# Patient Record
Sex: Female | Born: 1935 | Race: White | Hispanic: No | Smoking: Never smoker
Health system: Southern US, Community
[De-identification: ages and names within clinical notes are randomized; demographics above are authoritative.]

## PROBLEM LIST (undated history)

## (undated) DIAGNOSIS — E119 Type 2 diabetes mellitus without complications: Secondary | ICD-10-CM

## (undated) DIAGNOSIS — E079 Disorder of thyroid, unspecified: Secondary | ICD-10-CM

## (undated) DIAGNOSIS — H409 Unspecified glaucoma: Secondary | ICD-10-CM

## (undated) DIAGNOSIS — I639 Cerebral infarction, unspecified: Secondary | ICD-10-CM

## (undated) DIAGNOSIS — C801 Malignant (primary) neoplasm, unspecified: Secondary | ICD-10-CM

## (undated) DIAGNOSIS — E785 Hyperlipidemia, unspecified: Secondary | ICD-10-CM

## (undated) DIAGNOSIS — E890 Postprocedural hypothyroidism: Secondary | ICD-10-CM

## (undated) DIAGNOSIS — M1990 Unspecified osteoarthritis, unspecified site: Secondary | ICD-10-CM

## (undated) DIAGNOSIS — Z9009 Acquired absence of other part of head and neck: Secondary | ICD-10-CM

## (undated) DIAGNOSIS — I10 Essential (primary) hypertension: Secondary | ICD-10-CM

## (undated) DIAGNOSIS — I4891 Unspecified atrial fibrillation: Secondary | ICD-10-CM

## (undated) DIAGNOSIS — I509 Heart failure, unspecified: Secondary | ICD-10-CM

## (undated) DIAGNOSIS — I499 Cardiac arrhythmia, unspecified: Secondary | ICD-10-CM

## (undated) HISTORY — PX: BREAST SURGERY: SHX581

## (undated) HISTORY — PX: ABDOMINAL HYSTERECTOMY: SHX81

---

## 1998-11-26 ENCOUNTER — Emergency Department (HOSPITAL_COMMUNITY): Admission: EM | Admit: 1998-11-26 | Discharge: 1998-11-26 | Payer: Self-pay | Admitting: Emergency Medicine

## 1999-06-19 ENCOUNTER — Ambulatory Visit (HOSPITAL_COMMUNITY): Admission: RE | Admit: 1999-06-19 | Discharge: 1999-06-19 | Payer: Self-pay | Admitting: Geriatric Medicine

## 1999-12-28 ENCOUNTER — Encounter: Payer: Self-pay | Admitting: Geriatric Medicine

## 1999-12-28 ENCOUNTER — Encounter: Admission: RE | Admit: 1999-12-28 | Discharge: 1999-12-28 | Payer: Self-pay | Admitting: Geriatric Medicine

## 2000-05-06 ENCOUNTER — Other Ambulatory Visit: Admission: RE | Admit: 2000-05-06 | Discharge: 2000-05-06 | Payer: Self-pay | Admitting: Geriatric Medicine

## 2000-12-30 ENCOUNTER — Encounter: Payer: Self-pay | Admitting: Geriatric Medicine

## 2000-12-30 ENCOUNTER — Encounter: Admission: RE | Admit: 2000-12-30 | Discharge: 2000-12-30 | Payer: Self-pay | Admitting: Geriatric Medicine

## 2001-04-09 ENCOUNTER — Emergency Department (HOSPITAL_COMMUNITY): Admission: EM | Admit: 2001-04-09 | Discharge: 2001-04-09 | Payer: Self-pay | Admitting: Emergency Medicine

## 2001-12-31 ENCOUNTER — Encounter: Admission: RE | Admit: 2001-12-31 | Discharge: 2001-12-31 | Payer: Self-pay | Admitting: Geriatric Medicine

## 2001-12-31 ENCOUNTER — Encounter: Payer: Self-pay | Admitting: Geriatric Medicine

## 2003-01-14 ENCOUNTER — Encounter: Payer: Self-pay | Admitting: Geriatric Medicine

## 2003-01-14 ENCOUNTER — Encounter: Admission: RE | Admit: 2003-01-14 | Discharge: 2003-01-14 | Payer: Self-pay | Admitting: Geriatric Medicine

## 2003-08-24 ENCOUNTER — Inpatient Hospital Stay (HOSPITAL_COMMUNITY): Admission: EM | Admit: 2003-08-24 | Discharge: 2003-08-24 | Payer: Self-pay | Admitting: Emergency Medicine

## 2003-08-24 ENCOUNTER — Encounter: Payer: Self-pay | Admitting: Internal Medicine

## 2003-10-12 ENCOUNTER — Ambulatory Visit (HOSPITAL_COMMUNITY): Admission: RE | Admit: 2003-10-12 | Discharge: 2003-10-12 | Payer: Self-pay | Admitting: Cardiology

## 2004-03-03 ENCOUNTER — Emergency Department (HOSPITAL_COMMUNITY): Admission: AD | Admit: 2004-03-03 | Discharge: 2004-03-03 | Payer: Self-pay | Admitting: Internal Medicine

## 2006-03-26 ENCOUNTER — Emergency Department (HOSPITAL_COMMUNITY): Admission: EM | Admit: 2006-03-26 | Discharge: 2006-03-26 | Payer: Self-pay | Admitting: Family Medicine

## 2006-06-18 ENCOUNTER — Ambulatory Visit: Payer: Self-pay | Admitting: Family Medicine

## 2006-06-20 ENCOUNTER — Ambulatory Visit: Payer: Self-pay | Admitting: Family Medicine

## 2006-07-16 ENCOUNTER — Ambulatory Visit: Payer: Self-pay | Admitting: Family Medicine

## 2006-09-15 ENCOUNTER — Ambulatory Visit: Payer: Self-pay | Admitting: Family Medicine

## 2006-12-16 ENCOUNTER — Ambulatory Visit: Payer: Self-pay | Admitting: Family Medicine

## 2006-12-16 LAB — CONVERTED CEMR LAB
AST: 18 units/L (ref 0–37)
Chloride: 104 meq/L (ref 96–112)
GFR calc non Af Amer: 63 mL/min
Glucose, Bld: 133 mg/dL — ABNORMAL HIGH (ref 70–99)
HDL: 54.8 mg/dL (ref >39.0)
Potassium: 4.1 meq/L (ref 3.5–5.1)
Total CHOL/HDL Ratio: 3.4

## 2006-12-27 DIAGNOSIS — E039 Hypothyroidism, unspecified: Secondary | ICD-10-CM | POA: Insufficient documentation

## 2006-12-27 DIAGNOSIS — J309 Allergic rhinitis, unspecified: Secondary | ICD-10-CM | POA: Insufficient documentation

## 2006-12-27 DIAGNOSIS — I10 Essential (primary) hypertension: Secondary | ICD-10-CM | POA: Insufficient documentation

## 2006-12-27 DIAGNOSIS — E785 Hyperlipidemia, unspecified: Secondary | ICD-10-CM | POA: Insufficient documentation

## 2007-02-17 ENCOUNTER — Ambulatory Visit: Payer: Self-pay | Admitting: Family Medicine

## 2007-02-17 LAB — CONVERTED CEMR LAB: Glucose, Bld: 113 mg/dL — ABNORMAL HIGH (ref 70–99)

## 2007-03-19 ENCOUNTER — Ambulatory Visit (HOSPITAL_COMMUNITY): Admission: RE | Admit: 2007-03-19 | Discharge: 2007-03-19 | Payer: Self-pay | Admitting: Specialist

## 2007-04-14 ENCOUNTER — Ambulatory Visit: Payer: Self-pay | Admitting: Family Medicine

## 2007-04-14 ENCOUNTER — Encounter: Payer: Self-pay | Admitting: Family Medicine

## 2007-04-14 ENCOUNTER — Encounter (INDEPENDENT_AMBULATORY_CARE_PROVIDER_SITE_OTHER): Payer: Self-pay | Admitting: Family Medicine

## 2007-04-14 ENCOUNTER — Encounter: Admission: RE | Admit: 2007-04-14 | Discharge: 2007-04-14 | Payer: Self-pay | Admitting: Family Medicine

## 2007-04-14 DIAGNOSIS — M79609 Pain in unspecified limb: Secondary | ICD-10-CM | POA: Insufficient documentation

## 2007-04-15 ENCOUNTER — Telehealth (INDEPENDENT_AMBULATORY_CARE_PROVIDER_SITE_OTHER): Payer: Self-pay | Admitting: *Deleted

## 2007-04-29 ENCOUNTER — Encounter: Admission: RE | Admit: 2007-04-29 | Discharge: 2007-04-29 | Payer: Self-pay | Admitting: Family Medicine

## 2007-05-21 ENCOUNTER — Ambulatory Visit: Payer: Self-pay | Admitting: Family Medicine

## 2007-05-21 DIAGNOSIS — E119 Type 2 diabetes mellitus without complications: Secondary | ICD-10-CM

## 2007-05-21 DIAGNOSIS — I2510 Atherosclerotic heart disease of native coronary artery without angina pectoris: Secondary | ICD-10-CM | POA: Insufficient documentation

## 2007-05-24 LAB — CONVERTED CEMR LAB
ALT: 14 units/L (ref 0–40)
Calcium: 9.3 mg/dL (ref 8.4–10.5)
Chloride: 107 meq/L (ref 96–112)
Cholesterol: 191 mg/dL (ref 0–200)
GFR calc Af Amer: 102 mL/min
Hgb A1c MFr Bld: 6.2 % — ABNORMAL HIGH (ref 4.6–6.0)
TSH: 1.21 microintl units/mL (ref 0.35–5.50)
Total CHOL/HDL Ratio: 3.2
Triglycerides: 173 mg/dL — ABNORMAL HIGH (ref 0–149)

## 2007-05-25 ENCOUNTER — Telehealth (INDEPENDENT_AMBULATORY_CARE_PROVIDER_SITE_OTHER): Payer: Self-pay | Admitting: *Deleted

## 2007-06-10 ENCOUNTER — Ambulatory Visit: Payer: Self-pay | Admitting: Cardiology

## 2007-07-01 ENCOUNTER — Ambulatory Visit: Payer: Self-pay

## 2007-07-01 ENCOUNTER — Encounter: Payer: Self-pay | Admitting: Cardiology

## 2007-07-16 ENCOUNTER — Inpatient Hospital Stay (HOSPITAL_COMMUNITY): Admission: EM | Admit: 2007-07-16 | Discharge: 2007-07-17 | Payer: Self-pay | Admitting: Emergency Medicine

## 2007-07-16 ENCOUNTER — Ambulatory Visit: Payer: Self-pay | Admitting: Internal Medicine

## 2007-07-17 ENCOUNTER — Telehealth (INDEPENDENT_AMBULATORY_CARE_PROVIDER_SITE_OTHER): Payer: Self-pay | Admitting: *Deleted

## 2007-07-20 ENCOUNTER — Telehealth (INDEPENDENT_AMBULATORY_CARE_PROVIDER_SITE_OTHER): Payer: Self-pay | Admitting: *Deleted

## 2007-07-20 ENCOUNTER — Ambulatory Visit: Payer: Self-pay | Admitting: Family Medicine

## 2007-07-20 LAB — CONVERTED CEMR LAB: Rapid Strep: NEGATIVE

## 2007-07-28 ENCOUNTER — Ambulatory Visit: Payer: Self-pay | Admitting: Family Medicine

## 2007-07-28 DIAGNOSIS — R55 Syncope and collapse: Secondary | ICD-10-CM | POA: Insufficient documentation

## 2007-08-06 ENCOUNTER — Ambulatory Visit: Payer: Self-pay | Admitting: Cardiology

## 2007-08-06 ENCOUNTER — Ambulatory Visit: Payer: Self-pay | Admitting: Internal Medicine

## 2007-08-06 ENCOUNTER — Telehealth (INDEPENDENT_AMBULATORY_CARE_PROVIDER_SITE_OTHER): Payer: Self-pay | Admitting: Family Medicine

## 2007-08-06 ENCOUNTER — Observation Stay (HOSPITAL_COMMUNITY): Admission: EM | Admit: 2007-08-06 | Discharge: 2007-08-07 | Payer: Self-pay | Admitting: Emergency Medicine

## 2007-08-10 ENCOUNTER — Ambulatory Visit: Payer: Self-pay | Admitting: Cardiology

## 2007-09-03 ENCOUNTER — Ambulatory Visit: Payer: Self-pay | Admitting: Cardiology

## 2007-09-05 ENCOUNTER — Ambulatory Visit: Payer: Self-pay | Admitting: Cardiology

## 2007-09-17 ENCOUNTER — Ambulatory Visit: Payer: Self-pay | Admitting: Cardiology

## 2008-01-05 ENCOUNTER — Ambulatory Visit: Payer: Self-pay | Admitting: Cardiology

## 2008-01-19 ENCOUNTER — Encounter (INDEPENDENT_AMBULATORY_CARE_PROVIDER_SITE_OTHER): Payer: Self-pay | Admitting: Family Medicine

## 2008-10-30 IMAGING — CR DG CHEST 2V
2 series · 2 of 2 positions shown · non-contrast
Comparison: 03/17/07.

CLINICAL DATA: 88-year-old female, syncope.  No chest complaints.  
 CHEST - 2 VIEW:

[w chest lat]
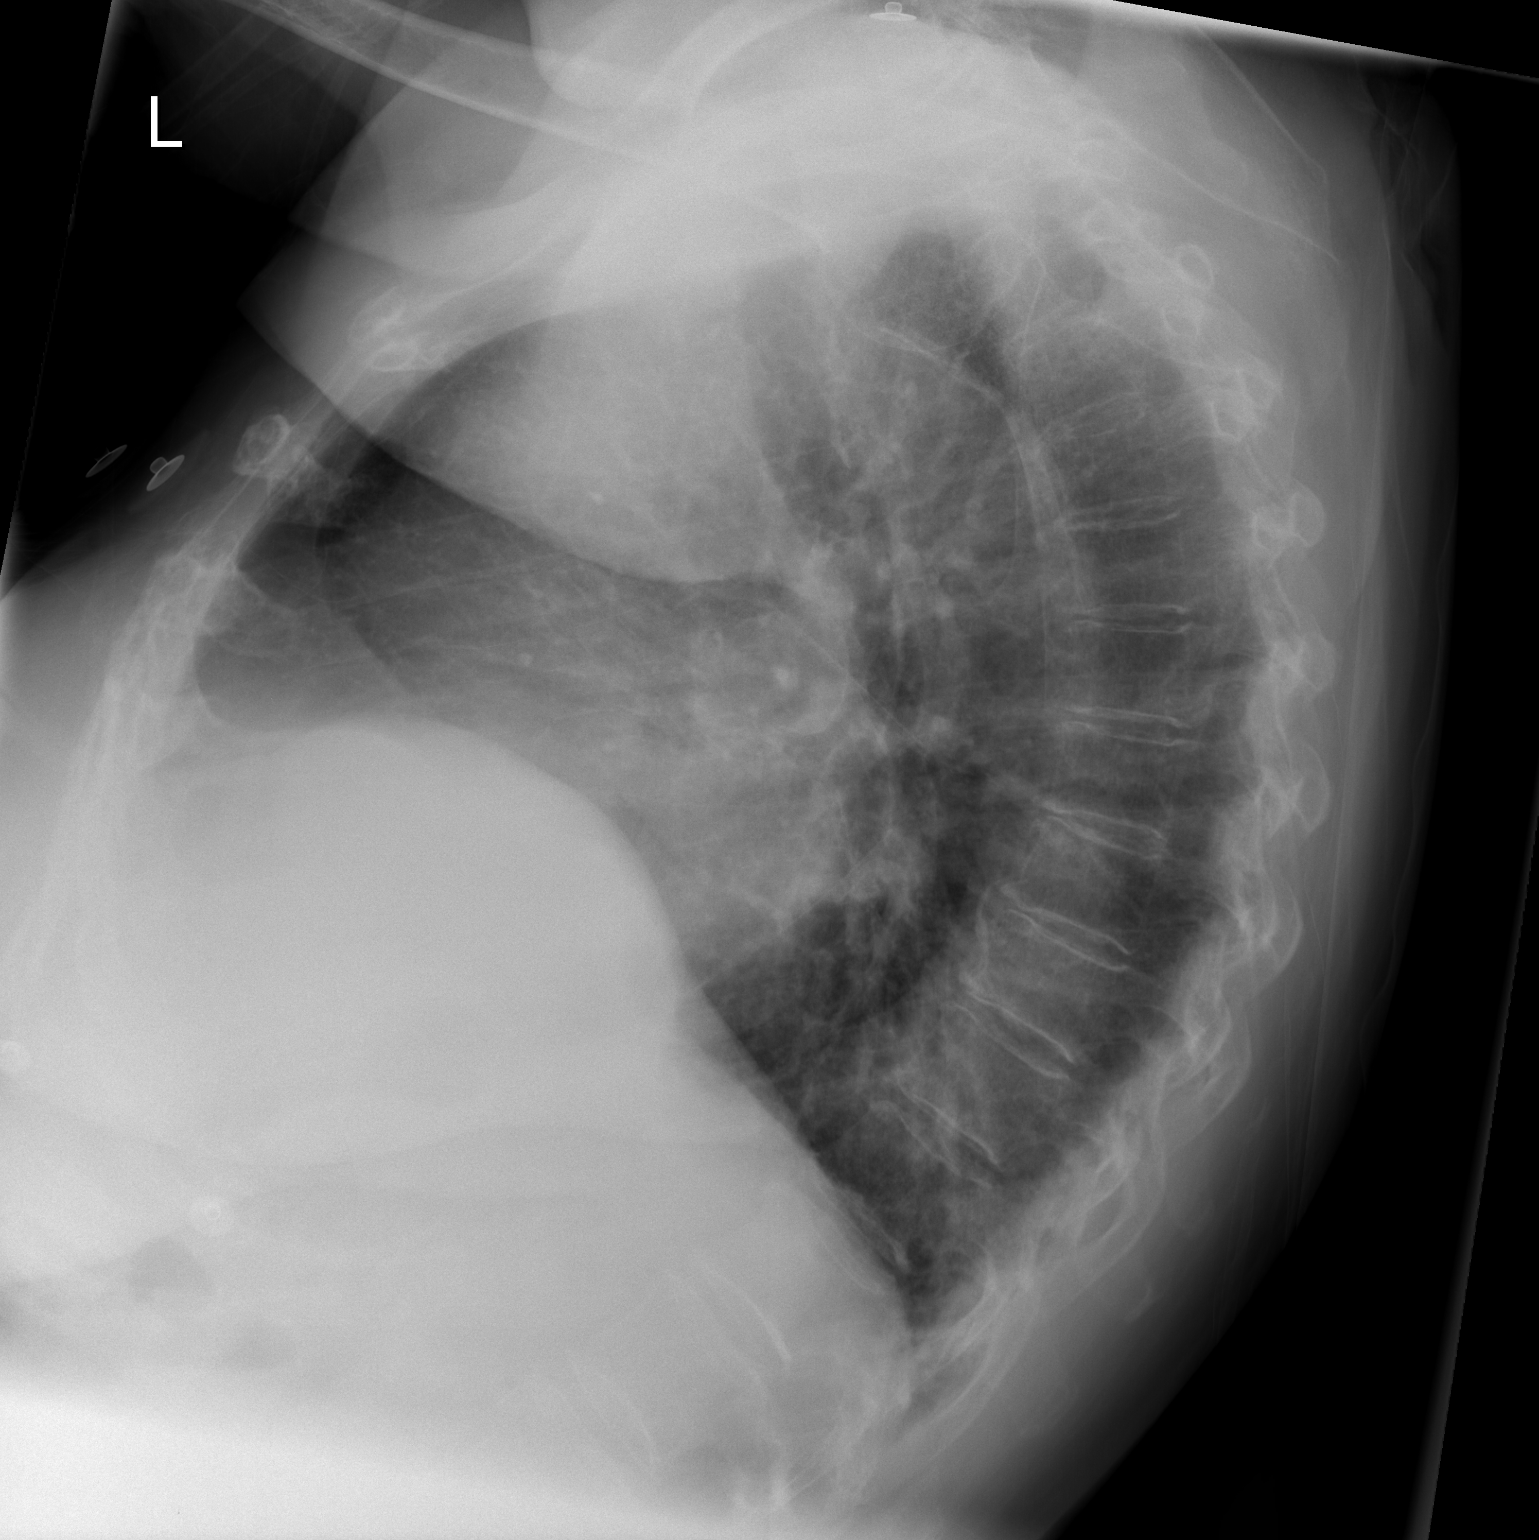

[view not recorded]
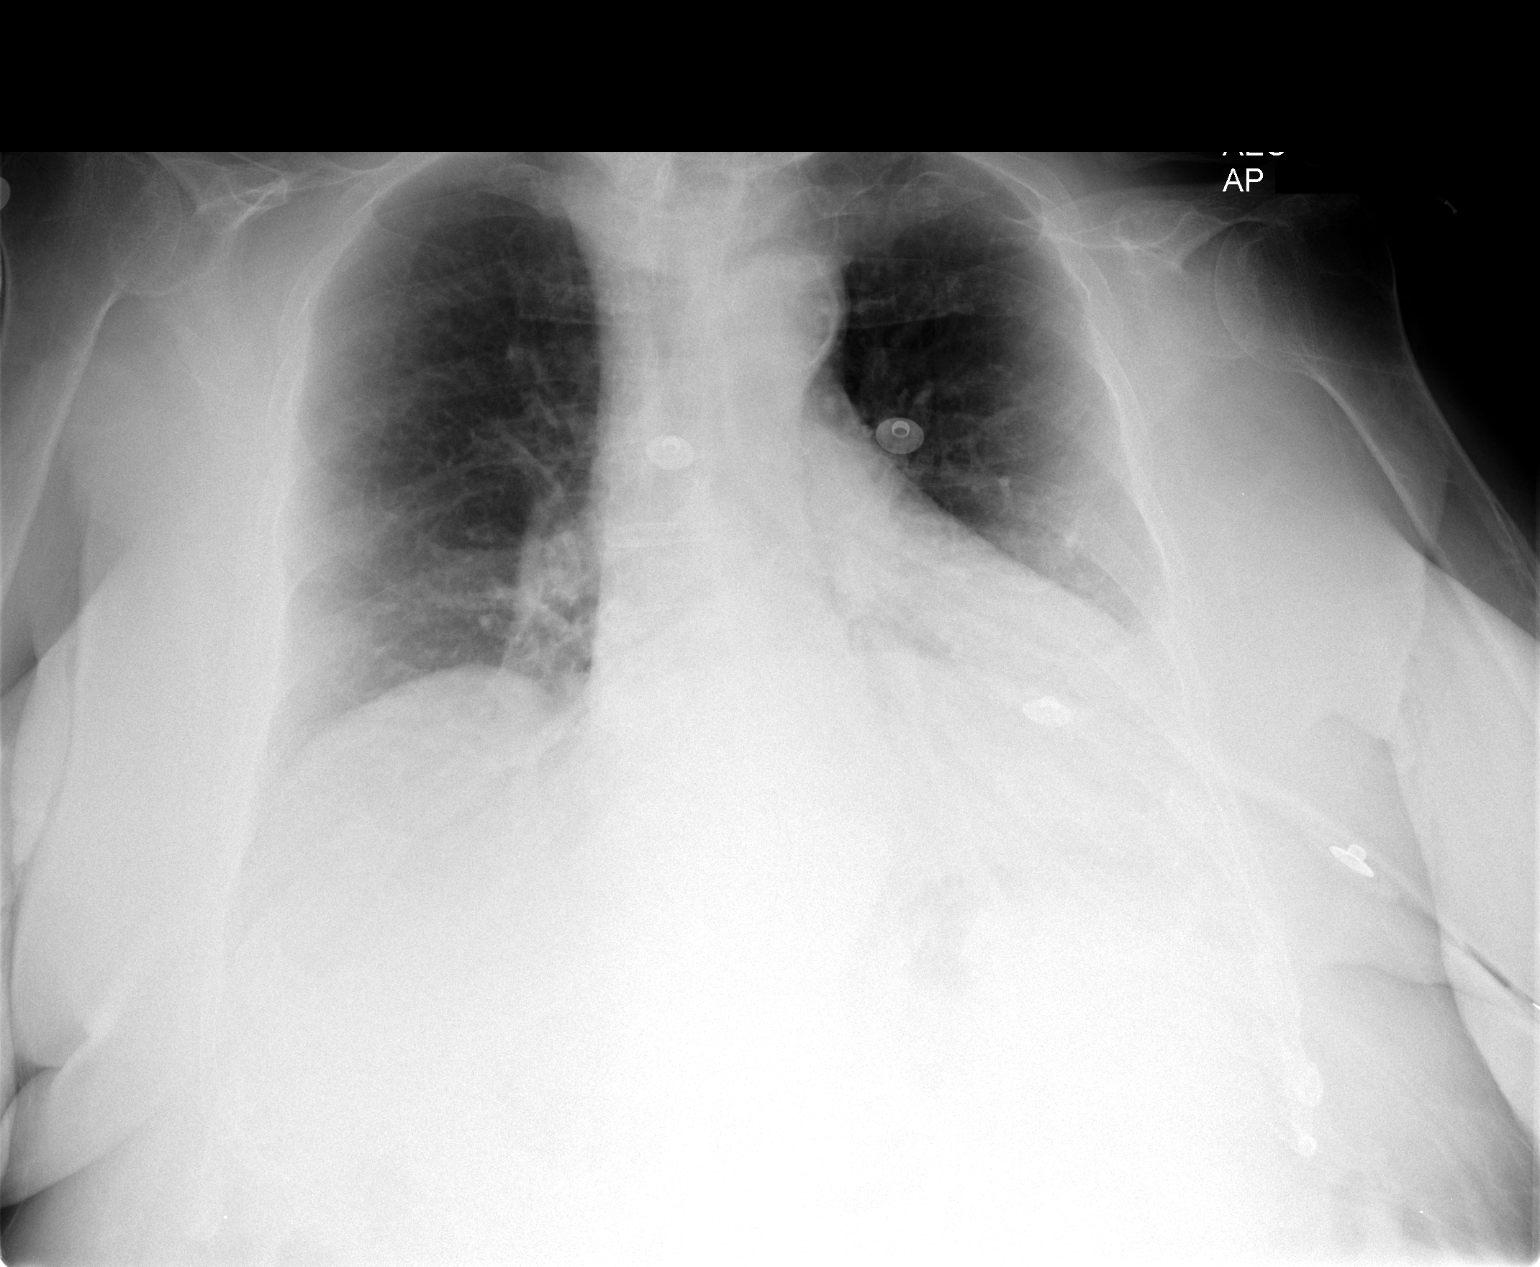

[2 of 2 positions shown; findings below may reference images not displayed]

FINDINGS: The PA view is extremely lordotic.  Heart size is enlarged.  Mild chronic interstitial changes are seen.  Changes of COPD are noted.  No focal airspace disease is evident.
IMPRESSION: 1.  Lordotic chest radiograph without focal airspace disease.  
 2.  Stable cardiomegaly without failure.

## 2010-01-31 ENCOUNTER — Encounter: Admission: RE | Admit: 2010-01-31 | Discharge: 2010-05-01 | Payer: Self-pay | Admitting: Ophthalmology

## 2010-07-14 ENCOUNTER — Emergency Department (HOSPITAL_COMMUNITY): Admission: EM | Admit: 2010-07-14 | Discharge: 2010-07-14 | Payer: Self-pay | Admitting: Emergency Medicine

## 2010-07-23 ENCOUNTER — Emergency Department (HOSPITAL_COMMUNITY)
Admission: EM | Admit: 2010-07-23 | Discharge: 2010-07-23 | Payer: Self-pay | Source: Home / Self Care | Admitting: Emergency Medicine

## 2010-08-22 ENCOUNTER — Emergency Department (HOSPITAL_COMMUNITY): Admission: EM | Admit: 2010-08-22 | Discharge: 2010-08-22 | Payer: Self-pay | Admitting: Emergency Medicine

## 2010-11-19 ENCOUNTER — Emergency Department (HOSPITAL_COMMUNITY)
Admission: EM | Admit: 2010-11-19 | Discharge: 2010-11-19 | Payer: Self-pay | Source: Home / Self Care | Admitting: Emergency Medicine

## 2011-02-11 LAB — CBC
HCT: 41.8 % (ref 36.0–46.0)
MCH: 30.8 pg (ref 26.0–34.0)
MCV: 92.1 fL (ref 78.0–100.0)
WBC: 10.3 10*3/uL (ref 4.0–10.5)

## 2011-02-11 LAB — DIFFERENTIAL
Lymphocytes Relative: 7 % — ABNORMAL LOW (ref 12–46)
Monocytes Relative: 7 % (ref 3–12)

## 2011-02-11 LAB — URINALYSIS, ROUTINE W REFLEX MICROSCOPIC
Bilirubin Urine: NEGATIVE
Glucose, UA: NEGATIVE mg/dL
Hgb urine dipstick: NEGATIVE
Protein, ur: NEGATIVE mg/dL
Specific Gravity, Urine: 1.03 (ref 1.005–1.030)
Urobilinogen, UA: 0.2 mg/dL (ref 0.0–1.0)

## 2011-02-11 LAB — POCT CARDIAC MARKERS
CKMB, poc: <1 ng/mL — ABNORMAL LOW (ref 1.0–8.0)
Myoglobin, poc: 67.5 ng/mL (ref 12–200)
Troponin i, poc: <0.05 ng/mL (ref 0.00–0.09)

## 2011-02-11 LAB — URINE CULTURE
Colony Count: 1000
Culture  Setup Time: 201112200152

## 2011-02-11 LAB — BASIC METABOLIC PANEL
BUN: 19 mg/dL (ref 6–23)
CO2: 26 mEq/L (ref 19–32)
Creatinine, Ser: 0.73 mg/dL (ref 0.4–1.2)
GFR calc Af Amer: >60 mL/min (ref >60)
Potassium: 4.1 mEq/L (ref 3.5–5.1)

## 2011-02-11 LAB — URINE MICROSCOPIC-ADD ON

## 2011-02-14 LAB — DIFFERENTIAL
Basophils Absolute: 0.1 10*3/uL (ref 0.0–0.1)
Basophils Relative: 1 % (ref 0–1)
Basophils Relative: 1 % (ref 0–1)
Eosinophils Absolute: 0.2 10*3/uL (ref 0.0–0.7)
Eosinophils Absolute: 0.2 10*3/uL (ref 0.0–0.7)
Eosinophils Relative: 2 % (ref 0–5)
Eosinophils Relative: 2 % (ref 0–5)
Lymphocytes Relative: 25 % (ref 12–46)
Monocytes Absolute: 0.6 10*3/uL (ref 0.1–1.0)
Monocytes Absolute: 0.8 10*3/uL (ref 0.1–1.0)
Monocytes Relative: 8 % (ref 3–12)

## 2011-02-14 LAB — CBC
HCT: 42.1 % (ref 36.0–46.0)
HCT: 39.8 % (ref 36.0–46.0)
Hemoglobin: 14.1 g/dL (ref 12.0–15.0)
MCH: 30.6 pg (ref 26.0–34.0)
MCH: 30.3 pg (ref 26.0–34.0)
MCHC: 33.5 g/dL (ref 30.0–36.0)
MCHC: 33.7 g/dL (ref 30.0–36.0)
MCV: 91.3 fL (ref 78.0–100.0)
MCV: 90 fL (ref 78.0–100.0)
Platelets: 178 10*3/uL (ref 150–400)
RDW: 13 % (ref 11.5–15.5)

## 2011-02-14 LAB — BASIC METABOLIC PANEL
BUN: 15 mg/dL (ref 6–23)
CO2: 24 mEq/L (ref 19–32)
Chloride: 109 mEq/L (ref 96–112)
Creatinine, Ser: 0.64 mg/dL (ref 0.4–1.2)
Glucose, Bld: 112 mg/dL — ABNORMAL HIGH (ref 70–99)
Potassium: 3.7 mEq/L (ref 3.5–5.1)

## 2011-02-14 LAB — URINE CULTURE: Culture  Setup Time: 201108222324

## 2011-02-14 LAB — URINALYSIS, ROUTINE W REFLEX MICROSCOPIC
Bilirubin Urine: NEGATIVE
Bilirubin Urine: NEGATIVE
Glucose, UA: NEGATIVE mg/dL
Hgb urine dipstick: NEGATIVE
Hgb urine dipstick: NEGATIVE
Ketones, ur: NEGATIVE mg/dL
Ketones, ur: NEGATIVE mg/dL
Nitrite: NEGATIVE
Nitrite: NEGATIVE
Specific Gravity, Urine: 1.007 (ref 1.005–1.030)
Specific Gravity, Urine: 1.011 (ref 1.005–1.030)
Urobilinogen, UA: 0.2 mg/dL (ref 0.0–1.0)
pH: 5 (ref 5.0–8.0)

## 2011-02-14 LAB — POCT CARDIAC MARKERS
CKMB, poc: <1 ng/mL — ABNORMAL LOW (ref 1.0–8.0)
Myoglobin, poc: 62.1 ng/mL (ref 12–200)
Troponin i, poc: <0.05 ng/mL (ref 0.00–0.09)

## 2011-02-14 LAB — URINE MICROSCOPIC-ADD ON

## 2011-02-14 LAB — POCT I-STAT, CHEM 8
HCT: 42 % (ref 36.0–46.0)
Hemoglobin: 14.3 g/dL (ref 12.0–15.0)
Sodium: 142 mEq/L (ref 135–145)

## 2011-02-14 LAB — PROTIME-INR: Prothrombin Time: 23.1 seconds — ABNORMAL HIGH (ref 11.6–15.2)

## 2011-02-15 LAB — CBC
HCT: 41.4 % (ref 36.0–46.0)
Hemoglobin: 14.1 g/dL (ref 12.0–15.0)
MCH: 31.2 pg (ref 26.0–34.0)
RBC: 4.53 MIL/uL (ref 3.87–5.11)

## 2011-02-15 LAB — PROTIME-INR: INR: 1.89 — ABNORMAL HIGH (ref 0.00–1.49)

## 2011-02-15 LAB — DIFFERENTIAL
Eosinophils Absolute: 0.2 10*3/uL (ref 0.0–0.7)
Lymphs Abs: 1.7 10*3/uL (ref 0.7–4.0)
Monocytes Absolute: 0.6 10*3/uL (ref 0.1–1.0)
Monocytes Relative: 7 % (ref 3–12)
Neutro Abs: 5.5 10*3/uL (ref 1.7–7.7)
Neutrophils Relative %: 69 % (ref 43–77)

## 2011-02-15 LAB — BASIC METABOLIC PANEL
CO2: 24 mEq/L (ref 19–32)
Calcium: 8.7 mg/dL (ref 8.4–10.5)
GFR calc Af Amer: >60 mL/min (ref >60)
Glucose, Bld: 145 mg/dL — ABNORMAL HIGH (ref 70–99)
Potassium: 4 mEq/L (ref 3.5–5.1)
Sodium: 141 mEq/L (ref 135–145)

## 2011-04-16 NOTE — Assessment & Plan Note (Signed)
[facility]                            CARDIOLOGY OFFICE NOTE   Kathleen Mccarty, Kathleen Mccarty                  MRN:          [id number]  DATE:09/03/2007                            DOB:          January 27, 1918    PRIMARY CARE PHYSICIAN:  Dr. Leanne Chang.   REASON FOR VISIT:  Followup CardioNet monitor.   HISTORY OF PRESENT ILLNESS:  I brought Kathleen Mccarty back to the office  somewhat early as her CardioNet monitor has detected episodes of rapid  paroxysmal atrial fibrillation with heart rates up into the 160s and  170s at times.  It is interesting to note that Kathleen Mccarty has been  completely asymptomatic with this.  Our main suspicion was that she  actually may have been having episodes of bradycardia or pauses leading  to her prior syncope although this has not been the case.  She has not  had a frank syncopal event.  She is also off of her Toprol XL which she  had been previously taking at 50 mg daily.  At the present time the  patient's CardioNet device has malfunctioned and she is getting a new  one through the mail over the next few days.  I have asked her to resume  her Toprol XL at prior dose once she gets her monitor so that we can  document her heart rate variability at that time and ensure that she  does not have any marked bradycardia on the beta blocker.  She is on  Ticlid at this time and I would fairly leery about placing her on  Coumadin, particularly with her episodes of syncope given an increased  risk of bleeding complications.  She was of the same mind.   ALLERGIES:  CODEINE, IODINE, SEAFOOD, EGGS.   PRESENT MEDICATIONS:  1. Paxil CR 12.5 mg p.o. daily.  2. Imdur 60 mg p.o. daily.  3. Simvastatin 40 mg p.o. nightly.  4. Synthroid 125 mcg p.o. daily.  5. Ticlid 250 mg p.o. b.i.d.  6. Travatan eye drops.  7. Multivitamin daily.  8. Tylenol Extra Strength p.r.n.   REVIEW OF SYSTEMS:  As described in the history of present  illness.   EXAMINATION:  Blood pressure is 198/90 today, heart rate is 72 and  regular, weight is 179 pounds.  Patient is in no acute distress.  NECK:  Reveals no elevated jugular venous pressure or loud bruits.  LUNGS:  Clear without labored breathing.  CARDIAC:  Reveals a regular rate and rhythm, soft basal systolic murmur  is noted, no S3 gallop.  EXTREMITIES:  Exhibit no significant pitting edema.   IMPRESSION AND RECOMMENDATION:  1. Paroxysmal atrial fibrillation with rapid ventricular response,      newly diagnosed.  It is not entirely clear that this is symptom      provoking in speaking with the patient.  She has had no syncope and      no sense of palpitations, chest pain or marked dyspnea.  I have      asked her to resume her Toprol XL at 50 mg daily once she receives  her CardioNet device and then we can document her heart rate      variability on beta blocker therapy.  I am leery about initiating      any Coumadin in her case given her history of syncope and      propensity to falls with bleeding complications.  We could always      review this later.  I will have her come back to the office over      the next few weeks and review her progress.  It is still not      entirely clear regarding the etiology of her prior episodes of      syncope.  2. Further plans to follow.     Jonelle Sidle, MD     SGM/MedQ  DD: 09/03/2007  DT: 09/04/2007  Job #: [id number]   cc:   Leanne Chang, M.D.

## 2011-04-16 NOTE — Discharge Summary (Signed)
Kathleen Mccarty, Kathleen Mccarty NO.:  [id number]   MEDICAL RECORD NO.:  [id number]          PATIENT TYPE:  INP   LOCATION:  1403                         FACILITY:  [facility]   PHYSICIAN:  Rosalyn Gess. Norins, MD  DATE OF BIRTH:  Apr 10, 1918   DATE OF ADMISSION:  07/15/2007  DATE OF DISCHARGE:  07/17/2007                               DISCHARGE SUMMARY   ADMITTING DIAGNOSES:  1. Syncope.  2. Glaucoma.  3. Hypothyroid disease.  4. Depression.   DISCHARGE DIAGNOSES:  1. Syncope.  2. Glaucoma.  3. Hypothyroid disease.  4. Depression.   CONSULTANTS:  None.   PROCEDURES:  1. CT scan of the brain performed August 13, showed no intracranial      abnormality with mild white matter changes.  2. Chest x-ray performed August 13, with lordotic chest x-ray without      focal air space disease, stable cardiomegaly without failure.  3. EEG with results pending at time of discharge dictation.   HISTORY OF PRESENT ILLNESS:  The patient is 77 year old woman who had an  episode of a syncopal drop attack several years ago.  Her workup at that  time was unremarkable and by her report, this included imaging studies  and EEG.  The patient was enjoying her usual state of health on the day  of admission.  She had gone upstairs and all of a sudden lost  consciousness without warning.  She is not sure how long she was  unconscious.  She was too weak to stand when she awoke and had to crawl  to the phone.  Her daughter was called and the paramedics were called  and the patient was brought to the emergency department for evaluation.   PAST MEDICAL HISTORY:  Well-documented in the admission note by Dr.  Rito Ehrlich.   HOSPITAL COURSE:  Problem 1.  CARDIOVASCULAR:  The patient was admitted  for observation to a telemetry unit.  Over 24 hours, she had no  arrhythmias and held a sinus rhythm.  The patient had no further  syncopal events.  She did have orthostatic vital signs checked and did  have  about a 20 point drop in blood pressure going from sitting to  standing.  She reports she was asymptomatic at that time.  Of note, when  the patient had her syncopal episode, there was no loss of control of  bowel or bladder.   With the patient being stable, with no evidence of arrhythmia on  telemetry and with EGD being performed at this point, she is ready for  discharge home and to have outpatient followup.  She may be a candidate  for an event recorder or implantable loop recorder.   Problem 2.  HYPOTHYROID DISEASE:  Stable.   Problem 3.  HYPERLIPIDEMIA:  Stable with a cholesterol of 157, HDL 48,  LDL 87.  Cardiac enzymes were negative x3.   Problem 4.  FEVERS:  The patient did have a fever spike.  UA was done at  that time which came back as negative.  A white count at that time was  3800.  The patient was given  one dose of Rocephin at 1 g IV.  With no  source of infection, we will not continue antibiotics at time of  discharge.   DISCHARGE PHYSICAL EXAMINATION:  VITAL SIGNS:  Temperature of 98.1,  blood pressure 135/58, pulse was 66, respirations 18, O2 saturations 92-  96% on room air.  GENERAL:  This is an elderly, overweight, Caucasian  woman lying in bed in no acute distress.  HEENT:  Exam was unremarkable.  NECK:  Supple.  CHEST:  The patient is moving air well.  No rales, wheezes or rhonchi  are noted.  CARDIOVASCULAR:  2+ radial pulses.  She had a quiet precordium with  regular rate and rhythm.  ABDOMEN:  Obese, nontender with positive bowel sounds.   DISCHARGE MEDICATIONS:  1. Ticlid 250 mg b.i.d.  2. Synthroid 125 mcg daily.  3. Zocor 40 mg nightly.  4. Imdur 60 mg daily.  5. Travatan eye drops daily.  6. Cosopt eye drops t.i.d.  7. Alphagan eye drops b.i.d.  8. Zyrtec 10 mg daily.  9. Toprol 15 mg daily.  10.Paxil CR 12.5 mg daily.   DISPOSITION:  The patient is discharged home.   FOLLOW UP:  She is to see Dr. Rito Ehrlich in followup who can help complete   outpatient evaluation with possible event recorder as noted.      Rosalyn Gess Norins, MD  Electronically Signed     MEN/MEDQ  D:  07/17/2007  T:  07/17/2007  Job:  [id number]   cc:   Leanne Chang, M.D.  Fax: [phone]   Madolyn Frieze. Jens Som, MD, [facility]  [address]  [address]  [place]  [place] [address]

## 2011-04-16 NOTE — Assessment & Plan Note (Signed)
[facility]                            CARDIOLOGY OFFICE NOTE   Kathleen Mccarty, Kathleen Mccarty                     MRN:          [id number]  DATE:01/05/2008                            DOB:          [birth year]    PRIMARY CARE PHYSICIAN:  Leanne Chang, M.D.   REASON FOR VISIT:  Follow-up, paroxysmal atrial fibrillation.   HISTORY OF PRESENT ILLNESS:  Kathleen Mccarty comes in for a 37-month visit.  She states that she has had no episodes of syncope and has had no major  palpitations.  She reports tolerating her Toprol XL.  She is having no  bleeding problems and continues on long-term Ticlid (started prior to  follow-up with me).  We have discussed Coumadin in the past and she  prefers to hold off on this medication for now.  She has had no major  falls.  She also apparently has an in-home assistant that comes most  days of the week for several hours.  She states that her son is helping  her to establish with a gerontologist through [facility] in [place].   ALLERGIES:  CODEINE, IODINE, SEAFOOD, EGGS.   PRESENT MEDICATIONS:  1. Paxil CR 12.5 mg p.o. daily.  2. Imdur 60 mg p.o. daily.  3. Simvastatin 40 mg p.o. nightly.  4. Synthroid 125 mcg p.o. daily.  5. Ticlid 250 mg p.o. b.i.d.  6. Travatan eye drops.  7. Multivitamin daily.  8. Toprol XL 50 mg p.o. daily.  9. Tylenol Extra Strength p.r.n.   REVIEW OF SYSTEMS:  As described in the history of present illness.  No  bleeding problems.  No frank syncope.  Otherwise systems negative.   EXAMINATION:  Blood pressure today is 161/77, heart rate is 67, weight  is 184, pounds up from 178.  Patient in no acute distress.  NECK:  No loud bruits.  No thyromegaly.  LUNGS:  Clear.  Somewhat diminished breath sounds.  No wheezing.  CARDIAC:  A regular rate and rhythm.  A soft systolic murmur at the  base.  Preserved second heart sound.  No pericardial rub or S3 gallop.  EXTREMITIES:  Venous stasis.  No frank pitting edema.  Some varicosities  noted.   IMPRESSION AND RECOMMENDATIONS:  1. Paroxysmal atrial fibrillation with documented rapid ventricular      response by CardioNet monitor.  No episodes of significant      bradycardia or pauses, however.  She continues on Toprol XL and is      symptomatically stable with no dizziness, syncope or palpitations.      We had discussed Coumadin in the past but given her present regimen      including Ticlid, she has very reasonably preferred to hold off on      this given increased bleeding risk.  From a cardiac perspective,      she has no major indication for Ticlid.  It would seem to me that      if this medicine were discontinued along the way, Coumadin would be      a very reasonable option for  her.  She plans to establish with a      gerontologist through [facility].  We will plan to follow her along over the next 6 months.  2. Further plans to follow.     Jonelle Sidle, MD  Electronically Signed    SGM/MedQ  DD: 01/05/2008  DT: 01/06/2008  Job #: [id number]
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
## 2011-04-16 NOTE — Discharge Summary (Signed)
NAMEANALIS, DISTLER            ACCOUNT NO.:  [id number]   MEDICAL RECORD NO.:  [id number]          PATIENT TYPE:  OBV   LOCATION:  1415                         FACILITY:  [facility]   PHYSICIAN:  Barbette Hair. Artist Pais, DO      DATE OF BIRTH:  11-21-1918   DATE OF ADMISSION:  08/06/2007  DATE OF DISCHARGE:  08/07/2007                               DISCHARGE SUMMARY   DISCHARGE DIAGNOSES:  1. Near-syncope of unclear etiology.  2. Hypertension.  3. Type 2 diabetes.  4. Hyperlipidemia.  5. Sinus bradycardia.   DISCHARGE MEDICATIONS:  1. Paxil CR 12.5 mg once daily.  2. Isosorbide nitrate 60 mg once daily.  3. Simvastatin 40 mg q.h.s.  4. Synthroid 125 mcg once daily.  5. Ticlid 250 mg twice daily.  6. Travatan eye drops as directed.  7. Multivitamin once daily.  8. The patient is to stop her Toprol.   FOLLOWUP INSTRUCTIONS:  She has an appointment with her cardiologist,  Dr. Diona Browner, August 10, 2007.  She was instructed to call Dr.  Laqueta Linden office for followup appointment within 2 weeks.   HOSPITAL COURSE:  The patient is an 77 year old white female with  previous syncopal event, presents with lightheadedness and presyncope.  She experienced above symptoms with urination.  The EMS noted a heart  rate in the 50s.  She has been previously evaluated by [facility]  Cardiologists with extensive workup including Holter monitors,  treadmill, and echocardiograms.  There has been no definite diagnosis.  She was evaluated by Dr. Diona Browner in July of 2008, who performed a  repeat 2 D echocardiogram that noted normal left ventricular function  and no valvular abnormalities.   Due to her bradycardia, her Toprol was discontinued.  She was seen by  Dr. Myrtis Ser regarding possible need for pacemaker.  He felt that her  bradycardia was not related to her symptoms and did not see the  indication for pacemaker at this time.  He recommended discontinuing her  beta blocker and letting her blood pressure  remain on the high side.   She was evaluated by physical therapy who felt that the patient was safe  for discharge home.  She is to continue using her cane with ambulation  and wear compression stockings.      Barbette Hair. Artist Pais, DO  Electronically Signed     RDY/MEDQ  D:  08/07/2007  T:  08/08/2007  Job:  [id number]   cc:   Leanne Chang, M.D.  Fax: [phone]

## 2011-04-16 NOTE — H&P (Signed)
NAMEMARGARITA, Mccarty NO.:  [id number]   MEDICAL RECORD NO.:  [id number]          PATIENT TYPE:  INP   LOCATION:  0101                         FACILITY:  [facility]   PHYSICIAN:  Hollice Espy, M.D.DATE OF BIRTH:  Apr 26, 1918   DATE OF ADMISSION:  07/15/2007  DATE OF DISCHARGE:                              HISTORY & PHYSICAL   PRIMARY CARE PHYSICIAN:  Dr. Leanne Chang.   CHIEF COMPLAINT:  Syncope.   HISTORY OF PRESENT ILLNESS:  The patient is an 77 year old white female  with past medical history of hyperlipidemia, hypertension, and glaucoma  who has been previously well.  Approximately several years ago, she had  an episode of a syncopal drop attack with no previous warning.  She had  a workup at that time that was unremarkable.  She has had no episode  since and then today she said she was feeling fine, she went upstairs  and all of a sudden she passed out with no warning.  She is unsure for  how long she was out for.  She felt it was in minutes, perhaps even  close to a half hour or so.  When she woke up, she was so weak she could  not stand.  She said she denied any pain or focal weakness, more of a  generalized weakness, but she was able to slide over to the phone and  call her daughter who called for paramedics.  The patient otherwise said  she has been fine.  She said she noted a recent cold in the past, a URI  with mild laryngitis the last several days prior but other than that no  real major complaint.  She was brought to the emergency room.  Generalized weakness continued to the point where she still feels like  she cannot stand without assistance but denies any headaches, vision  changes, dysphagia, chest pain, palpitations, shortness of breath,  wheezing, coughing, abdominal pain, hematuria, dysuria, constipation,  diarrhea, focal extremity numbness, weakness, or pain.  Review of  systems is otherwise negative.   PAST MEDICAL HISTORY:   Hypothyroidism, hyperlipidemia, hypertension,  glaucoma, and depression.   MEDICATIONS:  She is on Ticlid 250 p.o. b.i.d., Synthroid 125 mcg p.o.  daily, Zocor 40 p.o. at bedtime, Imdur 60 p.o. daily, Travatan 5 mL one  drop each eye daily, Cosopt 10 mL right eye t.i.d., Alphagan 15 mL right  eye b.i.d., Zyrtec 10 daily, Toprol 50 daily, and Paxil  12.5 daily.   ALLERGIES:  She has allergies to iodine and codeine.   SOCIAL HISTORY:  She denies any tobacco, alcohol, or drug use.   FAMILY HISTORY:  Noncontributory.   PHYSICAL EXAMINATION:  VITAL SIGNS ON ADMISSION:  Temperature 100.1.  Heart rate 69.  Blood pressure 109/62.  Respirations 18.  O2 sat 98% on  3 liters.  GENERAL:  The patient is alert and oriented x3 in no apparent distress.  HEENT:  Normocephalic, atraumatic.  Her mucous membranes are dry.  She  has no carotid bruits.  Cranial nerves II-XII appear to be intact.  She  has a very possibly subjective right eyelid  that is a mild droop but not  full but otherwise really unremarkable in terms of cranial nerves exam.  HEART:  Regular rate and rhythm.  S1/S2.  LUNGS:  Clear to auscultation bilaterally.  ABDOMEN:  Soft, nontender, nondistended.  Positive bowel sounds.  EXTREMITIES:  No clubbing, cyanosis or edema.  MUSCULOSKELETAL:  Lower extremities are equal and about a 5 to a 5-  symmetrically.  Upper extremities flexion and extension are about equal.  Her grip possibly on the left side is slightly weaker than the right but  again it is very minimal at best.  She has no other neurological  findings.   LABORATORY WORK:  CT scan of the head shows age-related changes.  White  count 4.9, hemoglobin and hematocrit 13.7 and 39, MCV 90, platelet count  144, 79% neutrophils.  Sodium 140, potassium 4.1, chloride 107, bicarb  25, BUN 11, creatinine 0.8, glucose 161.  Liver function tests are  notable for an albumin of 3.3.  UA is unremarkable.  CPK 30, MB 1.1,  troponin is less than  0.02.  I have ordered coags which are pending.  Her chest x-ray shows no acute abnormalities.   ASSESSMENT AND PLAN:  1. Drop attack.  Etiology is unknown.  The patient does not look to be      dehydrated.  No signs of orthostasis.  Her EKG also notes a normal      sinus rhythm, low voltage changes but no signs of an etiology.  I      neglected to mention on her exam on coming in that when she was      unconscious she could not recall how long she was unconscious for      but she did lose control of her bowels.  Therefore, I am concerned      about the remote possibility of a seizure.  Since she was alone      when she woke up, it is unclear if she had a postictal state.  Will      order an EEG for evaluation of possible seizure.  I will continue      her other medications holding on her Toprol and to ensure that her      blood pressure is improved.  2. Glaucoma.  Continue eye drops.  3. Hypothyroidism.  Continue Synthroid.  4. Depression.  Continue Paxil.      Hollice Espy, M.D.  Electronically Signed     SKK/MEDQ  D:  07/15/2007  T:  07/16/2007  Job:  [id number]   cc:   Leanne Chang, M.D.  Fax: [phone]

## 2011-04-16 NOTE — Consult Note (Signed)
NAMESERENAH, MILL NO.:  [id number]   MEDICAL RECORD NO.:  [id number]          PATIENT TYPE:  OBV   LOCATION:  1415                         FACILITY:  [facility]   PHYSICIAN:  Luis Abed, MD, FACCDATE OF BIRTH:  Jun 24, 1918   DATE OF CONSULTATION:  08/07/2007  DATE OF DISCHARGE:                                 CONSULTATION   Kathleen Mccarty has had recurrent near syncope over the years. The exact  etiology remains unclear. She is currently admitted. Her heart rate is  running between 55 and 60. Her beta blocker has been put on hold. We  were asked to see her for her bradycardia to see if this is playing a  role with her symptoms.   The patient has been assessed previously as an outpatient by Dr. Simona Huh on June 10, 2007. At that time, all of the prior information  from the past was not available to him. However, he noted that she has  had presyncopal spells in the past. She has been evaluated by Dr.  Amil Amen and Dr. Mayford Knife of [facility] due to syncope in the past.  It is our understanding that she has echocardiograms, Holter monitors,  treadmills and tilt-table tests over the years, and there is no definite  diagnosis. Her spells occur when she stands up suddenly. Her daughter  tells me today that this seems to occur more frequently when she stands  up rapidly after eating. When Dr. Diona Browner saw her, he was trying to  obtain old information. He did obtain a followup 2-dimensional  echocardiogram in July of 2008, and it showed normal left ventricular  function and no major valvular abnormalities.   The current admission is again compatible with some type of dizziness  and near syncope on her part. Her Toprol has been held. Labs have not  shown any major abnormalities.   PAST MEDICAL HISTORY:  ALLERGIES:  IODINE AND CODEINE. THERE IS QUESTION  OF ALLERGY TO AMOXICILLIN, CEPHALOSPORINS, CODEINE, IODINE AND  SHELLFISH.   MEDICATIONS INCLUDE:  1.  Ticlid.  2. Synthroid.  3. Zocor.  4. Imdur.  5. Travatan.  6. Toprol-XL  7. Paxil.  8. Multivitamin.   OTHER MEDICAL PROBLEMS:  See the list below.   SOCIAL HISTORY:  The patient is widowed. She lives [place] her [place] in  town. 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 She does not smoke.   FAMILY HISTORY:  Family history reveals that there is no strong family  history of coronary disease.   REVIEW OF SYSTEMS:  Today, she feels well. She says she feels better  than she did on admission. She is not having any significant GI or GU  symptoms. She is wearing support hose. Otherwise, her review of systems  is negative.   PHYSICAL EXAMINATION:  Blood pressure today is 160/70. When standing, it  is 150/70. Her pulse ranges from 55 to 60. The patient is oriented to  person, time and place. Affect is normal. Her daughter is in the room at  the time of the evaluation.  HEENT:  Reveals no xanthelasma. She  has normal extraocular motion. There  are no carotid bruits. There is no jugular venous distention.  LUNGS:  Are clear. Respiratory effort is not labored.  CARDIAC EXAM:  Reveals a S1 with a S2. There are no clicks or  significant murmurs.  Her abdomen is obese but soft. She has no masses or bruits. She has no  significant peripheral edema.   EKG reveals sinus rhythm to sinus bradycardia with rates ranging from 55-  60. Her labs have shown a potassium of 4, BUN of 13, creatinine 0.76.  Her troponin is normal.   Her telemetry has shown sinus rhythm to sinus bradycardia.   PROBLEMS:  Include:  1. History of syncope and presyncope over many years. Etiology remains      unclear. I do not think it is related to her current relative      bradycardia. I feel that pacemaker is not indicated at this point.      I would suggest having her continue to wear her support hose and      keep her off of a beta blocker. It would probably be helpful to let      her blood pressure run on the higher  side. Also, she needs to be      instructed to always stand up slowly when getting up from a sitting      position, particularly after she eats.  2. Status post hysterectomy.  3. History of thyroidectomy on replacement.  4. History of glaucoma.  5. Hyperlipidemia.  6. Hypertension.  7. Diabetes.  8. Sinus bradycardia. I believe this is not the basis of her problem.      It is possible that she could have chronotropic incompetence.      Consideration could be given to having her walk in the office or on      the treadmill to see her heart rate response. However, her symptoms      do not appear to occur when exercising but rather when standing up.      I have recommended no further cardiac workup here in the hospital.      A CardioNet monitor might be helpful. However, it would be helpful      to know when her last monitors were done. The patient is encouraged      to keep her appointment with Dr. Diona Browner next Monday if she is      home by them.      Luis Abed, MD, [facility]  Electronically Signed     JDK/MEDQ  D:  08/07/2007  T:  08/07/2007  Job:  [id number]   cc:   Jonelle Sidle, MD  [address]  [place], [place] [address]

## 2011-04-16 NOTE — Assessment & Plan Note (Signed)
[facility]                            CARDIOLOGY OFFICE NOTE   Kathleen, Mccarty                     MRN:          [id number]  DATE:08/10/2007                            DOB:          [birth year]    PRIMARY CARE PHYSICIAN:  Dr. Leanne Chang.   REASON FOR VISIT:  Recurrent syncope.   HISTORY OF PRESENT ILLNESS:  I saw Kathleen Mccarty back in July in referral  for a history of previous syncope as well as chronic dyspnea on  exertion.  Her history is detailed in my previous note.  I have seen  records from her prior cardiology evaluation by [facility] and no  clear evidence of CAD was reported, and she also had a negative tilt  table test.  Her interval history includes admission to the hospital  apparently on 2 occasions for an episode of syncope approximately 3  weeks ago and near syncope more recently.  She reports that these  episodes occurred suddenly.  The initial episode occurred when she was  walking to her bedroom.  She states that she woke up on the floor,  cannot recall any precipitating factors, no chest pain and no change in  her breathing status.  She apparently had no dysrhythmias uncovered  during that initial assessment nor with her more recent admission.  She  is now off of Toprol, having been relatively bradycardic, although  around 55-60 and without any clear pauses on telemetry monitoring.  Her  electrocardiogram today shows sinus rhythm at 72 bpm with inferolateral  Q waves.  Notably, she had an echocardiogram obtained after my initial  visit which was fairly reassuring, showing an ejection fraction of 55-  60% with no diagnostic regional wall motion abnormalities.  She did have  a mildly calcified aortic valve with mild decreased cusp excursion  consistent with mild aortic stenosis and also evidence of mild mitral  regurgitation.  No obviously clinically significant valvular lesions  were noted.  I spoke with Kathleen Mccarty  today about wearing a 30-day event  recorder to hopefully try and document her heart rhythm were she to have  a recurrent episode.  The possibility of an implantable loop recorder  would also be considered, but we will try a noninvasive route first.  It  is possible that she is manifesting intermittent bradycardia or pauses  as a cause of her syncope, although this has not been documented as yet.  Otherwise, she is not reporting any exertional chest pain or progressive  dyspnea.   ALLERGIES:  1. CODEINE.  2. IODINE.  3. SEAFOOD.  4. EGGS.   PRESENT MEDICATIONS:  1. Paxil CR 12.5 mg p.o. daily.  2. Imdur 60 mg p.o. daily.  3. Simvastatin 40 mg p.o. q.h.s.  4. Synthroid 125 mcg p.o. daily.  5. Ticlid 250 mg p.o. b.i.d.  6. Travatan eye drops.  7. Multivitamin daily.  8. Tylenol p.r.n.   REVIEW OF SYSTEMS:  As described in the History of Present Illness.   EXAMINATION:  Blood pressure is 136/78, heart rate is 72, weight is 178  pounds.  The patient  is in no acute distress.  HEENT:  Conjunctiva, lids are normal.  Oropharynx clear.  NECK:  Supple.  No elevated jugular venous pressure, no loud bruits, no  thyromegaly.  LUNGS:  Clear without labored breathing.  CARDIAC EXAM:  A regular rate and rhythm.  A soft systolic murmur heard  at the base.  No S3 or pericardial rub.  ABDOMEN:  Soft, nontender.  EXTREMITIES:  Exhibit no marked pitting edema, distal pulses are 1+.  Kyphosis is noted.  Patient is alert and oriented x3.   IMPRESSION AND RECOMMENDATION:  1. Recurrent syncope/presyncope.  Etiology is potentially due to      bradycardia or pauses, although this has never been clearly      demonstrated.  I agree with holding beta-blocker therapy and would      like to place in a 30-day event recorder in the event that she has      any recurrent symptoms over the next few weeks.  If this is not      successful, we could ultimately consider an implantable loop,      particularly if  she does not have any recurrent events while      wearing the event recorder.  Otherwise, I will plan to see her back      over the next month.  It is reassuring to see that her ejection      fraction is normal and that she has no major valvular      abnormalities.  2. Further plans to follow.     Jonelle Sidle, MD  Electronically Signed    SGM/MedQ  DD: 08/10/2007  DT: 08/10/2007  Job #: [id number]   cc:   Leanne Chang, M.D.

## 2011-04-16 NOTE — Procedures (Signed)
EEG NUMBER:  [id number].   HISTORY:  This is an 77 year old with syncope who is having an EEG done  to evaluate for seizures.   PROCEDURE:  This is a portable EEG.   TECHNICAL DESCRIPTION:  Throughout this portable EEG there is a  posterior dominant rhythm of 8-9 Hz activity at 15-25 microvolts.  The  background activity is symmetric mostly comprised of lower alpha and  upper theta range activity at 10-30 microvolts.  Photic stimulation nor  hyperventilation were performed throughout this recording.  The patient  does become drowsy however, does not enter stage II sleep.  Throughout  this record there is no evidence of electrographic seizures or  interictal discharge activity.  EKG tracing shows a heart rate of 60-70  beats per minute.   IMPRESSION:  This portable EEG is within normal limits in the awake  state.      Bevelyn Buckles. Nash Shearer, M.D.  Electronically Signed     EAV:WUJW  D:  07/16/2007 17:43:30  T:  07/17/2007 12:47:32  Job #:  [id number]

## 2011-04-19 NOTE — Op Note (Signed)
Kathleen Mccarty, WISE NO.:  [id number]   MEDICAL RECORD NO.:  [id number]          PATIENT TYPE:  AMB   LOCATION:  SDS                          FACILITY:  [facility]   PHYSICIAN:  Herby Abraham., M.D.DATE OF BIRTH:  [birth year]   DATE OF PROCEDURE:  DATE OF DISCHARGE:  03/19/2007                               OPERATIVE REPORT   INDICATIONS AND JUSTIFICATIONS:  The patient is a 77 year old lady with  advanced open angle glaucoma right eye.  Despite medical and laser  therapy, her interocular pressure has been poorly controlled.  The  risks, benefits and alternative therapies were discussed with the  patient.  She decided to proceed with a Baerveldt glaucoma implant with  Tutoplast tissue graft right eye to lower the interocular pressure and  preserve residual vision.  Justification for outpatient surgery and  inpatient not required.  Justification for overnight stay not needed.   PREOPERATIVE DIAGNOSIS:  Uncontrolled open angle glaucoma right eye.   POSTOPERATIVE DIAGNOSIS:  Uncontrolled open angle glaucoma right eye.   PROCEDURE:  Baerveldt glaucoma implant and Tutoplast tissue graft right  eye.   SURGEON:  Melvenia Needles, M.D.   ASSISTANT:  Nurse.   ANESTHESIA:  Local with standby.   PROCEDURE:  The patient was brought to operating room 2 where she was  carefully positioned on the operating room table.  She received IV  sedation and Dr. Eulah Pont performed a right retrobulbar injection using  Marcaine 0.75% with Wydase added.  The skin about the right eye and  right side of the face was prepped and draped as a sterile field.  The  lid speculum was inserted.  The Baerveldt glaucoma implant was irrigated  and found to be freely open.  A 4-0 nylon suture was then passed  retrograde from the implant side down the tube and was then fixed in  position with two 7-0 Vicryl sutures tied tightly.  The tube was  retested and found to be occluded.  An incision was made  in the  superotemporal quadrant 6 mm posterior to the limbus between the  insertion of the superior rectus muscle and the lateral rectus muscle  tendon.  Prior to this a 6-0 silk suture had been passed intracorneally  in the superotemporal quadrant central to the limbus and then clamped to  the drapus retraction suture.  After opening the conjunctivae and  Tenon's capsule, dissection was carried out posteriorly.  Hemostasis was  obtained with bipolar cautery.  The superior rectus muscle was isolated  on the muscle hook and a second muscle hook was used to relieve any  fibrosis.  The lateral rectus muscle tension was then isolated down the  muscle hook and again, a second muscle was used to free any adhesions.  The Baerveldt glaucoma implant was then passed beneath the superior  rectus muscle which was again, isolated on two muscle hooks.  The nasal  wing of the implant was pushed further beneath the superior rectus  muscle and the temporal wing of the Baerveldt glaucoma implant was then  placed beneath the lateral rectus muscle tendon, isolated onto muscle  hooks.  The implant was then fixed to the sclera with two 8-0 nylon  sutures 12 mm posterior to the limbus.  The initial incision was then  dissected to the limbus.  Hemostasis was obtained with bipolar cautery.  Using a 22-gauge needle on the viscoelastic, a tunnel was created  beginning 4 mm posterior to the limbus and entering the anterior chamber  superiorly.  The Baerveldt glaucoma implant tube was trimmed and placed  through the tunnel which had been inflated with the viscoelastic.  The  tube was found to be slightly long and was removed and trimmed.  It was  then placed back into the tunnel.  It was well positioned to the  anterior chamber and an 8-0 nylon suture was used to fixate the tube to  the sclerae posterior to the tunnel without occluding the tube.  A  paracentesis tract was created at the 8 o'clock position at the  limbus  with a 15-degree super sharp blade just prior to entering the anterior  chamber with the 22-gauge needle.  Tutoplast was then placed in a double  layer over the tube and extending posteriorly to the implant.  This was  fixed to the sclerae with four 7-0 Vicryl sutures.  The conjunctival  Tenon's flap was then repaired with a running 7-0 Vicryl suture locked  on every other pass.  This was pulled up, tied and cut.  During the  final stages of the procedure, two drops of scopolamine and two drops of  Ocuflox were applied to the eye every 4 minutes for 5 applications.  At  the completion of the procedure, a balance salt solution was used to  irrigate the viscoelastic out of the anterior chamber through the  paracentesis tract.  The retraction suture was cut and removed.  The lid  speculum was removed.  The surgical drape was removed.  The eye was  dressed with additional drops of scopolamine and Ocuflox with a light  eye pad and a eye shield.  The patient was then transported to the  recovery area in good condition.  She is to be followed up in my office  on March 20, 2007 at 8:30 a.m.           ______________________________  Herby Abraham., M.D.     LC/MEDQ  D:  03/23/2007  T:  03/23/2007  Job:  [id number]

## 2011-04-19 NOTE — Op Note (Signed)
   NAME:  Kathleen Mccarty, Kathleen Mccarty                     ACCOUNT NO.:  [id number]   MEDICAL RECORD NO.:  [id number]                   PATIENT TYPE:  OIB   LOCATION:  2856                                 FACILITY:  [facility]   PHYSICIAN:  Armanda Magic, M.D.                  DATE OF BIRTH:  [birth year]   DATE OF PROCEDURE:  10/12/2003  DATE OF DISCHARGE:  10/12/2003                                 OPERATIVE REPORT   PROCEDURE PERFORMED:  Tilt table test.   INDICATIONS FOR PROCEDURE:  Syncope.   OPERATOR:  Armanda Magic, M.D.   HISTORY:  The patient is an 77 year old white female who recently had a  syncopal episode while in her kitchen.  She now presents for tilt table  test.   DESCRIPTION OF PROCEDURE:  The patient was brought to the cardiac  catheterization laboratory in a fasting nonsedated state.  Informed consent  was obtained.  The patient was connected to continuous heart rate and pulse  oximetry monitoring and intermittent blood pressure monitoring.  Baseline  blood pressure was measured in the supine position for five minutes.  These  blood pressures ranged from 167/87 to 177/89 with heart rate of 70s.  The  patient was then tilted upright to 70 degrees for a total of 45 minutes.  The lowest blood pressure ever achieved was 114/27mmHg with heart rates of  70s.  She did intermittently feel dizzy throughout the study but during the  times when she complained of dizziness, her blood pressure was good at  131/87 with pulses of 76.   IMPRESSION:  1. Syncope.  2. Negative tilt table test for syncope.  3. Hypertension with elevated blood pressure when she arrived.  She had not     taken her blood pressure medicines this morning.   PLAN:  Discharged to home.  Follow up with me in two weeks.                                               Armanda Magic, M.D.    TT/MEDQ  D:  10/12/2003  T:  10/13/2003  Job:  [id number]

## 2011-04-19 NOTE — Assessment & Plan Note (Signed)
[facility]                            CARDIOLOGY OFFICE NOTE   Kathleen Mccarty, Kathleen Mccarty                  MRN:          [id number]  DATE:09/17/2007                            DOB:          May 25, 1918    PRIMARY CARE PHYSICIAN:  Dr. Leanne Mccarty.   REASON FOR VISIT:  Followup atrial fibrillation and CardioNet monitor.   HISTORY OF PRESENT ILLNESS:  Kathleen Mccarty returns for a routine visit.  She has not had any episodes of syncope and did not have any while she  was wearing her CardioNet monitor.  She has experienced brief  palpitations described as a vibration in the chest.  These have  typically lasted just a few minutes.  We did document rapid atrial  fibrillation on her CardioNet monitor, although this was not associated  with any syncope and it is not entirely clear that this arrhythmia can  be blamed for her prior event.  Importantly she did not have any marked  bradycardia after we restarted her beta blocker and I have recommended  that she continue her Toprol XL for now given her rapid arrhythmias.  We  discussed Coumadin although she is concerned about starting this,  particularly as she has been on Ticlid longterm and has had a history of  falls.  I think this is not unreasonable.  Her rhythm is regular today  and otherwise she has been doing reasonably well.   ALLERGIES:  CODEINE, IODINE, SEAFOOD, EGGS.   PRESENT MEDICATIONS:  1. Paxil CR 12.5 mg p.o. daily.  2. Imdur 60 mg p.o. daily.  3. Simvastatin 40 mg p.o. daily.  4. Synthroid 125 mcg p.o. daily.  5. Ticlid 250 mg p.o. b.i.d.  6. Travatan eye drops.  7. Multivitamin daily.  8. Toprol XL 50 mg p.o. daily.   REVIEW OF SYSTEMS:  As described in the history of present illness.   EXAMINATION:  Blood pressure is 130/70, heart rate is 72, weight is 178  pounds.  NECK:  Supple, no elevated jugular venous pressure.  LUNGS:  Clear without labored breathing.  CARDIAC:  Reveals a  regular rate and rhythm, no loud murmur.  EXTREMITIES:  Exhibit no pitting edema.   IMPRESSION/RECOMMENDATION:  1. Paroxysmal atrial fibrillation with rapid ventricular response.      Will plan to continue beta blocker therapy.  It is not entirely      clear that this is at all related to her prior episodes of syncope.      She did not have any episodes while wearing her CardioNet monitor      and had no significant bradycardia since reinstitution of beta      blocker therapy.  We had discussed Coumadin although patient      prefers to hold off on this.  She will continue her Ticlid.  Will      plan to see her back over the next few months to review her      symptoms.  She will otherwise let us know if she has any      progressive problems in the interim.  2. Further plans  to follow.     Jonelle Sidle, MD  Electronically Signed    SGM/MedQ  DD: 09/17/2007  DT: 09/18/2007  Job #: [id number]   cc:   Kathleen Mccarty, M.D.

## 2011-04-19 NOTE — Discharge Summary (Signed)
NAME:  Kathleen Mccarty, Kathleen Mccarty                     ACCOUNT NO.:  [id number]   MEDICAL RECORD NO.:  [id number]                   PATIENT TYPE:  INP   LOCATION:  4729                                 FACILITY:  [facility]   PHYSICIAN:  Hollice Espy, M.D.            DATE OF BIRTH:  1918-04-07   DATE OF ADMISSION:  08/24/2003  DATE OF DISCHARGE:  08/24/2003                                 DISCHARGE SUMMARY   PRIMARY CARE DOCTOR:  Dr. Sharlet Salina.   DIAGNOSIS AT DISCHARGE:  Syncope, likely vasovagal.   SECONDARY DIAGNOSES:  1. Hypothyroidism.  2. Hyperlipidemia.  3. Hypertension.   ALLERGIES:  The patient is allergic to IODINE.   PROCEDURE:  She had a CT scan of the head without contrast which showed no  acute disease.   HISTORY OF PRESENT ILLNESS:  This is an 77 year old white female with past  medical history of hypertension, who presents after a syncopal episode.  The  patient previously had been well.  She states she had previous syncopal  episodes but the last one was over seven years ago.  She was doing well when  she was seated at her dinner table with her husband in the next room.  She  rose to stand and the next thing she remembers, she was on the ground.  Her  husband came in the room, time unknown that she was down, although it was no  more than 10 minutes, more likely just a few minutes.  He called out her  name and she woke up.  She had no confusion episodes, no postictal state.  She was not having any tremor when her husband walked in the room.  She did  not lose bowel or bladder function.  When the patient woke up, she told her  husband to call the paramedics who called her and brought her into the  emergency room.   The patient was admitted.  All of her vitals were checked.  H&H were stable  at 13.4 and 38.9.  CT of the head was normal.  EKG shows sinus rhythm with  first-degree A-V block but no acute ST or T wave elevations.  Initial set of  cardiac enzymes  were negative.  The patient was admitted, started on IV  fluids and brought in.  Her blood pressure on admission was 120/69.  Temperature was 97.6.   HOSPITAL COURSE:  In regards to her syncope, the patient remained relatively  stable.  Orthostatics were checked on the late afternoon of August 24, 2003.  Her blood pressure lying down was 145/81 with a pulse of 75; standing  up, she was 141/73 with a pulse of 70.  Three sets of cardiac enzymes were  checked, all of which were negative.  Her electrolytes remained stable.  She  had no electrolyte abnormality.  She had no drop in her H&H and her vitals  were unchanged.  She was also  put on telemetry monitor which showed  consistent with her first-degree A-V block, but no evidence of any new  changes.  She had an occasional PVC, but again, no runs of ventricular  tachycardia.  It was felt then that patient likely had a vasovagal episode  which was isolated.  It was felt that she was stable for medical discharge.  She was discharged on the evening of August 24, 2003.  The patient was  somewhat anxious about leaving and wanted to stay one more day to make sure  nothing would happen, however, given that we will be doing nothing for her  and we felt that she is safe to go home, she is to be discharged on  August 24, 2003.   CONDITION AT DISCHARGE:  Improved.   DISPOSITION:  She is discharged to home.  The rest of her medical issues  including her hypothyroidism and hypertension were stable.  The patient was  continued on all medications during her stay.   FOLLOWUP:  She is to follow up with her PCP in the next one to two weeks,  Dr. Crista Luria.                                                Hollice Espy, M.D.    SKK/MEDQ  D:  08/24/2003  T:  08/25/2003  Job:  [id number]   cc:   Fax #[phone] Menzer, Verta Ellen.D.

## 2011-04-19 NOTE — Assessment & Plan Note (Signed)
[facility]                            CARDIOLOGY OFFICE NOTE   Kathleen, Mccarty                  MRN:          [id number]  DATE:09/03/2007                            DOB:          02-04-1918    PRIMARY CARE PHYSICIAN:  Dr. Leanne Chang.   REASON FOR VISIT:  Followup CardioNet monitor.   HISTORY OF PRESENT ILLNESS:  I brought Kathleen Mccarty back to the office  somewhat early as her CardioNet monitor has detected episodes of rapid  paroxysmal atrial fibrillation with heart rates up into the 160s and  170s at times.  It is interesting to note that Kathleen Mccarty has been  completely asymptomatic with this.  Our main suspicion was that she  actually may have been having episodes of bradycardia or pauses leading  to her prior syncope although this has not been the case.  She has not  had a frank syncopal event.  She is also off of her Toprol XL which she  had been previously taking at 50 mg daily.  At the present time the  patient's CardioNet device has malfunctioned and she is getting a new  one through the mail over the next few days.  I have asked her to resume  her Toprol XL at prior dose once she gets her monitor so that we can  document her heart rate variability at that time and ensure that she  does not have any marked bradycardia on the beta blocker.  She is on  Ticlid at this time and I would fairly leery about placing her on  Coumadin, particularly with her episodes of syncope given an increased  risk of bleeding complications.  She was of the same mind.   ALLERGIES:  CODEINE, IODINE, SEAFOOD, EGGS.   PRESENT MEDICATIONS:  1. Paxil CR 12.5 mg p.o. daily.  2. Imdur 60 mg p.o. daily.  3. Simvastatin 40 mg p.o. nightly.  4. Synthroid 125 mcg p.o. daily.  5. Ticlid 250 mg p.o. b.i.d.  6. Travatan eye drops.  7. Multivitamin daily.  8. Tylenol Extra Strength p.r.n.   REVIEW OF SYSTEMS:  As described in the history of present  illness.   EXAMINATION:  Blood pressure is 198/90 today, heart rate is 72 and  regular, weight is 179 pounds.  Patient is in no acute distress.  NECK:  Reveals no elevated jugular venous pressure or loud bruits.  LUNGS:  Clear without labored breathing.  CARDIAC:  Reveals a regular rate and rhythm, soft basal systolic murmur  is noted, no S3 gallop.  EXTREMITIES:  Exhibit no significant pitting edema.   IMPRESSION AND RECOMMENDATION:  1. Paroxysmal atrial fibrillation with rapid ventricular response,      newly diagnosed.  It is not entirely clear that this is symptom      provoking in speaking with the patient.  She has had no syncope and      no sense of palpitations, chest pain or marked dyspnea.  I have      asked her to resume her Toprol XL at 50 mg daily once she receives  her CardioNet device and then we can document her heart rate      variability on beta blocker therapy.  I am leery about initiating      any Coumadin in her case given her history of syncope and      propensity to falls with bleeding complications.  We could always      review this later.  I will have her come back to the office over      the next few weeks and review her progress.  It is still not      entirely clear regarding the etiology of her prior episodes of      syncope.  2. Further plans to follow.     Jonelle Sidle, MD  Electronically Signed    SGM/MedQ  DD: 09/03/2007  DT: 09/04/2007  Job #: [id number]   cc:   Leanne Chang, M.D.

## 2011-09-13 LAB — DIFFERENTIAL
Basophils Absolute: 0
Basophils Relative: 1
Monocytes Absolute: 0.4
Neutro Abs: 3.5
Neutrophils Relative %: 71

## 2011-09-13 LAB — COMPREHENSIVE METABOLIC PANEL
Alkaline Phosphatase: 70
BUN: 13
Chloride: 107
GFR calc non Af Amer: >60
Glucose, Bld: 164 — ABNORMAL HIGH
Potassium: 4
Total Bilirubin: 0.7

## 2011-09-13 LAB — CBC
HCT: 37.9
Hemoglobin: 12.9
MCV: 91.3
WBC: 4.9

## 2011-09-13 LAB — POCT CARDIAC MARKERS
Myoglobin, poc: 110
Myoglobin, poc: 112
Operator id: 1627

## 2011-09-13 LAB — HEMOGLOBIN A1C: Hgb A1c MFr Bld: 6

## 2011-09-13 LAB — CK TOTAL AND CKMB (NOT AT ARMC)
Relative Index: INVALID
Total CK: 30

## 2011-09-13 LAB — TSH: TSH: 1.123

## 2011-09-13 LAB — CARDIAC PANEL(CRET KIN+CKTOT+MB+TROPI): CK, MB: 1.3

## 2011-09-13 LAB — D-DIMER, QUANTITATIVE: D-Dimer, Quant: 0.25

## 2011-09-15 ENCOUNTER — Emergency Department (HOSPITAL_COMMUNITY): Payer: MEDICARE

## 2011-09-15 ENCOUNTER — Emergency Department (HOSPITAL_COMMUNITY)
Admission: EM | Admit: 2011-09-15 | Discharge: 2011-09-15 | Disposition: A | Discharge status: Other Acute Inpatient Hospital | Payer: MEDICARE | Attending: Emergency Medicine | Admitting: Emergency Medicine

## 2011-09-15 DIAGNOSIS — E039 Hypothyroidism, unspecified: Secondary | ICD-10-CM | POA: Insufficient documentation

## 2011-09-15 DIAGNOSIS — I4891 Unspecified atrial fibrillation: Secondary | ICD-10-CM | POA: Insufficient documentation

## 2011-09-15 DIAGNOSIS — E785 Hyperlipidemia, unspecified: Secondary | ICD-10-CM | POA: Insufficient documentation

## 2011-09-15 DIAGNOSIS — R079 Chest pain, unspecified: Secondary | ICD-10-CM | POA: Insufficient documentation

## 2011-09-15 DIAGNOSIS — R0602 Shortness of breath: Secondary | ICD-10-CM | POA: Insufficient documentation

## 2011-09-15 DIAGNOSIS — E860 Dehydration: Secondary | ICD-10-CM | POA: Insufficient documentation

## 2011-09-15 DIAGNOSIS — I10 Essential (primary) hypertension: Secondary | ICD-10-CM | POA: Insufficient documentation

## 2011-09-15 DIAGNOSIS — R42 Dizziness and giddiness: Secondary | ICD-10-CM | POA: Insufficient documentation

## 2011-09-15 LAB — CBC
HCT: 41.5 % (ref 36.0–46.0)
Hemoglobin: 14.2 g/dL (ref 12.0–15.0)
MCH: 30.8 pg (ref 26.0–34.0)
MCHC: 34.2 g/dL (ref 30.0–36.0)
MCV: 90 fL (ref 78.0–100.0)
Platelets: 176 10*3/uL (ref 150–400)
RBC: 4.61 MIL/uL (ref 3.87–5.11)
RDW: 12.9 % (ref 11.5–15.5)
WBC: 7.6 10*3/uL (ref 4.0–10.5)

## 2011-09-15 LAB — URINALYSIS, ROUTINE W REFLEX MICROSCOPIC
Bilirubin Urine: NEGATIVE
Glucose, UA: NEGATIVE mg/dL
Hgb urine dipstick: NEGATIVE
Ketones, ur: NEGATIVE mg/dL
Nitrite: NEGATIVE
Protein, ur: NEGATIVE mg/dL
Specific Gravity, Urine: 1.009 (ref 1.005–1.030)
Urobilinogen, UA: 0.2 mg/dL (ref 0.0–1.0)
pH: 6.5 (ref 5.0–8.0)

## 2011-09-15 LAB — PRO B NATRIURETIC PEPTIDE: Pro B Natriuretic peptide (BNP): 2305 pg/mL — ABNORMAL HIGH (ref 0–450)

## 2011-09-15 LAB — BASIC METABOLIC PANEL WITH GFR
BUN: 14 mg/dL (ref 6–23)
CO2: 26 meq/L (ref 19–32)
Calcium: 9.4 mg/dL (ref 8.4–10.5)
Creatinine, Ser: 0.55 mg/dL (ref 0.50–1.10)
Glucose, Bld: 124 mg/dL — ABNORMAL HIGH (ref 70–99)

## 2011-09-15 LAB — BASIC METABOLIC PANEL
Chloride: 105 mEq/L (ref 96–112)
GFR calc Af Amer: >90 mL/min (ref >90)
GFR calc non Af Amer: 80 mL/min — ABNORMAL LOW (ref >90)
Potassium: 4.1 mEq/L (ref 3.5–5.1)
Sodium: 141 mEq/L (ref 135–145)

## 2011-09-15 LAB — DIFFERENTIAL
Basophils Absolute: 0.1 10*3/uL (ref 0.0–0.1)
Basophils Relative: 1 % (ref 0–1)
Eosinophils Absolute: 0.2 K/uL (ref 0.0–0.7)
Eosinophils Relative: 2 % (ref 0–5)
Lymphocytes Relative: 21 % (ref 12–46)
Lymphs Abs: 1.6 K/uL (ref 0.7–4.0)
Monocytes Absolute: 0.6 K/uL (ref 0.1–1.0)
Monocytes Relative: 8 % (ref 3–12)
Neutro Abs: 5.2 10*3/uL (ref 1.7–7.7)
Neutrophils Relative %: 68 % (ref 43–77)

## 2011-09-15 LAB — PROTIME-INR
INR: 2.24 — ABNORMAL HIGH (ref 0.00–1.49)
Prothrombin Time: 25.2 seconds — ABNORMAL HIGH (ref 11.6–15.2)

## 2011-09-15 LAB — URINE MICROSCOPIC-ADD ON

## 2011-09-15 LAB — POCT I-STAT TROPONIN I

## 2011-09-16 LAB — TROPONIN I: Troponin I: 0.02

## 2011-09-16 LAB — URINALYSIS, ROUTINE W REFLEX MICROSCOPIC
Bilirubin Urine: NEGATIVE
Glucose, UA: NEGATIVE
Nitrite: NEGATIVE
Nitrite: NEGATIVE
Specific Gravity, Urine: 1.012
Specific Gravity, Urine: 1.023
Urobilinogen, UA: 0.2
pH: 5.5

## 2011-09-16 LAB — DIFFERENTIAL
Basophils Absolute: 0
Basophils Absolute: 0
Basophils Relative: 0
Eosinophils Absolute: 0
Eosinophils Relative: 0
Eosinophils Relative: 0
Lymphocytes Relative: 11 — ABNORMAL LOW
Lymphs Abs: 0.6 — ABNORMAL LOW
Monocytes Absolute: 0.4
Neutro Abs: 2.8
Neutro Abs: 3.9

## 2011-09-16 LAB — CBC
HCT: 38.2
HCT: 39.3
Hemoglobin: 13.3
MCHC: 34.8
MCHC: 34.9
MCV: 89.6
Platelets: 144 — ABNORMAL LOW
RBC: 4.39
RDW: 12.7
WBC: 4.9

## 2011-09-16 LAB — URINE CULTURE
Colony Count: NO GROWTH
Culture: NO GROWTH

## 2011-09-16 LAB — LIPID PANEL
LDL Cholesterol: 87
Total CHOL/HDL Ratio: 3.3
VLDL: 22

## 2011-09-16 LAB — PROTIME-INR
INR: 1.1
Prothrombin Time: 14

## 2011-09-16 LAB — POCT CARDIAC MARKERS: Myoglobin, poc: 114

## 2011-09-16 LAB — COMPREHENSIVE METABOLIC PANEL
AST: 27
BUN: 11
CO2: 25
Calcium: 8.8
Chloride: 107
Creatinine, Ser: 0.78
GFR calc Af Amer: >60
GFR calc non Af Amer: >60
Total Bilirubin: 0.7

## 2011-09-16 LAB — CK TOTAL AND CKMB (NOT AT ARMC)
CK, MB: 1.1
Relative Index: INVALID
Relative Index: INVALID
Total CK: 30
Total CK: 37

## 2011-09-16 LAB — URINE MICROSCOPIC-ADD ON

## 2011-11-09 ENCOUNTER — Encounter: Payer: Self-pay | Admitting: Family Medicine

## 2011-11-09 ENCOUNTER — Other Ambulatory Visit: Payer: Self-pay

## 2011-11-09 ENCOUNTER — Emergency Department (HOSPITAL_COMMUNITY): Payer: MEDICARE

## 2011-11-09 ENCOUNTER — Inpatient Hospital Stay (HOSPITAL_COMMUNITY)
Admission: EM | Admit: 2011-11-09 | Discharge: 2011-11-12 | DRG: 293 | Disposition: A | Discharge status: Home Health | Payer: MEDICARE | Source: Ambulatory Visit | Attending: Internal Medicine | Admitting: Internal Medicine

## 2011-11-09 DIAGNOSIS — R0989 Other specified symptoms and signs involving the circulatory and respiratory systems: Secondary | ICD-10-CM | POA: Diagnosis present

## 2011-11-09 DIAGNOSIS — I5033 Acute on chronic diastolic (congestive) heart failure: Principal | ICD-10-CM | POA: Diagnosis present

## 2011-11-09 DIAGNOSIS — J309 Allergic rhinitis, unspecified: Secondary | ICD-10-CM

## 2011-11-09 DIAGNOSIS — E785 Hyperlipidemia, unspecified: Secondary | ICD-10-CM

## 2011-11-09 DIAGNOSIS — R55 Syncope and collapse: Secondary | ICD-10-CM

## 2011-11-09 DIAGNOSIS — R404 Transient alteration of awareness: Secondary | ICD-10-CM | POA: Diagnosis not present

## 2011-11-09 DIAGNOSIS — I447 Left bundle-branch block, unspecified: Secondary | ICD-10-CM | POA: Diagnosis present

## 2011-11-09 DIAGNOSIS — R079 Chest pain, unspecified: Secondary | ICD-10-CM | POA: Diagnosis present

## 2011-11-09 DIAGNOSIS — E119 Type 2 diabetes mellitus without complications: Secondary | ICD-10-CM | POA: Diagnosis present

## 2011-11-09 DIAGNOSIS — I2510 Atherosclerotic heart disease of native coronary artery without angina pectoris: Secondary | ICD-10-CM | POA: Diagnosis present

## 2011-11-09 DIAGNOSIS — Z7901 Long term (current) use of anticoagulants: Secondary | ICD-10-CM

## 2011-11-09 DIAGNOSIS — I10 Essential (primary) hypertension: Secondary | ICD-10-CM | POA: Diagnosis present

## 2011-11-09 DIAGNOSIS — R0609 Other forms of dyspnea: Secondary | ICD-10-CM | POA: Diagnosis present

## 2011-11-09 DIAGNOSIS — T381X5A Adverse effect of thyroid hormones and substitutes, initial encounter: Secondary | ICD-10-CM | POA: Diagnosis present

## 2011-11-09 DIAGNOSIS — M129 Arthropathy, unspecified: Secondary | ICD-10-CM | POA: Diagnosis present

## 2011-11-09 DIAGNOSIS — E039 Hypothyroidism, unspecified: Secondary | ICD-10-CM | POA: Diagnosis present

## 2011-11-09 DIAGNOSIS — M79609 Pain in unspecified limb: Secondary | ICD-10-CM

## 2011-11-09 DIAGNOSIS — I509 Heart failure, unspecified: Secondary | ICD-10-CM | POA: Diagnosis present

## 2011-11-09 DIAGNOSIS — E0580 Other thyrotoxicosis without thyrotoxic crisis or storm: Secondary | ICD-10-CM | POA: Diagnosis present

## 2011-11-09 DIAGNOSIS — J810 Acute pulmonary edema: Secondary | ICD-10-CM | POA: Diagnosis present

## 2011-11-09 DIAGNOSIS — I4891 Unspecified atrial fibrillation: Secondary | ICD-10-CM | POA: Diagnosis present

## 2011-11-09 HISTORY — DX: Cardiac arrhythmia, unspecified: I49.9

## 2011-11-09 HISTORY — DX: Acquired absence of other part of head and neck: Z90.09

## 2011-11-09 HISTORY — DX: Hyperlipidemia, unspecified: E78.5

## 2011-11-09 HISTORY — DX: Malignant (primary) neoplasm, unspecified: C80.1

## 2011-11-09 HISTORY — DX: Unspecified osteoarthritis, unspecified site: M19.90

## 2011-11-09 HISTORY — DX: Essential (primary) hypertension: I10

## 2011-11-09 HISTORY — DX: Postprocedural hypothyroidism: E89.0

## 2011-11-09 HISTORY — DX: Unspecified atrial fibrillation: I48.91

## 2011-11-09 LAB — MAGNESIUM: Magnesium: 1.9 mg/dL (ref 1.5–2.5)

## 2011-11-09 LAB — CARDIAC PANEL(CRET KIN+CKTOT+MB+TROPI)
CK, MB: 3.2 ng/mL (ref 0.3–4.0)
CK, MB: 2.9 ng/mL (ref 0.3–4.0)
Relative Index: INVALID (ref 0.0–2.5)
Total CK: 43 U/L (ref 7–177)
Troponin I: <0.3 ng/mL (ref <0.30)

## 2011-11-09 LAB — BLOOD GAS, ARTERIAL
Acid-Base Excess: 1.2 mmol/L (ref 0.0–2.0)
Bicarbonate: 24.7 mEq/L — ABNORMAL HIGH (ref 20.0–24.0)
O2 Saturation: 98.3 %
Patient temperature: 98.6
TCO2: 25.8 mmol/L (ref 0–100)

## 2011-11-09 LAB — BASIC METABOLIC PANEL
GFR calc Af Amer: >90 mL/min (ref >90)
GFR calc non Af Amer: 80 mL/min — ABNORMAL LOW (ref >90)
Potassium: 3.7 mEq/L (ref 3.5–5.1)
Sodium: 140 mEq/L (ref 135–145)

## 2011-11-09 LAB — CBC
MCHC: 34.3 g/dL (ref 30.0–36.0)
Platelets: 186 10*3/uL (ref 150–400)
RDW: 13.1 % (ref 11.5–15.5)

## 2011-11-09 LAB — URINALYSIS, ROUTINE W REFLEX MICROSCOPIC
Bilirubin Urine: NEGATIVE
Hgb urine dipstick: NEGATIVE
Nitrite: NEGATIVE
Specific Gravity, Urine: 1.009 (ref 1.005–1.030)
pH: 5 (ref 5.0–8.0)

## 2011-11-09 LAB — URINE MICROSCOPIC-ADD ON

## 2011-11-09 MED ORDER — DEXTROSE 5 % IV SOLN
5.0000 mg/h | Freq: Once | INTRAVENOUS | Status: AC
  Administered 2011-11-09: 5 mg/h via INTRAVENOUS
  Filled 2011-11-09: qty 100

## 2011-11-09 MED ORDER — DILTIAZEM HCL ER 60 MG PO CP12: 120.0000 mg | ORAL_CAPSULE | Freq: Two times a day (BID) | ORAL | Status: DC

## 2011-11-09 MED ORDER — PAROXETINE HCL ER 12.5 MG PO TB24
12.5000 mg | ORAL_TABLET | Freq: Every day | ORAL | Status: DC
  Administered 2011-11-09 – 2011-11-12 (×4): 12.5 mg via ORAL
  Filled 2011-11-09 (×4): qty 1

## 2011-11-09 MED ORDER — ONDANSETRON HCL 4 MG/2ML IJ SOLN: 4.0000 mg | Freq: Four times a day (QID) | INTRAMUSCULAR | Status: DC | PRN

## 2011-11-09 MED ORDER — DORZOLAMIDE HCL 2 % OP SOLN
1.0000 [drp] | Freq: Two times a day (BID) | OPHTHALMIC | Status: DC
  Administered 2011-11-09 – 2011-11-12 (×6): 1 [drp] via OPHTHALMIC
  Filled 2011-11-09: qty 10

## 2011-11-09 MED ORDER — LEVALBUTEROL HCL 0.63 MG/3ML IN NEBU
0.6300 mg | INHALATION_SOLUTION | Freq: Four times a day (QID) | RESPIRATORY_TRACT | Status: DC | PRN
  Filled 2011-11-09: qty 3

## 2011-11-09 MED ORDER — ISOSORBIDE MONONITRATE ER 60 MG PO TB24
60.0000 mg | ORAL_TABLET | Freq: Once | ORAL | Status: AC
  Administered 2011-11-09: 60 mg via ORAL
  Filled 2011-11-09: qty 1

## 2011-11-09 MED ORDER — TRAVOPROST (BAK FREE) 0.004 % OP SOLN
1.0000 [drp] | Freq: Every day | OPHTHALMIC | Status: DC
  Administered 2011-11-09 – 2011-11-11 (×2): 1 [drp] via OPHTHALMIC
  Filled 2011-11-09: qty 2.5

## 2011-11-09 MED ORDER — METOPROLOL SUCCINATE ER 50 MG PO TB24: 50.0000 mg | ORAL_TABLET | Freq: Every day | ORAL | Status: DC

## 2011-11-09 MED ORDER — ACETAMINOPHEN 325 MG PO TABS
650.0000 mg | ORAL_TABLET | Freq: Four times a day (QID) | ORAL | Status: DC | PRN
  Administered 2011-11-09 – 2011-11-10 (×2): 650 mg via ORAL
  Filled 2011-11-09 (×2): qty 2

## 2011-11-09 MED ORDER — WARFARIN SODIUM 4 MG PO TABS
4.5000 mg | ORAL_TABLET | Freq: Once | ORAL | Status: AC
  Administered 2011-11-10: 4.5 mg via ORAL
  Filled 2011-11-09: qty 1

## 2011-11-09 MED ORDER — SIMVASTATIN 40 MG PO TABS
40.0000 mg | ORAL_TABLET | Freq: Every day | ORAL | Status: DC
  Administered 2011-11-09 – 2011-11-11 (×3): 40 mg via ORAL
  Filled 2011-11-09 (×4): qty 1

## 2011-11-09 MED ORDER — SODIUM CHLORIDE 0.9 % IV SOLN: 250.0000 mL | INTRAVENOUS | Status: DC | PRN

## 2011-11-09 MED ORDER — ISOSORBIDE MONONITRATE ER 60 MG PO TB24: 60.0000 mg | ORAL_TABLET | Freq: Every day | ORAL | Status: DC

## 2011-11-09 MED ORDER — METOPROLOL TARTRATE 25 MG PO TABS
ORAL_TABLET | ORAL | Status: AC
  Filled 2011-11-09: qty 1

## 2011-11-09 MED ORDER — WARFARIN SODIUM 2.5 MG PO TABS: 2.5000 mg | ORAL_TABLET | ORAL | Status: DC

## 2011-11-09 MED ORDER — ASPIRIN 81 MG PO CHEW
324.0000 mg | CHEWABLE_TABLET | Freq: Once | ORAL | Status: DC
  Filled 2011-11-09: qty 4

## 2011-11-09 MED ORDER — LEVOTHYROXINE SODIUM 125 MCG PO TABS
125.0000 ug | ORAL_TABLET | Freq: Every day | ORAL | Status: DC
  Administered 2011-11-09 – 2011-11-10 (×2): 125 ug via ORAL
  Filled 2011-11-09 (×2): qty 1

## 2011-11-09 MED ORDER — PANTOPRAZOLE SODIUM 40 MG PO TBEC
40.0000 mg | DELAYED_RELEASE_TABLET | Freq: Every day | ORAL | Status: DC
  Administered 2011-11-10 – 2011-11-12 (×3): 40 mg via ORAL
  Filled 2011-11-09 (×2): qty 1

## 2011-11-09 MED ORDER — SODIUM CHLORIDE 0.9 % IJ SOLN
3.0000 mL | Freq: Two times a day (BID) | INTRAMUSCULAR | Status: DC
  Administered 2011-11-09: 3 mL via INTRAVENOUS
  Administered 2011-11-10: 10 mL via INTRAVENOUS
  Administered 2011-11-10 – 2011-11-12 (×4): 3 mL via INTRAVENOUS

## 2011-11-09 MED ORDER — TIMOLOL MALEATE 0.5 % OP SOLN
1.0000 [drp] | Freq: Two times a day (BID) | OPHTHALMIC | Status: DC
  Administered 2011-11-09 – 2011-11-12 (×5): 1 [drp] via OPHTHALMIC
  Filled 2011-11-09: qty 5

## 2011-11-09 MED ORDER — WARFARIN SODIUM 4 MG PO TABS: 4.5000 mg | ORAL_TABLET | ORAL | Status: DC

## 2011-11-09 MED ORDER — POTASSIUM CHLORIDE 20 MEQ/15ML (10%) PO LIQD
ORAL | Status: AC
  Filled 2011-11-09: qty 30

## 2011-11-09 MED ORDER — ALUM & MAG HYDROXIDE-SIMETH 200-200-20 MG/5ML PO SUSP: 30.0000 mL | Freq: Four times a day (QID) | ORAL | Status: DC | PRN

## 2011-11-09 MED ORDER — DORZOLAMIDE HCL-TIMOLOL MAL 2-0.5 % OP SOLN: 1.0000 [drp] | Freq: Two times a day (BID) | OPHTHALMIC | Status: DC

## 2011-11-09 MED ORDER — ZOLPIDEM TARTRATE 5 MG PO TABS: 5.0000 mg | ORAL_TABLET | Freq: Every evening | ORAL | Status: DC | PRN

## 2011-11-09 MED ORDER — DORZOLAMIDE HCL-TIMOLOL MAL 2-0.5 % OP SOLN
1.0000 [drp] | Freq: Two times a day (BID) | OPHTHALMIC | Status: DC
  Filled 2011-11-09 (×2): qty 10

## 2011-11-09 MED ORDER — DILTIAZEM HCL ER COATED BEADS 240 MG PO CP24
240.0000 mg | ORAL_CAPSULE | Freq: Every day | ORAL | Status: DC
  Administered 2011-11-10: 240 mg via ORAL
  Filled 2011-11-09 (×2): qty 1

## 2011-11-09 MED ORDER — METOPROLOL SUCCINATE ER 50 MG PO TB24
50.0000 mg | ORAL_TABLET | Freq: Once | ORAL | Status: AC
  Administered 2011-11-09: 50 mg via ORAL
  Filled 2011-11-09 (×2): qty 1

## 2011-11-09 MED ORDER — SODIUM CHLORIDE 0.9 % IJ SOLN: 3.0000 mL | INTRAMUSCULAR | Status: DC | PRN

## 2011-11-09 MED ORDER — ONDANSETRON HCL 4 MG PO TABS: 4.0000 mg | ORAL_TABLET | Freq: Four times a day (QID) | ORAL | Status: DC | PRN

## 2011-11-09 MED ORDER — DILTIAZEM HCL ER 60 MG PO CP12
120.0000 mg | ORAL_CAPSULE | Freq: Two times a day (BID) | ORAL | Status: DC
  Filled 2011-11-09: qty 2

## 2011-11-09 MED ORDER — FUROSEMIDE 10 MG/ML IJ SOLN
40.0000 mg | Freq: Every day | INTRAMUSCULAR | Status: AC
  Administered 2011-11-09 – 2011-11-10 (×2): 40 mg via INTRAVENOUS
  Filled 2011-11-09 (×2): qty 4

## 2011-11-09 MED ORDER — POTASSIUM CHLORIDE 20 MEQ/15ML (10%) PO LIQD
40.0000 meq | Freq: Every day | ORAL | Status: DC
  Administered 2011-11-09: 40 meq via ORAL

## 2011-11-09 MED ORDER — ACETAMINOPHEN 650 MG RE SUPP: 650.0000 mg | Freq: Four times a day (QID) | RECTAL | Status: DC | PRN

## 2011-11-09 MED ORDER — DILTIAZEM HCL ER COATED BEADS 120 MG PO CP24
120.0000 mg | ORAL_CAPSULE | Freq: Once | ORAL | Status: AC
  Administered 2011-11-09: 120 mg via ORAL
  Filled 2011-11-09: qty 1

## 2011-11-09 NOTE — ED Notes (Signed)
Patient denies pain and is resting comfortably. Denies SOB. Remains on cardizem gtt at 5mg /hr. A fib on monitor. HR 110-120/min. Resp e/u, shallow with RR 28/min. POX>96% 3L [place]

## 2011-11-09 NOTE — ED Notes (Signed)
[id number] ready

## 2011-11-09 NOTE — Progress Notes (Signed)
ANTICOAGULATION CONSULT NOTE - Initial Consult  Pharmacy Consult for coumadin Indication: atrial fibrillation  Allergies  Allergen Reactions  . Amoxicillin     Hard on stomach  . Codeine     Unsure of reaction, may have been rash or may have been confusion  . Eggs Or Egg-Derived Products   . Iodine   . Shellfish Allergy   . Adhesive (Tape) Rash    Sensitive to bandages    Patient Measurements:   Adjusted Body Weight:   Vital Signs: Temp: 98 F (36.7 C) (12/08 0839) Temp src: Axillary (12/08 0839) BP: 121/70 mmHg (12/08 1716) Pulse Rate: 102  (12/08 1716)  Labs:  Basename 11/09/11 0900  HGB 14.0  HCT 40.8  PLT 186  APTT --  LABPROT 24.6*  INR 2.18*  HEPARINUNFRC --  CREATININE 0.54  CKTOTAL 43  CKMB 3.2  TROPONINI <0.30   CrCl is unknown because there is no height on file for the current visit.  Medical History: History reviewed. No pertinent past medical history.  Medications:  Prescriptions prior to admission  Medication Sig Dispense Refill  . acetaminophen (TYLENOL) 500 MG tablet Take 1,000 mg by mouth 2 (two) times daily.        . dorzolamide-timolol (COSOPT) 22.3-6.8 MG/ML ophthalmic solution Place 1 drop into both eyes 2 (two) times daily.        . isosorbide mononitrate (IMDUR) 60 MG 24 hr tablet Take 60 mg by mouth daily.        Marland Kitchen levothyroxine (SYNTHROID, LEVOTHROID) 125 MCG tablet Take 125 mcg by mouth daily.        . metoprolol (TOPROL-XL) 50 MG 24 hr tablet Take 50 mg by mouth daily.        . Multiple Vitamins-Minerals (ICAPS AREDS FORMULA PO) Take 2 tablets by mouth 2 (two) times daily.        Marland Kitchen PARoxetine (PAXIL-CR) 12.5 MG 24 hr tablet Take 12.5 mg by mouth daily.        . simvastatin (ZOCOR) 40 MG tablet Take 40 mg by mouth at bedtime.        . Travoprost, BAK Free, (TRAVATAN) 0.004 % SOLN ophthalmic solution Place 1 drop into the left eye at bedtime.        Marland Kitchen warfarin (COUMADIN) 2.5 MG tablet Take 2.5 mg by mouth 4 (four) times a week.  Sunday, Monday, Wednesday, Friday       . warfarin (COUMADIN) 3 MG tablet Take 4.5 mg by mouth every Tuesday, Thursday, and Saturday at 6 PM.         Scheduled:    . aspirin  324 mg Oral Once  . diltiazem  120 mg Oral Once  . diltiazem  120 mg Oral Q12H  . diltiazem (CARDIZEM) infusion  5 mg/hr Intravenous Once  . dorzolamide-timolol  1 drop Both Eyes BID  . furosemide  40 mg Intravenous Daily  . isosorbide mononitrate  60 mg Oral Once  . levothyroxine  125 mcg Oral Daily  . metoprolol succinate  50 mg Oral Once  . pantoprazole  40 mg Oral Q1200  . PARoxetine  12.5 mg Oral Daily  . potassium chloride  40 mEq Oral Daily  . potassium chloride      . simvastatin  40 mg Oral QHS  . sodium chloride  3 mL Intravenous Q12H  . Travoprost (BAK Free)  1 drop Left Eye QHS  . warfarin  2.5 mg Oral 4 times weekly  . warfarin  4.5 mg  Oral Q T,Th,Sat-1800  . DISCONTD: isosorbide mononitrate  60 mg Oral Daily  . DISCONTD: metoprolol  50 mg Oral Daily   Infusions:    Assessment: 77 yo who is on chronic coumadin for afib. She was admitted today for CHF likely due to recurrent afib. Her INR is therapeutic today. We'll manage her coumadin while she is here.   Goal of Therapy:  INR 2-3   Plan:  1. Coumadin 4.5mg  x1 2. Daily PT/INR   Ulyses [place]ward [place] 11/09/2011,6:10 PM

## 2011-11-09 NOTE — ED Provider Notes (Signed)
History     CSN: [id number] Arrival date & time: 11/09/2011  8:38 AM   First MD Initiated Contact with Patient 11/09/11 [phone]      Chief Complaint  Patient presents with  . Shortness of Breath    (Consider location/radiation/quality/duration/timing/severity/associated sxs/prior treatment) HPI 77 year old female has had dyspnea and mild chest pressure since about 4 AM. Dyspnea is worse when she lays flat and worse when she exerts herself. Dyspnea has been severe. She has not treated herself with anything. EMS gave her Lasix intravenously. She does have a history of atrial fibrillation which is intermittent. She was not aware of a rapid heartbeat today. No past medical history on file.  No past surgical history on file.  No family history on file.  History  Substance Use Topics  . Smoking status: Not on file  . Smokeless tobacco: Not on file  . Alcohol Use: Not on file    OB History    Grav Para Term Preterm Abortions TAB SAB Ect Mult Living                  Review of Systems  Allergies  Amoxicillin; Codeine; Eggs or egg-derived products; Iodine; Shellfish allergy; and Adhesive  Home Medications   Current Outpatient Rx  Name Route Sig Dispense Refill  . ACETAMINOPHEN 500 MG PO TABS Oral Take 1,000 mg by mouth 2 (two) times daily.      . DORZOLAMIDE HCL-TIMOLOL MAL 22.3-6.8 MG/ML OP SOLN Both Eyes Place 1 drop into both eyes 2 (two) times daily.      . ISOSORBIDE MONONITRATE ER 60 MG PO TB24 Oral Take 60 mg by mouth daily.      Marland Kitchen LEVOTHYROXINE SODIUM 125 MCG PO TABS Oral Take 125 mcg by mouth daily.      Marland Kitchen METOPROLOL SUCCINATE ER 50 MG PO TB24 Oral Take 50 mg by mouth daily.      . ICAPS AREDS FORMULA PO Oral Take 2 tablets by mouth 2 (two) times daily.      Marland Kitchen PAROXETINE HCL 12.5 MG PO TB24 Oral Take 12.5 mg by mouth daily.      Marland Kitchen SIMVASTATIN 40 MG PO TABS Oral Take 40 mg by mouth at bedtime.      . TRAVOPROST (BAK FREE) 0.004 % OP SOLN Left Eye Place 1 drop into the  left eye at bedtime.      . WARFARIN SODIUM 2.5 MG PO TABS Oral Take 2.5 mg by mouth 4 (four) times a week. Sunday, Monday, Wednesday, Friday     . WARFARIN SODIUM 3 MG PO TABS Oral Take 4.5 mg by mouth every Tuesday, Thursday, and Saturday at 6 PM.        BP 108/68  Pulse 152  Temp(Src) 98 F (36.7 C) (Axillary)  Resp 32  SpO2 100%  Physical Exam 77 year old female who appears moderately dyspneic. Vital signs are significant for tachypnea with a respiratory rate of 32 and tachycardia with heart rate of 152. Monitor shows atrial fibrillation with rapid ventricular response. Head is normocephalic and atraumatic. PERRLA, EOMI. Oropharynx is clear. Neck is supple without adenopathy. JVD is present. Back is nontender there is no CVA tenderness. Lungs have diffuse rales. Heart is tachycardic and irregular without murmur. Abdomen is soft, flat, nontender without masses or hepatosplenomegaly. Extremities have 1+ edema. Skin is warm and moist without rash. Neurologic: Mental status is normal, cranial nerves are intact, there are no motor or sensory deficits. Psychiatric: No abnormalities of mood or affect.  ED Course  Procedures (including critical care time)  Labs Reviewed  BASIC METABOLIC PANEL - Abnormal; Notable for the following:    Glucose, Bld 209 (*)    GFR calc non Af Amer 80 (*)    All other components within normal limits  PROTIME-INR - Abnormal; Notable for the following:    Prothrombin Time 24.6 (*)    INR 2.18 (*)    All other components within normal limits  URINALYSIS, ROUTINE W REFLEX MICROSCOPIC - Abnormal; Notable for the following:    Leukocytes, UA SMALL (*)    All other components within normal limits  CBC  URINE MICROSCOPIC-ADD ON  PRO B NATRIURETIC PEPTIDE  CARDIAC PANEL(CRET KIN+CKTOT+MB+TROPI)  URINE CULTURE   Chest Portable 1 View  11/09/2011  *RADIOLOGY REPORT*  Clinical Data: Short of breath.  Chest pain.  Atrial fibrillation. Mitral valve prolapse.  PORTABLE  CHEST - 1 VIEW  Comparison: 09/15/2011.  Findings: Cardiomegaly is present.  Event recorder is present in the left chest.  Aortic arch atherosclerosis.  Pulmonary vascular congestion and interstitial pulmonary edema.  There may be underlying interstitial lung disease.  The mild airspace disease is present at the right lung base and probably in the retrocardiac region suggesting alveolar edema.  IMPRESSION:  1.  Constellation of findings compatible with mild CHF. 2.  Question underlying interstitial lung disease.  Original Report Authenticated By: Andreas Newport, M.D.     No diagnosis found.   Date: 11/09/2011  Rate: 165  Rhythm: atrial fibrillation  QRS Axis: normal  Intervals: normal  ST/T Wave abnormalities: nonspecific ST/T changes  Conduction Disutrbances:left bundle branch block  Narrative Interpretation: Atrial fibrillation with rapid ventricular response. When compared with ECG of 09/15/2011, rate has increased by 61 beats per minute  Old EKG Reviewed: changes noted  She was given intravenous diltiazem. Reevaluation at 9:45 AM: She is resting comfortably now. Heart rate has decreased to 110-120.  Reevaluation at 12:15 PM: Heart rate is now down to 90-100. Consultation is being requested with triad hospitalists who will see her in the emergency department.  CRITICAL CARE Performed by: Dione Booze   Total critical care time: 60 minutes  Critical care time was exclusive of separately billable procedures and treating other patients.  Critical care was necessary to treat or prevent imminent or life-threatening deterioration.  Critical care was time spent personally by me on the following activities: development of treatment plan with patient and/or surrogate as well as nursing, discussions with consultants, evaluation of patient's response to treatment, examination of patient, obtaining history from patient or surrogate, ordering and performing treatments and interventions, ordering  and review of laboratory studies, ordering and review of radiographic studies, pulse oximetry and re-evaluation of patient's condition.  MDM  Congestive heart failure associated with recurrent atrial fibrillation with rapid ventricular response. I suspect that her atrial fibrillation is what precipitated her congestive heart failure.         Dione Booze, MD 11/09/11 [phone]

## 2011-11-09 NOTE — ED Notes (Signed)
Family at bedside. 

## 2011-11-09 NOTE — ED Notes (Signed)
Patient denies pain and is resting comfortably.  

## 2011-11-09 NOTE — ED Notes (Signed)
Family at bedside. Informed patient and/or family of status. Awaiting bed assignment. Remains Afib on monitor. VSS.

## 2011-11-09 NOTE — ED Notes (Signed)
Report received, assumed care. Diet received

## 2011-11-09 NOTE — ED Notes (Signed)
Inserted 14 Fr Foley Catheter using sterile technique. Clear urine return, pt tolerated well

## 2011-11-09 NOTE — ED Notes (Signed)
Pt undressed and placed in gown. Pt placed on cardiac monitor, bp cuff, and pulse ox. Dr.Glick aware of HR.

## 2011-11-09 NOTE — ED Notes (Signed)
Per EMS, pt called out for weakness and SOB sudden onset. Pt was initially 83% on RA. Pt given 10mg  albuterol and 1 mg atrovent. 125 solu-medrol. 2 nitro, 50 lasix. Pt still working hard to breathe. Rales throughout. Pt speaking in short sentences. Chest pressure

## 2011-11-10 ENCOUNTER — Inpatient Hospital Stay (HOSPITAL_COMMUNITY): Payer: MEDICARE

## 2011-11-10 LAB — CBC
HCT: 37.3 % (ref 36.0–46.0)
Hemoglobin: 12.5 g/dL (ref 12.0–15.0)
MCH: 30.5 pg (ref 26.0–34.0)
MCHC: 33.5 g/dL (ref 30.0–36.0)
MCV: 91 fL (ref 78.0–100.0)
Platelets: 181 K/uL (ref 150–400)
RBC: 4.1 MIL/uL (ref 3.87–5.11)
RDW: 13 % (ref 11.5–15.5)
WBC: 11 K/uL — ABNORMAL HIGH (ref 4.0–10.5)

## 2011-11-10 LAB — COMPREHENSIVE METABOLIC PANEL
BUN: 17 mg/dL (ref 6–23)
CO2: 28 mEq/L (ref 19–32)
Calcium: 9.2 mg/dL (ref 8.4–10.5)
Creatinine, Ser: 0.6 mg/dL (ref 0.50–1.10)
GFR calc Af Amer: 90 mL/min — ABNORMAL LOW (ref >90)
GFR calc non Af Amer: 77 mL/min — ABNORMAL LOW (ref >90)
Glucose, Bld: 204 mg/dL — ABNORMAL HIGH (ref 70–99)

## 2011-11-10 LAB — URINE CULTURE: Culture  Setup Time: 201212081319

## 2011-11-10 LAB — PHOSPHORUS: Phosphorus: 3.8 mg/dL (ref 2.3–4.6)

## 2011-11-10 LAB — PROTIME-INR
INR: 1.96 — ABNORMAL HIGH (ref 0.00–1.49)
Prothrombin Time: 22.7 s — ABNORMAL HIGH (ref 11.6–15.2)

## 2011-11-10 LAB — HEMOGLOBIN A1C
Hgb A1c MFr Bld: 6.3 % — ABNORMAL HIGH
Mean Plasma Glucose: 134 mg/dL — ABNORMAL HIGH

## 2011-11-10 LAB — LIPID PANEL
LDL Cholesterol: 58 mg/dL (ref 0–99)
Total CHOL/HDL Ratio: 2.1 RATIO
VLDL: 19 mg/dL (ref 0–40)

## 2011-11-10 LAB — PRO B NATRIURETIC PEPTIDE: Pro B Natriuretic peptide (BNP): 3991 pg/mL — ABNORMAL HIGH (ref 0–450)

## 2011-11-10 LAB — CARDIAC PANEL(CRET KIN+CKTOT+MB+TROPI)
CK, MB: 2.7 ng/mL (ref 0.3–4.0)
CK, MB: 3.4 ng/mL (ref 0.3–4.0)
Total CK: 35 U/L (ref 7–177)
Troponin I: <0.3 ng/mL (ref <0.30)

## 2011-11-10 MED ORDER — METFORMIN HCL 500 MG PO TABS
500.0000 mg | ORAL_TABLET | Freq: Two times a day (BID) | ORAL | Status: DC
  Filled 2011-11-10 (×6): qty 1

## 2011-11-10 MED ORDER — FUROSEMIDE 10 MG/ML IJ SOLN
20.0000 mg | Freq: Once | INTRAMUSCULAR | Status: AC
  Administered 2011-11-10: 20 mg via INTRAVENOUS

## 2011-11-10 MED ORDER — METOPROLOL SUCCINATE ER 50 MG PO TB24
75.0000 mg | ORAL_TABLET | Freq: Every day | ORAL | Status: DC
  Administered 2011-11-11 – 2011-11-12 (×2): 75 mg via ORAL
  Filled 2011-11-10 (×3): qty 1

## 2011-11-10 MED ORDER — ALBUTEROL SULFATE (5 MG/ML) 0.5% IN NEBU: 2.5000 mg | INHALATION_SOLUTION | Freq: Four times a day (QID) | RESPIRATORY_TRACT | Status: DC | PRN

## 2011-11-10 MED ORDER — ISOSORBIDE MONONITRATE ER 30 MG PO TB24
30.0000 mg | ORAL_TABLET | Freq: Every day | ORAL | Status: DC
  Administered 2011-11-10 – 2011-11-12 (×3): 30 mg via ORAL
  Filled 2011-11-10 (×3): qty 1

## 2011-11-10 MED ORDER — LEVOTHYROXINE SODIUM 100 MCG PO TABS
100.0000 ug | ORAL_TABLET | Freq: Every day | ORAL | Status: DC
  Administered 2011-11-11 – 2011-11-12 (×2): 100 ug via ORAL
  Filled 2011-11-10 (×3): qty 1

## 2011-11-10 NOTE — Progress Notes (Signed)
Physical Therapy Evaluation Patient Details Name: Kathleen Mccarty MRN: [id number] DOB: [birth year] Today's Date: 11/10/2011  Problem List:  Patient Active Problem List  Diagnoses  . HYPOTHYROIDISM NOS  . DIABETES-TYPE 2  . HYPERLIPIDEMIA NEC/NOS  . HYPERTENSION  . CAD  . ALLERGIC RHINITIS  . LEG PAIN, RIGHT  . SYNCOPE  . Flash pulmonary edema  . A-fib  . DM type 2 (diabetes mellitus, type 2)  . CAD (coronary artery disease)  . HTN (hypertension)  . Hypothyroidism    Past Medical History:  Past Medical History  Diagnosis Date  . Dysrhythmia   . Cancer   . Arthritis   . History of thyroidectomy    Past Surgical History:  Past Surgical History  Procedure Date  . Breast surgery   . Abdominal hysterectomy     PT Assessment/Plan/Recommendation PT Assessment Clinical Impression Statement: Patient is a 77 yo female admitted with flash pulmonary edema.  Patient with general weakness and decreased balance impacting functional mobility.  Patient has personal care assistant and a daughter who provide her assistance at home.  Recommend HHPT at discharge for continued therapy. PT Recommendation/Assessment: Patient will need skilled PT in the acute care venue PT Problem List: Decreased strength;Decreased activity tolerance;Decreased balance;Decreased mobility;Decreased knowledge of use of DME;Cardiopulmonary status limiting activity PT Therapy Diagnosis : Difficulty walking;Generalized weakness PT Plan PT Frequency: Min 3X/week PT Treatment/Interventions: DME instruction;Gait training;Stair training;Functional mobility training;Therapeutic exercise;Balance training;Patient/family education PT Recommendation Follow Up Recommendations: Home health PT Equipment Recommended: None recommended by PT PT Goals  Acute Rehab PT Goals PT Goal Formulation: With patient Time For Goal Achievement: 7 days Pt will go Supine/Side to Sit: Independently;with HOB 0 degrees PT Goal:  Supine/Side to Sit - Progress: Not met Pt will go Sit to Supine/Side: Independently;with HOB 0 degrees PT Goal: Sit to Supine/Side - Progress: Not met Pt will go Sit to Stand: with modified independence PT Goal: Sit to Stand - Progress: Not met Pt will go Stand to Sit: with modified independence PT Goal: Stand to Sit - Progress: Not met Pt will Ambulate: >150 feet;with modified independence;with least restrictive assistive device PT Goal: Ambulate - Progress: Not met Pt will Go Up / Down Stairs: 3-5 stairs;with min assist;with least restrictive assistive device PT Goal: Up/Down Stairs - Progress: Not met  PT Evaluation Precautions/Restrictions  Precautions Precaution Comments: Patient has decreased vision (unable to see in right eye). Prior Functioning  Home Living Lives With: Alone Receives Help From: Personal care attendant (4 days/week personal care attendant; Daughter 2 days/week) Type of Home: House Home Layout: One level Home Access: Stairs to enter Entrance Stairs-Rails: Right Entrance Stairs-Number of Steps: 3 Bathroom Shower/Tub: Health visitor: Standard (Tried elevated seat - too high) Bathroom Accessibility: Yes How Accessible: Accessible via walker Home Adaptive Equipment: Straight cane;Built-in shower seat;Walker - rolling Prior Function Level of Independence: Requires assistive device for independence;Independent with basic ADLs;Independent with gait;Needs assistance with homemaking Meal Prep: Moderate Light Housekeeping: Total Driving: No Cognition Cognition Arousal/Alertness: Awake/alert Overall Cognitive Status: Appears within functional limits for tasks assessed Sensation/Coordination   Extremity Assessment RUE Assessment RUE Assessment: Within Functional Limits LUE Assessment LUE Assessment: Within Functional Limits RLE Assessment RLE Assessment: Within Functional Limits (Note general weakness throughout) LLE Assessment LLE  Assessment: Within Functional Limits Mobility (including Balance) Bed Mobility Bed Mobility: Yes Supine to Sit: With rails;HOB elevated (Comment degrees);4: Min assist Supine to Sit Details (indicate cue type and reason): Cues for technique.  Assist to raise trunk from  bed. Sitting - Scoot to Edge of Bed: 5: Supervision Sitting - Scoot to [place] of Bed Details (indicate cue type and reason): Cues to scoot one hip forward at a time Sit to Supine - Left: 4: Min assist;With rail;HOB elevated (comment degrees) Sit to Supine - Left Details (indicate cue type and reason): Cues for technique.  Physical assist to raise LE's to bed Transfers Transfers: Yes Sit to Stand: 5: Supervision;With upper extremity assist;From bed Sit to Stand Details (indicate cue type and reason): Cues for safe hand placement Stand to Sit: 5: Supervision;With upper extremity assist;To bed Stand to Sit Details: Cues for safe technique Ambulation/Gait Ambulation/Gait: Yes Ambulation/Gait Assistance: 5: Supervision Ambulation/Gait Assistance Details (indicate cue type and reason): Cues to keep RW close to body. Ambulation Distance (Feet): 68 Feet Assistive device: Rolling walker Gait Pattern: Step-through pattern;Decreased stride length;Trunk flexed Gait velocity: Slow gait speed Stairs: No  Balance Balance Assessed: No Exercise    End of Session PT - End of Session Equipment Utilized During Treatment: Gait belt Activity Tolerance: Patient limited by fatigue Patient left: in bed;with call bell in reach Nurse Communication: Mobility status for ambulation General Behavior During Session: [facility] for tasks performed Cognition: [facility] for tasks performed  Vena Austria [phone] 11/10/2011, 5:32 PM

## 2011-11-10 NOTE — H&P (Signed)
PCP:   Loreen Freud, DO, DO   Chief Complaint:  Weakness, chest pressure for one day.  HPI: Ms Enloe presentated with weakness and chest pressure which started early Sartuday morning. She says she went to bed ok night before but when she got up in the morning she realized she was too weak to start moving. She felt pressure and congestion in the chest. She believes she may have eaten too much salt from the tuna she had for dinner. EMS gave her NTG with easing of the chest pressure. However, she was found to be in Afib with RVR in ED and started on cardizem drip. Her ventricular rate was around 105 at the time of my evaluation. She sees a cardiologist at [facility] and she was admitted there in October for similar complaints. She stayed in the hospital for 2 days but professes ignorance regarding cardiac work up done there. She was told she has mitral valve prolapse. She has "a heart monitor in her chest- says it's not a pacemaker". She has continued to take coumadin and her INR was therapeutic on admission. She denies cough or fever or diarrhea. She had some leg swelling which improved with diuresis, and she has voided more than 350 cc since she got lasix. She is worried something maybe wrong with her heart. She got 100% better with ER interventions, including lasix. Her chest Xray suggested congestion with possible chronic fibrosis.  Review of Systems:  The patient denies anorexia, fever, weight loss,, vision loss, decreased hearing, hoarseness, balance deficits, hemoptysis, abdominal pain, melena, hematochezia, severe indigestion/heartburn, hematuria, incontinence, genital sores, muscle weakness, suspicious skin lesions, transient blindness, difficulty walking, depression, unusual weight change, abnormal bleeding, enlarged lymph nodes, angioedema, and breast masses.  Past Medical History: Past Medical History  Diagnosis Date  . Dysrhythmia   . Cancer   . Arthritis   . History of thyroidectomy     Past Surgical History  Procedure Date  . Breast surgery   . Abdominal hysterectomy     Medications: Prior to Admission medications   Medication Sig Start Date End Date Taking? Authorizing Provider  acetaminophen (TYLENOL) 500 MG tablet Take 1,000 mg by mouth 2 (two) times daily.     Yes Historical Provider, MD  dorzolamide-timolol (COSOPT) 22.3-6.8 MG/ML ophthalmic solution Place 1 drop into both eyes 2 (two) times daily.     Yes Historical Provider, MD  isosorbide mononitrate (IMDUR) 60 MG 24 hr tablet Take 60 mg by mouth daily.     Yes Historical Provider, MD  levothyroxine (SYNTHROID, LEVOTHROID) 125 MCG tablet Take 125 mcg by mouth daily.     Yes Historical Provider, MD  metoprolol (TOPROL-XL) 50 MG 24 hr tablet Take 50 mg by mouth daily.     Yes Historical Provider, MD  Multiple Vitamins-Minerals (ICAPS AREDS FORMULA PO) Take 2 tablets by mouth 2 (two) times daily.     Yes Historical Provider, MD  PARoxetine (PAXIL-CR) 12.5 MG 24 hr tablet Take 12.5 mg by mouth daily.     Yes Historical Provider, MD  simvastatin (ZOCOR) 40 MG tablet Take 40 mg by mouth at bedtime.     Yes Historical Provider, MD  Travoprost, BAK Free, (TRAVATAN) 0.004 % SOLN ophthalmic solution Place 1 drop into the left eye at bedtime.     Yes Historical Provider, MD  warfarin (COUMADIN) 2.5 MG tablet Take 2.5 mg by mouth 4 (four) times a week. Sunday, Monday, Wednesday, Friday    Yes Historical Provider, MD  warfarin (COUMADIN)  3 MG tablet Take 4.5 mg by mouth every Tuesday, Thursday, and Saturday at 6 PM.     Yes Historical Provider, MD    Allergies:   Allergies  Allergen Reactions  . Amoxicillin     Hard on stomach  . Codeine     Unsure of reaction, may have been rash or may have been confusion  . Eggs Or Egg-Derived Products   . Iodine   . Shellfish Allergy   . Adhesive (Tape) Rash    Sensitive to bandages    Social History:  reports that she has never smoked. She does not have any smokeless  tobacco history on file. She reports that she does not drink alcohol or use illicit drugs.  Family History: History reviewed. No pertinent family history.  Physical Exam: Filed Vitals:   11/09/11 1716 11/09/11 1829 11/09/11 1841 11/09/11 2126  BP: 121/70 153/78  121/78  Pulse: 102 113  98  Temp:   98.1 F (36.7 C) 98.6 F (37 C)  TempSrc:    Oral  Resp: 20 21  20   Height:   5\' 3"  (1.6 m)   Weight:   86.7 kg (191 lb 2.2 oz)   SpO2: 97% 95%  93%   General appearance: alert, cooperative and no distress Eyes: conjunctivae/corneas clear. PERRL, EOM's intact. Fundi benign. Neck: no adenopathy, no carotid bruit, no JVD, supple, symmetrical, trachea midline and thyroid not enlarged, symmetric, no tenderness/mass/nodules Resp: bilateral basilar velcro rales. Cardio: S1S2. irregularly irregular rhythm. Systolic murmur throughout precordium. GI: soft, non-tender; bowel sounds normal; no masses,  no organomegaly Extremities: extremities normal, atraumatic, no cyanosis or edema Pulses: 2+ and symmetric Neurologic: Grossly normal   Labs on Admission:   Basename 11/09/11 1833 11/09/11 0900  NA -- 140  K -- 3.7  CL -- 105  CO2 -- 24  GLUCOSE -- 209*  BUN -- 18  CREATININE -- 0.54  CALCIUM -- 9.3  MG 1.9 --  PHOS 3.4 --   No results found for this basename: AST:2,ALT:2,ALKPHOS:2,BILITOT:2,PROT:2,ALBUMIN:2 in the last 72 hours No results found for this basename: LIPASE:2,AMYLASE:2 in the last 72 hours  Basename 11/09/11 0900  WBC 9.7  NEUTROABS --  HGB 14.0  HCT 40.8  MCV 91.7  PLT 186    Basename 11/09/11 1833 11/09/11 0900  CKTOTAL 35 43  CKMB 2.9 3.2  CKMBINDEX -- --  TROPONINI <0.30 <0.30   No results found for this basename: TSH,T4TOTAL,FREET3,T3FREE,THYROIDAB in the last 72 hours No results found for this basename: VITAMINB12:2,FOLATE:2,FERRITIN:2,TIBC:2,IRON:2,RETICCTPCT:2 in the last 72 hours  Radiological Exams on Admission: Chest Portable 1 View  11/09/2011   *RADIOLOGY REPORT*  Clinical Data: Short of breath.  Chest pain.  Atrial fibrillation. Mitral valve prolapse.  PORTABLE CHEST - 1 VIEW  Comparison: 09/15/2011.  Findings: Cardiomegaly is present.  Event recorder is present in the left chest.  Aortic arch atherosclerosis.  Pulmonary vascular congestion and interstitial pulmonary edema.  There may be underlying interstitial lung disease.  The mild airspace disease is present at the right lung base and probably in the retrocardiac region suggesting alveolar edema.  IMPRESSION:  1.  Constellation of findings compatible with mild CHF. 2.  Question underlying interstitial lung disease.  Original Report Authenticated By: Andreas Newport, M.D.    Assessment Flash pulmonary edema in otherwise healthy 77 year old female with afib on coumadin. She has afib with rvr, responding to cardizem, and BP stable. Concern would be for ACS causing the edema, otherwise perhaps just excess fluid  retention from extra salt intake night before admission. Patient not on diuretic prior to episode, wonder what her ef is. Plan .Flash pulmonary edema- lasix/romi/ continue beta blocker/cardizem, ?try get records from [facility] in the week. Consider cards consult depending on findings. .A-fib with rvr- lopressor/cardizem/coumadin, target inr 2-3. Pharmacy input appreciated. .DM type 2 (diabetes mellitus, type 2)- seems stable. Monitor. Marland KitchenCAD (coronary artery disease)- workup as for flash pulmonary edema. Marland KitchenHTN (hypertension)- uncontrolled- loppressor/cardizem. Marland KitchenHypothyroidism on synthroid. Gi prophylaxis. Condition guarded  Lotta Frankenfield,[id number]. 11/10/2011, 12:00 AM

## 2011-11-10 NOTE — Progress Notes (Signed)
Kathleen Mccarty [id number] is a 77 y.o. female,  Outpatient Primary MD for the patient is Kathleen Freud, DO, DO  Chief Complaint  Patient presents with  . Shortness of Breath        Subjective:   Kathleen Mccarty today has, No headache, No chest pain, No abdominal pain - No Nausea, No new weakness tingling or numbness, No Cough - SOB.    Objective:   Filed Vitals:   11/09/11 1829 11/09/11 1841 11/09/11 2126 11/10/11 0457  BP: 153/78  121/78 106/67  Pulse: 113  98 84  Temp:  98.1 F (36.7 C) 98.6 F (37 C) 97.5 F (36.4 C)  TempSrc:   Oral Oral  Resp: 21  20 18   Height:  5\' 3"  (1.6 m)    Weight:  86.7 kg (191 lb 2.2 oz)  83.8 kg (184 lb 11.9 oz)  SpO2: 95%  93% 94%    Wt Readings from Last 3 Encounters:  11/10/11 83.8 kg (184 lb 11.9 oz)  07/20/07 82.668 kg (182 lb 4 oz)  05/21/07 82.668 kg (182 lb 4 oz)     Intake/Output Summary (Last 24 hours) at 11/10/11 1301 Last data filed at 11/10/11 0900  Gross per 24 hour  Intake   2035 ml  Output   3426 ml  Net  -1391 ml    Exam Awake Alert, Oriented *3, No new F.N deficits, Normal affect [place].AT,PERRAL Supple Neck,No JVD, No cervical lymphadenopathy appriciated.  Symmetrical Chest wall movement, Good air movement bilaterally, Few Rales RRR,No Gallops,Rubs or new Murmurs, No Parasternal Heave +ve B.Sounds, Abd Soft, Non tender, No organomegaly appriciated, No rebound -guarding or rigidity. No Cyanosis, Clubbing , trace edema, No new Rash or bruise   Data Review  CBC  Lab 11/10/11 0200 11/09/11 0900  WBC 11.0* 9.7  HGB 12.5 14.0  HCT 37.3 40.8  PLT 181 186  MCV 91.0 91.7  MCH 30.5 31.5  MCHC 33.5 34.3  RDW 13.0 13.1  LYMPHSABS -- --  MONOABS -- --  EOSABS -- --  BASOSABS -- --  BANDABS -- --    Chemistries   Lab 11/10/11 0200 11/09/11 1833 11/09/11 0900  NA 144 -- 140  K 4.0 -- 3.7  CL 104 -- 105  CO2 28 -- 24  GLUCOSE 204* -- 209*  BUN 17 -- 18  CREATININE 0.60 -- 0.54  CALCIUM  9.2 -- 9.3  MG 2.2 1.9 --  AST 14 -- --  ALT 12 -- --  ALKPHOS 64 -- --  BILITOT 0.5 -- --   ------------------------------------------------------------------------------------------------------------------ estimated creatinine clearance is 47 ml/min (by C-G formula based on Cr of 0.6). ------------------------------------------------------------------------------------------------------------------   Basename 11/10/11 0200  CHOL 144  HDL 67  LDLCALC 58  TRIG 95  CHOLHDL 2.1  LDLDIRECT --   ------------------------------------------------------------------------------------------------------------------  Basename 11/09/11 1833  TSH 0.271*  T4TOTAL --  T3FREE --  THYROIDAB --   ------------------------------------------------------------------------------------------------------------------ No results found for this basename: VITAMINB12:2,FOLATE:2,FERRITIN:2,TIBC:2,IRON:2,RETICCTPCT:2 in the last 72 hours  Coagulation profile  Lab 11/10/11 0200 11/09/11 0900  INR 1.96* 2.18*  PROTIME -- --    No results found for this basename: DDIMER:2 in the last 72 hours  Cardiac Enzymes  Lab 11/10/11 1020 11/10/11 0154 11/09/11 1833  CKMB 3.4 2.7 2.9  TROPONINI <0.30 <0.30 <0.30  MYOGLOBIN -- -- --   ------------------------------------------------------------------------------------------------------------------  Lab 11/10/11 0200 11/09/11 0900  POCBNP 3991.0* 2127.0*     Micro Results No results found for this or any previous visit (from the  past 240 hour(s)).  Radiology Reports X-ray Chest Pa And Lateral   11/10/2011  *RADIOLOGY REPORT*  Clinical Data: Follow up pulmonary edema  CHEST - 2 VIEW  Comparison: 11/09/2011  Findings: Mild chronic interstitial markings.  No frank interstitial edema.  No pleural effusion or pneumothorax.  Stable cardiomegaly.  Loop recorder.  IMPRESSION: Stable cardiomegaly with chronic interstitial markings.  No frank interstitial edema.   Original Report Authenticated By: Charline Bills, M.D.   Chest Portable 1 View  11/09/2011  *RADIOLOGY REPORT*  Clinical Data: Short of breath.  Chest pain.  Atrial fibrillation. Mitral valve prolapse.  PORTABLE CHEST - 1 VIEW  Comparison: 09/15/2011.  Findings: Cardiomegaly is present.  Event recorder is present in the left chest.  Aortic arch atherosclerosis.  Pulmonary vascular congestion and interstitial pulmonary edema.  There may be underlying interstitial lung disease.  The mild airspace disease is present at the right lung base and probably in the retrocardiac region suggesting alveolar edema.  IMPRESSION:  1.  Constellation of findings compatible with mild CHF. 2.  Question underlying interstitial lung disease.  Original Report Authenticated By: Andreas Newport, M.D.    Scheduled Meds:   . aspirin  324 mg Oral Once  . diltiazem  120 mg Oral Once  . timolol  1 drop Both Eyes BID   And  . dorzolamide  1 drop Both Eyes BID  . furosemide  40 mg Intravenous Daily  . isosorbide mononitrate  60 mg Oral Once  . levothyroxine  100 mcg Oral QAC breakfast  . metoprolol succinate  50 mg Oral Once  . metoprolol succinate  75 mg Oral Daily  . pantoprazole  40 mg Oral Q1200  . PARoxetine  12.5 mg Oral Daily  . potassium chloride      . simvastatin  40 mg Oral QHS  . sodium chloride  3 mL Intravenous Q12H  . Travoprost (BAK Free)  1 drop Left Eye QHS  . warfarin  4.5 mg Oral ONCE-1800  . DISCONTD: diltiazem  240 mg Oral Daily  . DISCONTD: diltiazem  120 mg Oral Q12H  . DISCONTD: diltiazem  120 mg Oral Q12H  . DISCONTD: dorzolamide-timolol  1 drop Both Eyes BID  . DISCONTD: dorzolamide-timolol  1 drop Both Eyes BID  . DISCONTD: isosorbide mononitrate  60 mg Oral Daily  . DISCONTD: levothyroxine  125 mcg Oral Daily  . DISCONTD: metoprolol  50 mg Oral Daily  . DISCONTD: potassium chloride  40 mEq Oral Daily  . DISCONTD: warfarin  2.5 mg Oral 4 times weekly  . DISCONTD: warfarin  4.5 mg Oral  Q T,Th,Sat-1800   Continuous Infusions:  PRN Meds:.sodium chloride, acetaminophen, acetaminophen, alum & mag hydroxide-simeth, levalbuterol, ondansetron (ZOFRAN) IV, ondansetron, sodium chloride, zolpidem  Assessment & Plan   Principal Problem:  1. Flash pulmonary edema with Afib RVR in the setting ++NA intake, ? Due to Excess Synthroid induced as TSH was low - ruled out for MI, feels much better, pain and SOB free, add Imdur, cardiac diet-Fluid restriction, Lasix 1 more dose, o2, Nebs, HOB elevated, B Blocker increased, DC Cardizem in the setting of CHF, Echo pending, D/W Dr wall cards nothing further to add.   2. A-fib - in rate control now, increased B blcoker, on coumadin, Inr daily.  Lab Results  Component Value Date   INR 1.96* 11/10/2011   INR 2.18* 11/09/2011   INR 2.24* 09/15/2011    2. DM type 2 (diabetes mellitus, type 2) - sugars>200, check A1c, add  glucophage.  3. CAD (coronary artery disease) - pain free, on ASA, B blcoker, Imdur. Ruled out.  4. HTN (hypertension) - good control on B Blocker  5. Hypothyroidism - TSH low, synthroid reduced.   DVT Prophylaxis  Coumadin  See all Orders from today for further details     Leroy Sea M.D 11/10/2011, 1:01 PM  [facility] Office  [phone]

## 2011-11-11 ENCOUNTER — Encounter (HOSPITAL_COMMUNITY): Payer: Self-pay | Admitting: Cardiology

## 2011-11-11 DIAGNOSIS — I509 Heart failure, unspecified: Secondary | ICD-10-CM

## 2011-11-11 LAB — CBC
Hemoglobin: 11.9 g/dL — ABNORMAL LOW (ref 12.0–15.0)
MCHC: 31 g/dL (ref 30.0–36.0)
MCV: 93 fL (ref 78.0–100.0)
Platelets: 165 10*3/uL (ref 150–400)
RBC: 4.13 MIL/uL (ref 3.87–5.11)
RDW: 13.2 % (ref 11.5–15.5)
WBC: 8.1 10*3/uL (ref 4.0–10.5)

## 2011-11-11 LAB — BASIC METABOLIC PANEL
CO2: 31 mEq/L (ref 19–32)
GFR calc non Af Amer: 75 mL/min — ABNORMAL LOW (ref >90)
Glucose, Bld: 136 mg/dL — ABNORMAL HIGH (ref 70–99)
Potassium: 3.1 mEq/L — ABNORMAL LOW (ref 3.5–5.1)
Sodium: 142 mEq/L (ref 135–145)

## 2011-11-11 MED ORDER — POTASSIUM CHLORIDE CRYS ER 20 MEQ PO TBCR
20.0000 meq | EXTENDED_RELEASE_TABLET | Freq: Three times a day (TID) | ORAL | Status: AC
  Administered 2011-11-11 (×3): 20 meq via ORAL
  Filled 2011-11-11 (×4): qty 1

## 2011-11-11 MED ORDER — HALOPERIDOL LACTATE 5 MG/ML IJ SOLN
1.0000 mg | Freq: Four times a day (QID) | INTRAMUSCULAR | Status: DC | PRN
  Filled 2011-11-11 (×2): qty 0.2

## 2011-11-11 MED ORDER — HALOPERIDOL LACTATE 5 MG/ML IJ SOLN
1.0000 mg | Freq: Four times a day (QID) | INTRAMUSCULAR | Status: DC | PRN
  Filled 2011-11-11: qty 0.2

## 2011-11-11 MED ORDER — LORAZEPAM 2 MG/ML IJ SOLN: 1.0000 mg | Freq: Four times a day (QID) | INTRAMUSCULAR | Status: DC | PRN

## 2011-11-11 MED ORDER — LORAZEPAM 1 MG PO TABS: 1.0000 mg | ORAL_TABLET | Freq: Four times a day (QID) | ORAL | Status: DC | PRN

## 2011-11-11 MED ORDER — ICAPS MV PO TABS
2.0000 | ORAL_TABLET | Freq: Two times a day (BID) | ORAL | Status: DC
  Administered 2011-11-11 – 2011-11-12 (×2): 2 via ORAL
  Filled 2011-11-11 (×3): qty 2

## 2011-11-11 MED ORDER — HALOPERIDOL 1 MG PO TABS
1.0000 mg | ORAL_TABLET | Freq: Four times a day (QID) | ORAL | Status: DC | PRN
  Filled 2011-11-11: qty 1

## 2011-11-11 MED ORDER — WARFARIN SODIUM 5 MG PO TABS
5.0000 mg | ORAL_TABLET | Freq: Once | ORAL | Status: AC
  Administered 2011-11-11: 5 mg via ORAL
  Filled 2011-11-11: qty 1

## 2011-11-11 MED ORDER — FUROSEMIDE 40 MG PO TABS
40.0000 mg | ORAL_TABLET | Freq: Every day | ORAL | Status: DC
  Administered 2011-11-11 – 2011-11-12 (×2): 40 mg via ORAL
  Filled 2011-11-11 (×2): qty 1

## 2011-11-11 NOTE — Consult Note (Signed)
CARDIOLOGY CONSULT NOTE  Patient ID: Kathleen Mccarty, MRN: [id number], DOB/AGE: August 21, 1918 77 y.o. Admit date: 11/09/2011 Date of Consult: 11/11/2011  Primary Physician: Johna Roles MD Primary Cardiologist: Rowan Blase MD  Chief Complaint: Weakness, chest pressure/congestion Reason for Consultation: Acute CHF exacerbation  HPI: 89yof w/ PMHx significant for atrial fibrillation (on coumadin, implanted loop monitor), hypertension, hyperlipidemia, and hypothyroidism (s/p thyroidectomy, on synthroid) who presented to [facility] on 11/09/11 with weakness and chest pressure/congestion.   She reports being in her usual state of health when she woke up Saturday (11/09/11) morning around 5am to get something to drink then went back to sleep and got up around 6am. When she sat up she noticed she was weak and unable to stand up so she pressed her alert button and EMS was called. She denies focal neurological deficits, chest pain, shortness of breath, or palpitations. Her daughter reported that she has been more short of breath over the last couple of months, but worse in the last couple of days. She did mention eating a lot of tuna for dinner the night before. She denies a history of congestive heart failure. She was having problems with syncope in early 2011 and had a loop monitor implanted at that time and was found to have atrial fibrillation for which she takes Coumadin.  In the ED she was found to be in A. Fib w/ RVR and started on a cardizem drip. Her BNP was 2127 and CXR showed pulmonary vascular congestion and interstitial pulmonary edema consistent with mild CHF. Cardiac enzymes were negative. TSH was low at 0.271.  She was admitted for flash pulmonary edema and has been diuresing well on oral lasix. Her atrial fibrillation is rate controlled on an oral beta blocker. Her blood sugars have been >200, her A1C was found to be 6.3, and she was initiated on Metformin. She denies any history  of glucose intolerance or diabetes.    Diagnosis  . Dysrhythmia  . Cancer  . Arthritis  . History of thyroidectomy  . Hypertension  . Hyperlipidemia  . Atrial fibrillation     Surgical History:  Procedure  . Breast surgery  . Abdominal hysterectomy    Home Meds: Medication Sig  acetaminophen (TYLENOL) 500 MG tablet Take 1,000 mg by mouth 2 (two) times daily.    dorzolamide-timolol (COSOPT) 22.3-6.8 MG/ML ophthalmic solution Place 1 drop into both eyes 2 (two) times daily.    isosorbide mononitrate (IMDUR) 60 MG 24 hr tablet Take 60 mg by mouth daily.    levothyroxine (SYNTHROID, LEVOTHROID) 125 MCG tablet Take 125 mcg by mouth daily.    metoprolol (TOPROL-XL) 50 MG 24 hr tablet Take 50 mg by mouth daily.    Multiple Vitamin (MULTIVITAMIN) tablet Take 2 tablets by mouth daily. Pt vitamin is I-caps Multi Vitamin takes 2 tabs every am and 2 caps every pm   Multiple Vitamins-Minerals (ICAPS AREDS FORMULA PO) Take 2 tablets by mouth 2 (two) times daily.    OVER THE COUNTER MEDICATION    PARoxetine (PAXIL-CR) 12.5 MG 24 hr tablet Take 12.5 mg by mouth daily.    simvastatin (ZOCOR) 40 MG tablet Take 40 mg by mouth at bedtime.    Travoprost, BAK Free, (TRAVATAN) 0.004 % SOLN ophthalmic solution Place 1 drop into the left eye at bedtime.    warfarin (COUMADIN) 2.5 MG tablet Take 2.5 mg by mouth 4 (four) times a week. Sunday, Monday, Wednesday, Friday   warfarin (COUMADIN) 3 MG tablet Take 4.5  mg by mouth every Tuesday, Thursday, and Saturday at 6 PM.     Inpatient Medications:   . aspirin  324 mg Oral Once  . timolol  1 drop Both Eyes BID   And  . dorzolamide  1 drop Both Eyes BID  . furosemide  40 mg Oral Daily  . ICaps MV  2 tablet Oral BID  . isosorbide mononitrate  30 mg Oral Daily  . levothyroxine  100 mcg Oral QAC breakfast  . metFORMIN  500 mg Oral BID WC  . metoprolol succinate  75 mg Oral Daily  . pantoprazole  40 mg Oral Q1200  . PARoxetine  12.5 mg Oral Daily  .  potassium chloride  20 mEq Oral TID  . simvastatin  40 mg Oral QHS  . sodium chloride  3 mL Intravenous Q12H  . Travoprost (BAK Free)  1 drop Left Eye QHS  . warfarin  4.5 mg Oral ONCE-1800  . warfarin  5 mg Oral ONCE-1800   Allergies:  Allergies  Allergen Reactions  . Amoxicillin     Hard on stomach  . Codeine     Unsure of reaction, may have been rash or may have been confusion  . Eggs Or Egg-Derived Products   . Iodine   . Shellfish Allergy   . Adhesive (Tape) Rash    Sensitive to bandages   History   Social History  . Marital Status: Widowed   Occupational History  . Retired   Social History Main Topics  . Smoking status: Never Smoker   . Alcohol Use: No  . Drug Use: No   Social History Narrative  . Lives alone with home health care four days a week     Family history:  No pertinent family history  Review of Systems: General: negative for chills, fever, night sweats or weight changes.  Cardiovascular: shortness of breath, dyspnea on exertion; negative for chest pain, edema, orthopnea, palpitations, or paroxysmal nocturnal dyspnea Dermatological: negative for rash Respiratory: negative for cough or wheezing Urologic: negative for hematuria Abdominal: negative for nausea, vomiting, diarrhea, bright red blood per rectum, melena, or hematemesis Neurologic: Generalized weakness; negative for visual changes, syncope, or dizziness All other systems reviewed and are otherwise negative except as noted above.  Labs:   11/09/2011 09:00 11/10/2011 02:00  Pro B Natriuretic peptide (BNP) 2127.0 (H) 3991.0 (H)   Basename 11/10/11 1020 11/10/11 0154 11/09/11 1833 11/09/11 0900  CKTOTAL 45 35 35 43  CKMB 3.4 2.7 2.9 3.2  TROPONINI <0.30 <0.30 <0.30 <0.30   Lab Results  Component Value Date   WBC 8.1 11/11/2011   HGB 11.9* 11/11/2011   HCT 38.4 11/11/2011   MCV 93.0 11/11/2011   PLT 165 11/11/2011    Lab 11/11/11 0613 11/10/11 0200  NA 142 --  K 3.1* --  CL 100 --   CO2 31 --  BUN 22 --  CREATININE 0.65 --  CALCIUM 9.0 --  PROT -- 6.7  BILITOT -- 0.5  ALKPHOS -- 64  ALT -- 12  AST -- 14  GLUCOSE 136* --   Lab Results  Component Value Date   CHOL 144 11/10/2011   HDL 67 11/10/2011   LDLCALC 58 11/10/2011   TRIG 95 11/10/2011     11/09/2011 09:00 11/10/2011 02:00 11/11/2011 06:13  Prothrombin Time 24.6 (H) 22.7 (H) 21.1 (H)  INR 2.18 (H) 1.96 (H) 1.79 (H)     11/10/2011 13:30  Hemoglobin A1C 6.3 (H)     11/09/2011 18:33  TSH 0.271 (L)    Radiology/Studies:  11/11/11 - 2D Echocardiogram - Left ventricle: The cavity size was normal. There was mild concentric hypertrophy. The estimated ejection fraction was 55%. Wall motion was normal; there were no regional wall motion abnormalities. Left ventricular diastolic function parameters were normal.  - Ventricular septum: Septal motion showed paradox. - Aortic valve: Moderate diffuse thickening and calcification. There was very mild stenosis. Valve area: 1.11cm^2(VTI). Valve area: 1.07cm^2 (Vmax). - Mitral valve: Moderate regurgitation directed eccentrically, toward the septum, and along the left atrial wall. - Left atrium: The atrium was moderately to severely dilated.  X-ray Chest Pa And Lateral  11/10/2011  Findings: Mild chronic interstitial markings.  No frank interstitial edema.  No pleural effusion or pneumothorax.  Stable cardiomegaly.  Loop recorder.  IMPRESSION: Stable cardiomegaly with chronic interstitial markings.  No frank interstitial edema.  Chest Portable 1 View 11/09/2011  Findings: Cardiomegaly is present.  Event recorder is present in the left chest.  Aortic arch atherosclerosis.  Pulmonary vascular congestion and interstitial pulmonary edema.  There may be underlying interstitial lung disease.  The mild airspace disease is present at the right lung base and probably in the retrocardiac region suggesting alveolar edema.  IMPRESSION:  1.  Constellation of findings compatible with mild  CHF. 2.  Question underlying interstitial lung disease.   EKG: 11/09/11 @ 0837 - Atrial fibrillation w/ RVR, 165bpm  Telemetry: Atrial fibrillation 70s-80s  Physical Exam: Blood pressure 112/71, pulse 78, temperature 98.4 F (36.9 C), temperature source Oral, resp. rate 20, height 5\' 3"  (1.6 m), weight 83.6 kg (184 lb 4.9 oz), SpO2 94.00%. General: Obese elderly white female in no acute distress. Head: Normocephalic, atraumatic, sclera non-icteric, no xanthomas, nares are without discharge.  Neck: Supple. Negative for carotid bruits. JVD not elevated. Lungs: Bibasilar rales. No wheezes or rhonchi. Breathing is unlabored. Heart: Irregular rate and rhythm with S1 S2. 2/6 systolic murmur throughout precordium. No rubs or gallops appreciated. Abdomen: Soft, non-tender, non-distended with normoactive bowel sounds. No rebound/guarding. No obvious abdominal masses. Msk:  Strength and tone appear normal for age. Extremities: Trace bilat pedal edema. No clubbing or cyanosis. Distal pedal pulses are 2+ and equal bilaterally. Neuro: Alert and oriented X 3. Moves all extremities spontaneously. Psych:  Responds to questions appropriately with a normal affect.    Assessment and Plan:  89yof w/ PMHx significant for atrial fibrillation (on coumadin, implanted loop monitor), hypertension, hyperlipidemia, and hypothyroidism (s/p thyroidectomy) who presented to [facility] on 11/09/11 with weakness and chest pressure/congestion and admitted for flash pulmonary edema. Cardiac enzymes were cycled and were negative.  1. Acute CHF exacerbation: Possible etiologies include atrial fibrillation with RVR, increased Na+ intake, and excess synthroid. CXR shows improvement of interstitial edema. She has been diuresing well on oral lasix. Echo showed EF 55% with normal LV diastolic function. Mild bibasilar rales on exam. - Continue oral Lasix, Imdur, and BB - Consider Add ACE-inhibitor - Strict I/Os and daily  weights  2. Hypokalemia: Continue K+ Supplementation and monitor BMET   3. Atrial Fibrillation: Rate controlled on an oral beta blocker.  - Continue beta blocker and coumadin  4. Anticoagulation:  On coumadin. INR subtherapeutic. - Coumadin per pharmacy recommendations  5. Elevated glucose: Patient denies history of diabetes or glucose intolerance. A1C 6.3 and multiple glucoses > 200. Metformin started per primary team. - Consider Adding ACE-inhibitor  6. HTN: Stable. Cont current antihypertensives  7. HLD: LDL 58. Cont statin   Signed, HOPE, JESSICA PA-C  11/11/2011, 3:53 PM   Attending Note:   The patient was seen and examined.  Agree with assessment and plan as noted above.  Patient is a very pleasant elderly female with a hx of A-Fib.  She was admitted with AF with RVR and subsequent decompensation.  She was also found to be overreplaced with her synthroid.   Her rate is now controlled with additional metoprolol.  Her synthroid has been decreased.  Exam: lungs - few basilar rales cor: Irreg. , soft systolic murmur Ext: not edema  She appears to be stable for DC tomorrow.  She should see her regular cardiologist soon.  Patient is refusing to take Metformin.  i agree that she does not have the diagnosis of Diabetes yet and the elevated glucose levels may be due to stress.  I did advise her to cut back on her carbs ( anything white, wheat, or sweat) and to exercise more.    Vesta Mixer, Montez Hageman., MD, [facility] 11/11/2011, 4:41 PM

## 2011-11-11 NOTE — Plan of Care (Signed)
Problem: Phase I Progression Outcomes Goal: EF % per last Echo/documented,Core Reminder form on chart Outcome: Completed/Met Date Met:  11/11/11 EF 55 %

## 2011-11-11 NOTE — Progress Notes (Signed)
Utilization Review Completed.  Kathleen Mccarty  11/11/2011 

## 2011-11-11 NOTE — Progress Notes (Signed)
ANTICOAGULATION CONSULT NOTE - Follow Up Consult  Pharmacy Consult for Coumadin Indication: atrial fibrillation  Allergies  Allergen Reactions  . Amoxicillin     Hard on stomach  . Codeine     Unsure of reaction, may have been rash or may have been confusion  . Eggs Or Egg-Derived Products   . Iodine   . Shellfish Allergy   . Adhesive (Tape) Rash    Sensitive to bandages    Patient Measurements: Height: 5\' 3"  (160 cm) Weight: 184 lb 4.9 oz (83.6 kg) IBW/kg (Calculated) : 52.4  Adjusted Body Weight:   Vital Signs: Temp: 98.3 F (36.8 C) (12/10 0535) Temp src: Oral (12/10 0535) BP: 142/87 mmHg (12/10 0535) Pulse Rate: 90  (12/10 0535)  Labs:  Basename 11/11/11 0613 11/10/11 1020 11/10/11 0200 11/10/11 0154 11/09/11 1833 11/09/11 0900  HGB 11.9* -- 12.5 -- -- --  HCT 38.4 -- 37.3 -- -- 40.8  PLT 165 -- 181 -- -- 186  APTT -- -- 35 -- -- --  LABPROT 21.1* -- 22.7* -- -- 24.6*  INR 1.79* -- 1.96* -- -- 2.18*  HEPARINUNFRC -- -- -- -- -- --  CREATININE 0.65 -- 0.60 -- -- 0.54  CKTOTAL -- 45 -- 35 35 --  CKMB -- 3.4 -- 2.7 2.9 --  TROPONINI -- <0.30 -- <0.30 <0.30 --   Estimated Creatinine Clearance: 46.9 ml/min (by C-G formula based on Cr of 0.65).   Medications:  Scheduled:    . aspirin  324 mg Oral Once  . timolol  1 drop Both Eyes BID   And  . dorzolamide  1 drop Both Eyes BID  . furosemide  20 mg Intravenous Once  . furosemide  40 mg Intravenous Daily  . isosorbide mononitrate  30 mg Oral Daily  . levothyroxine  100 mcg Oral QAC breakfast  . metFORMIN  500 mg Oral BID WC  . metoprolol succinate  75 mg Oral Daily  . pantoprazole  40 mg Oral Q1200  . PARoxetine  12.5 mg Oral Daily  . potassium chloride  20 mEq Oral TID  . simvastatin  40 mg Oral QHS  . sodium chloride  3 mL Intravenous Q12H  . Travoprost (BAK Free)  1 drop Left Eye QHS  . warfarin  4.5 mg Oral ONCE-1800  . warfarin  5 mg Oral ONCE-1800  . DISCONTD: diltiazem  240 mg Oral Daily  .  DISCONTD: levothyroxine  125 mcg Oral Daily    Assessment: 89 yof on chronic coumadin for afib that is rate controlled on bb. INR below goal this am at 1.7, no complications have been noted. Will continue with a little extra coumadin this am.  Goal of Therapy:  INR 2-3   Plan:  Coumadin 5mg  today INR in am  Severiano Gilbert 11/11/2011,9:56 AM

## 2011-11-11 NOTE — Progress Notes (Signed)
Echocardiogram 2D Echocardiogram has been performed.  Jeryl Columbia R 11/11/2011, 11:15 AM

## 2011-11-11 NOTE — Progress Notes (Addendum)
Kathleen Mccarty [id number] is a 77 y.o. female,  Outpatient Primary MD for the patient is Loreen Freud, DO, DO  Chief Complaint  Patient presents with  . Shortness of Breath        Subjective:   Kathleen Mccarty today has, No headache, No chest pain, No abdominal pain - No Nausea, No new weakness tingling or numbness, No Cough - SOB.    Objective:   Filed Vitals:   11/10/11 0457 11/10/11 1558 11/10/11 2040 11/11/11 0535  BP: 106/67 116/71 119/73 142/87  Pulse: 84 77 78 90  Temp: 97.5 F (36.4 C) 97.6 F (36.4 C) 98.4 F (36.9 C) 98.3 F (36.8 C)  TempSrc: Oral Oral Oral Oral  Resp: 18 19 18 18   Height:      Weight: 83.8 kg (184 lb 11.9 oz)   83.6 kg (184 lb 4.9 oz)  SpO2: 94% 92% 91% 92%    Wt Readings from Last 3 Encounters:  11/11/11 83.6 kg (184 lb 4.9 oz)  07/20/07 82.668 kg (182 lb 4 oz)  05/21/07 82.668 kg (182 lb 4 oz)     Intake/Output Summary (Last 24 hours) at 11/11/11 0958 Last data filed at 11/11/11 0834  Gross per 24 hour  Intake    240 ml  Output   2202 ml  Net  -1962 ml    Exam Awake Alert, Oriented *3, No new F.N deficits, Normal affect [place].AT,PERRAL Supple Neck,No JVD, No cervical lymphadenopathy appriciated.  Symmetrical Chest wall movement, Good air movement bilaterally, Few Rales RRR,No Gallops,Rubs or new Murmurs, No Parasternal Heave +ve B.Sounds, Abd Soft, Non tender, No organomegaly appriciated, No rebound -guarding or rigidity. No Cyanosis, Clubbing , trace edema, No new Rash or bruise   Data Review  CBC  Lab 11/11/11 0613 11/10/11 0200 11/09/11 0900  WBC 8.1 11.0* 9.7  HGB 11.9* 12.5 14.0  HCT 38.4 37.3 40.8  PLT 165 181 186  MCV 93.0 91.0 91.7  MCH 28.8 30.5 31.5  MCHC 31.0 33.5 34.3  RDW 13.2 13.0 13.1  LYMPHSABS -- -- --  MONOABS -- -- --  EOSABS -- -- --  BASOSABS -- -- --  BANDABS -- -- --    Chemistries   Lab 11/11/11 0613 11/10/11 0200 11/09/11 1833 11/09/11 0900  NA 142 144 -- 140  K 3.1*  4.0 -- 3.7  CL 100 104 -- 105  CO2 31 28 -- 24  GLUCOSE 136* 204* -- 209*  BUN 22 17 -- 18  CREATININE 0.65 0.60 -- 0.54  CALCIUM 9.0 9.2 -- 9.3  MG -- 2.2 1.9 --  AST -- 14 -- --  ALT -- 12 -- --  ALKPHOS -- 64 -- --  BILITOT -- 0.5 -- --   ------------------------------------------------------------------------------------------------------------------ estimated creatinine clearance is 46.9 ml/min (by C-G formula based on Cr of 0.65). ------------------------------------------------------------------------------------------------------------------   Basename 11/10/11 0200  CHOL 144  HDL 67  LDLCALC 58  TRIG 95  CHOLHDL 2.1  LDLDIRECT --   ------------------------------------------------------------------------------------------------------------------  Basename 11/09/11 1833  TSH 0.271*  T4TOTAL --  T3FREE --  THYROIDAB --   ------------------------------------------------------------------------------------------------------------------ No results found for this basename: VITAMINB12:2,FOLATE:2,FERRITIN:2,TIBC:2,IRON:2,RETICCTPCT:2 in the last 72 hours  Coagulation profile  Lab 11/11/11 0613 11/10/11 0200 11/09/11 0900  INR 1.79* 1.96* 2.18*  PROTIME -- -- --    No results found for this basename: DDIMER:2 in the last 72 hours  Cardiac Enzymes  Lab 11/10/11 1020 11/10/11 0154 11/09/11 1833  CKMB 3.4 2.7 2.9  TROPONINI <0.30 <0.30 <0.30  MYOGLOBIN -- -- --   ------------------------------------------------------------------------------------------------------------------  Lab 11/10/11 0200 11/09/11 0900  POCBNP 3991.0* 2127.0*     Micro Results Recent Results (from the past 240 hour(s))  URINE CULTURE     Status: Normal   Collection Time   11/09/11  9:09 AM      Component Value Range Status Comment   Specimen Description URINE, CLEAN CATCH   Final    Special Requests NONE   Final    Setup Time [id number]   Final    Colony Count 85,000 COLONIES/ML    Final    Culture     Final    Value: Multiple bacterial morphotypes present, none predominant. Suggest appropriate recollection if clinically indicated.   Report Status 11/10/2011 FINAL   Final     Radiology Reports X-ray Chest Pa And Lateral   11/10/2011  *RADIOLOGY REPORT*  Clinical Data: Follow up pulmonary edema  CHEST - 2 VIEW  Comparison: 11/09/2011  Findings: Mild chronic interstitial markings.  No frank interstitial edema.  No pleural effusion or pneumothorax.  Stable cardiomegaly.  Loop recorder.  IMPRESSION: Stable cardiomegaly with chronic interstitial markings.  No frank interstitial edema.  Original Report Authenticated By: Charline Bills, M.D.   Chest Portable 1 View  11/09/2011  *RADIOLOGY REPORT*  Clinical Data: Short of breath.  Chest pain.  Atrial fibrillation. Mitral valve prolapse.  PORTABLE CHEST - 1 VIEW  Comparison: 09/15/2011.  Findings: Cardiomegaly is present.  Event recorder is present in the left chest.  Aortic arch atherosclerosis.  Pulmonary vascular congestion and interstitial pulmonary edema.  There may be underlying interstitial lung disease.  The mild airspace disease is present at the right lung base and probably in the retrocardiac region suggesting alveolar edema.  IMPRESSION:  1.  Constellation of findings compatible with mild CHF. 2.  Question underlying interstitial lung disease.  Original Report Authenticated By: Andreas Newport, M.D.    Scheduled Meds:    . aspirin  324 mg Oral Once  . timolol  1 drop Both Eyes BID   And  . dorzolamide  1 drop Both Eyes BID  . furosemide  20 mg Intravenous Once  . furosemide  40 mg Intravenous Daily  . furosemide  40 mg Oral Daily  . isosorbide mononitrate  30 mg Oral Daily  . levothyroxine  100 mcg Oral QAC breakfast  . metFORMIN  500 mg Oral BID WC  . metoprolol succinate  75 mg Oral Daily  . pantoprazole  40 mg Oral Q1200  . PARoxetine  12.5 mg Oral Daily  . potassium chloride  20 mEq Oral TID  .  simvastatin  40 mg Oral QHS  . sodium chloride  3 mL Intravenous Q12H  . Travoprost (BAK Free)  1 drop Left Eye QHS  . warfarin  4.5 mg Oral ONCE-1800  . warfarin  5 mg Oral ONCE-1800  . DISCONTD: diltiazem  240 mg Oral Daily  . DISCONTD: levothyroxine  125 mcg Oral Daily   Continuous Infusions:  PRN Meds:.sodium chloride, acetaminophen, acetaminophen, alum & mag hydroxide-simeth, levalbuterol, ondansetron (ZOFRAN) IV, ondansetron, sodium chloride, zolpidem, DISCONTD: albuterol  Assessment & Plan   Principal Problem:  1. Flash pulmonary edema with Afib RVR in the setting ++NA intake, ? Due to Excess Synthroid induced as TSH was low - ruled out for MI, feels much better, pain and SOB free, add Imdur, cardiac die t- Fluid restriction, Lasix now PO dose, o2, Nebs, HOB elevated, B Blocker increased, DC Cardizem in the setting of  CHF, Echo pending will expect some Diast. dysfunction atleast, D/W Dr Daleen Squibb cards 11-10-11  nothing further to add.   2. A-fib - in rate control now, increased B blcoker, on coumadin, Inr daily.  Lab Results  Component Value Date   INR 1.79* 11/11/2011   INR 1.96* 11/10/2011   INR 2.18* 11/09/2011    2. DM type 2 (diabetes mellitus, type 2) - sugars>200, check A1c, added glucophage. Glucose control much better.  Lab Results  Component Value Date   HGBA1C 6.3* 11/10/2011     3. CAD (coronary artery disease) - pain free, on ASA, B blcoker, Imdur. Ruled out.  4. HTN (hypertension) - good control on B Blocker  5. Hypothyroidism - TSH low, synthroid reduced.   DVT Prophylaxis  Coumadin  See all Orders from today for further details     Leroy Sea M.D 11/11/2011, 9:58 AM  [facility] Office  [phone]

## 2011-11-11 NOTE — Progress Notes (Signed)
CARE MANAGEMENT NOTE 11/11/2011  Patient:  Kathleen Mccarty, Kathleen Mccarty   Account Number:  [id number]  Date Initiated:  11/11/2011  Documentation initiated by:  Junius Creamer  Subjective/Objective Assessment:   adm w flash pul edema     Action/Plan:   lives alone, pcp dr Lorin Picket harper [phone], cardiol dr Marcelline Mates [phone]   Anticipated DC Date:  11/12/2011   Anticipated DC Plan:  HOME W HOME HEALTH SERVICES      DC Planning Services  CM consult      [facility] Choice  HOME HEALTH   Choice offered to / List presented to:  C-4 Adult Children        HH arranged  HH-1 RN  HH-10 DISEASE MANAGEMENT  HH-2 PT      [facility] agency  [facility]   Status of service:   Medicare Important Message given?   (If response is "NO", the following Medicare IM given date fields will be blank) Date Medicare IM given:   Date Additional Medicare IM given:    Discharge Disposition:  HOME W HOME HEALTH SERVICES  Per UR Regulation:  Reviewed for med. necessity/level of care/duration of stay  Comments:  12/10 spoke w pt and da, gave them hhc agency list, dr Clearance Coots was arranging hhpt for hip, spoke w dr Verdell Carmine office and they are going to use [facility] for hhc, ref to [place] [phone] for hhrn and pt, copy of hhc agency list left on chart also, ref in tlc, debbie Leonardo Makris rn,bsn [phone] HOME HEALTH AGENCIES SERVING [facility]   Agencies that are Medicare-Certified and are affiliated with [facility]  Telephone Number Address  [facility]   [facility] has ownership interest in this company; however, you are under no obligation to use this agency. [phone] or  [phone] [address] [place], [place] [address]   Agencies that are Medicare-Certified and are not affiliated with [facility] Telephone Number  Address  [facility] [phone] Fax [phone] [address], [address] [place], [place]  [address]  [facility] [phone] or [phone] Fax [phone] [address] [place], [place] [address]  [facility] [phone] Fax [phone] [address] [place], [place] [address]  [facility] [phone] Fax [phone] [address] [place], [place]  [address]  [facility] [phone] Fax [phone] [address] [place], [place] [address]  [facility] [phone] [address] [place], [place] [address]  [facility] [phone]  [address] [place], [place] [address]  [facility] [phone] or [phone] Fax [phone] [address] [place], [place]  [address]  [facility] [phone] Fax [phone] [address] [place], [place]  [address]  [facility]  [phone] Fax [phone] [address] [place], [place] [address]

## 2011-11-12 DIAGNOSIS — I4891 Unspecified atrial fibrillation: Secondary | ICD-10-CM

## 2011-11-12 LAB — MAGNESIUM: Magnesium: 2.4 mg/dL (ref 1.5–2.5)

## 2011-11-12 LAB — CBC
HCT: 41.8 % (ref 36.0–46.0)
Hemoglobin: 13.7 g/dL (ref 12.0–15.0)
MCH: 30 pg (ref 26.0–34.0)
MCHC: 32.8 g/dL (ref 30.0–36.0)
MCV: 91.7 fL (ref 78.0–100.0)
Platelets: 183 10*3/uL (ref 150–400)
RBC: 4.56 MIL/uL (ref 3.87–5.11)
RDW: 12.9 % (ref 11.5–15.5)
WBC: 8.7 10*3/uL (ref 4.0–10.5)

## 2011-11-12 LAB — PROTIME-INR
INR: 2.02 — ABNORMAL HIGH (ref 0.00–1.49)
Prothrombin Time: 23.2 seconds — ABNORMAL HIGH (ref 11.6–15.2)

## 2011-11-12 LAB — BASIC METABOLIC PANEL
BUN: 21 mg/dL (ref 6–23)
CO2: 28 mEq/L (ref 19–32)
Calcium: 9 mg/dL (ref 8.4–10.5)
Chloride: 101 mEq/L (ref 96–112)
Creatinine, Ser: 0.63 mg/dL (ref 0.50–1.10)
GFR calc Af Amer: 88 mL/min — ABNORMAL LOW (ref >90)
GFR calc non Af Amer: 76 mL/min — ABNORMAL LOW (ref >90)
Glucose, Bld: 124 mg/dL — ABNORMAL HIGH (ref 70–99)
Potassium: 3.6 mEq/L (ref 3.5–5.1)
Sodium: 141 mEq/L (ref 135–145)

## 2011-11-12 MED ORDER — LORAZEPAM 2 MG/ML IJ SOLN
INTRAMUSCULAR | Status: AC
  Administered 2011-11-12: 1 mg
  Filled 2011-11-12: qty 1

## 2011-11-12 MED ORDER — LEVOTHYROXINE SODIUM 100 MCG PO TABS: 100.0000 ug | ORAL_TABLET | Freq: Every day | ORAL | Status: DC

## 2011-11-12 MED ORDER — METFORMIN HCL 500 MG PO TABS: 500.0000 mg | ORAL_TABLET | Freq: Two times a day (BID) | ORAL | Status: DC

## 2011-11-12 MED ORDER — METOPROLOL SUCCINATE ER 50 MG PO TB24: 75.0000 mg | ORAL_TABLET | Freq: Every day | ORAL | Status: DC

## 2011-11-12 NOTE — Progress Notes (Signed)
Pt's tele d/c, IVs d/c; discharge instruction reviewed and discussed with patient and daughter, both parties verbalizes understanding of discharge information. _______________________________________________________D. Manson Passey RN

## 2011-11-12 NOTE — Discharge Summary (Signed)
Kathleen Mccarty, 77 y.o., DOB 1918/04/30, MRN [id number]. Admission date: 11/09/2011 Discharge Date 11/12/2011 Primary MD Loreen Freud, DO, DO Admitting Physician Hartley Barefoot, MD  Admission Diagnosis  Congestive heart failure [428.0] Atrial fibrillation with rapid ventricular response [427.31] CHF  Discharge Diagnosis   Principal Problem:  *Flash pulmonary edema Active Problems:  HYPERLIPIDEMIA NEC/NOS  A-fib  DM type 2 (diabetes mellitus, type 2)  CAD (coronary artery disease)  HTN (hypertension)  Hypothyroidism       Past Medical History  Diagnosis Date  . Dysrhythmia   . Cancer   . Arthritis   . History of thyroidectomy   . Hypertension   . Hyperlipidemia   . Atrial fibrillation     on coumadin    Past Surgical History  Procedure Date  . Breast surgery   . Abdominal hysterectomy     Consults  Cardiology, PT  Significant Tests:  See full reports for all details     X-ray Chest Pa And Lateral   11/10/2011  *RADIOLOGY REPORT*  Clinical Data: Follow up pulmonary edema  CHEST - 2 VIEW  Comparison: 11/09/2011  Findings: Mild chronic interstitial markings.  No frank interstitial edema.  No pleural effusion or pneumothorax.  Stable cardiomegaly.  Loop recorder.  IMPRESSION: Stable cardiomegaly with chronic interstitial markings.  No frank interstitial edema.  Original Report Authenticated By: Charline Bills, M.D.   Chest Portable 1 View  11/09/2011  *RADIOLOGY REPORT*  Clinical Data: Short of breath.  Chest pain.  Atrial fibrillation. Mitral valve prolapse.  PORTABLE CHEST - 1 VIEW  Comparison: 09/15/2011.  Findings: Cardiomegaly is present.  Event recorder is present in the left chest.  Aortic arch atherosclerosis.  Pulmonary vascular congestion and interstitial pulmonary edema.  There may be underlying interstitial lung disease.  The mild airspace disease is present at the right lung base and probably in the retrocardiac region suggesting alveolar edema.   IMPRESSION:  1.  Constellation of findings compatible with mild CHF. 2.  Question underlying interstitial lung disease.  Original Report Authenticated By: Andreas Newport, M.D.   Echo - Left ventricle: The cavity size was normal. There was mild concentric hypertrophy. The estimated ejection fraction was 55%. Wall motion was normal; there were no regional wall motion abnormalities. Left ventricular diastolic function parameters were normal. - Ventricular septum: Septal motion showed paradox. - Aortic valve: Moderate diffuse thickening and calcification. There was very mild stenosis. Valve area: 1.11cm^2(VTI). Valve area: 1.07cm^2 (Vmax). - Mitral valve: Moderate regurgitation directed eccentrically, toward the septum, and along the left atrial wall. - Left atrium: The atrium was moderately to severely dilated.   Hospital Course See H&P, Labs, Consult and Test reports for all details in brief, patient was admitted for SOB due to Flash Pulm Edema likely due to Iatrogenic Hyperthyroidism due to excessive synthroid TSH was 0 .271, causing Afib-RVR and Acute on Chr Diastolic CHF.   She was given Lasix, B blocker was increased all with excellent results, now in Rate Control, no SOB, rales free, seen and cleared for Dc by Cardiology, Pt to follow with PCP and Cardiologist in 1 week. Her A1c was 6.3, diet adjusted.  Her Synthroid was reduced, she had mild delirium last night resolved with Haldol.  She is going with daughter to stay with her for now, home Health ad PT will follow.   Today   Subjective:   [name] today has no headache,no chest abdominal pain,no new weakness tingling or numbness, feels much better wants to go home  today.    Objective:   Blood pressure 121/77, pulse 80, temperature 97.5 F (36.4 C), temperature source Oral, resp. rate 22, height 5\' 3"  (1.6 m), weight 83.6 kg (184 lb 4.9 oz), SpO2 96.00%.  Intake/Output Summary (Last 24 hours) at 11/12/11 1111 Last data  filed at 11/12/11 0941  Gross per 24 hour  Intake   1080 ml  Output   2401 ml  Net  -1321 ml    Exam Awake Alert, Oriented *3, No new F.N deficits, Normal affect [place].AT,PERRAL Supple Neck,No JVD, No cervical lymphadenopathy appriciated.  Symmetrical Chest wall movement, Good air movement bilaterally, CTAB RRR,No Gallops,Rubs or new Murmurs, No Parasternal Heave +ve B.Sounds, Abd Soft, Non tender, No organomegaly appriciated, No rebound -guarding or rigidity. No Cyanosis, Clubbing or edema, No new Rash or bruise  Data Review    CBC w Diff: Lab Results  Component Value Date   WBC 8.7 11/12/2011   HGB 13.7 11/12/2011   HCT 41.8 11/12/2011   PLT 183 11/12/2011   LYMPHOPCT 21 09/15/2011   MONOPCT 8 09/15/2011   EOSPCT 2 09/15/2011   BASOPCT 1 09/15/2011   CMP: Lab Results  Component Value Date   NA 141 11/12/2011   K 3.6 11/12/2011   CL 101 11/12/2011   CO2 28 11/12/2011   BUN 21 11/12/2011   CREATININE 0.63 11/12/2011   PROT 6.7 11/10/2011   ALBUMIN 3.5 11/10/2011   BILITOT 0.5 11/10/2011   ALKPHOS 64 11/10/2011   AST 14 11/10/2011   ALT 12 11/10/2011   Lab Results  Component Value Date   HGBA1C 6.3* 11/10/2011   Lab Results  Component Value Date   TSH 0.271* 11/09/2011     Discharge Instructions     [facility] [phone] rn and physical therapy  Follow with Primary MD Loreen Freud, DO, DO or Dr Johna Roles [phone] in 3 days and your Heart Doctor Dr Marcelline Mates cardiologist [phone] in 1 week.  Get CBC, CMP, INR checked 3 days by Primary MD and again as instructed by your Primary MD. Get a 2 view Chest X ray done next visit. TSH and Free T4 check by PCP in 2 weeks.  Get Medicines reviewed and adjusted.  Please request your Prim.MD to go over all Hospital Tests and Procedure/Radiological results at the follow up, please get all Hospital records sent to your Prim MD by signing hospital release before you go home.  Follow-up Information     Follow up with Loreen Freud, DO .         Activity: Fall precautions use walker/cane & assistance as needed  Diet: Heart Healthy,  Fluid restriction 1.8 lit/day, Aspiration precautions.  For Heart failure patients - Check your Weight same time everyday, if you gain over 2 pounds, or you develop in leg swelling, experience more shortness of breath or chest pain, call your Primary MD immediately. Follow Cardiac Low Salt Diet and 1.8 lit/day fluid restriction.  Disposition Home  If you experience worsening of your admission symptoms, develop shortness of breath, life threatening emergency, suicidal or homicidal thoughts you must seek medical attention immediately by calling 911 or calling your MD immediately  if symptoms less severe.  You Must read complete instructions/literature along with all the possible adverse reactions/side effects for all the Medicines you take and that have been prescribed to you. Take any new Medicines after you have completely understood and accpet all the possible adverse reactions/side effects.   Do not drive if your were  admitted for syncope or siezures until you have seen by Primary MD or a Neurologist and advised to drive.  Atrial Fibrillation Your caregiver has diagnosed you with atrial fibrillation (AFib). The heart normally beats very regularly; AFib is a type of irregular heartbeat. The heart rate may be faster or slower than normal. This can prevent your heart from pumping as well as it should. AFib can be constant (chronic) or intermittent (paroxysmal). CAUSES  Atrial fibrillation may be caused by:  Heart disease, including heart attack, coronary artery disease, heart failure, diseases of the heart valves, and others.   Blood clot in the lungs (pulmonary embolism).   Pneumonia or other infections.   Chronic lung disease.   Thyroid disease.   Toxins. These include alcohol, some medications (such as decongestant medications or diet pills), and  caffeine.  In some people, no cause for AFib can be found. This is referred to as Lone Atrial Fibrillation. SYMPTOMS   Palpitations or a fluttering in your chest.   A vague sense of chest discomfort.   Shortness of breath.   Sudden onset of lightheadedness or weakness.  Sometimes, the first sign of AFib can be a complication of the condition. This could be a stroke or heart failure. DIAGNOSIS  Your description of your condition may make your caregiver suspicious of atrial fibrillation. Your caregiver will examine your pulse to determine if fibrillation is present. An EKG (electrocardiogram) will confirm the diagnosis. Further testing may help determine what caused you to have atrial fibrillation. This may include chest x-ray, echocardiogram, blood tests, or CT scans. PREVENTION  If you have previously had atrial fibrillation, your caregiver may advise you to avoid substances known to cause the condition (such as stimulant medications, and possibly caffeine or alcohol). You may be advised to use medications to prevent recurrence. Proper treatment of any underlying condition is important to help prevent recurrence. PROGNOSIS  Atrial fibrillation does tend to become a chronic condition over time. It can cause significant complications (see below). Atrial fibrillation is not usually immediately life-threatening, but it can shorten your life expectancy. This seems to be worse in women. If you have lone atrial fibrillation and are under 58 years old, the risk of complications is very low, and life expectancy is not shortened. RISKS AND COMPLICATIONS  Complications of atrial fibrillation can include stroke, chest pain, and heart failure. Your caregiver will recommend treatments for the atrial fibrillation, as well as for any underlying conditions, to help minimize risk of complications. TREATMENT  Treatment for AFib is divided into several categories:  Treatment of any underlying condition.    Converting you out of AFib into a regular (sinus) rhythm.   Controlling rapid heart rate.   Prevention of blood clots and stroke.  Medications and procedures are available to convert your atrial fibrillation to sinus rhythm. However, recent studies have shown that this may not offer you any advantage, and cardiac experts are continuing research and debate on this topic. More important is controlling your rapid heartbeat. The rapid heartbeat causes more symptoms, and places strain on your heart. Your caregiver will advise you on the use of medications that can control your heart rate. Atrial fibrillation is a strong stroke risk. You can lessen this risk by taking blood thinning medications such as Coumadin (warfarin), or sometimes aspirin. These medications need close monitoring by your caregiver. Over-medication can cause bleeding. Too little medication may not protect against stroke. HOME CARE INSTRUCTIONS   If your caregiver prescribed medicine to make  your heartbeat more normally, take as directed.   If blood thinners were prescribed by your caregiver, take EXACTLY as directed.   Perform blood tests EXACTLY as directed.   Quit smoking. Smoking increases your cardiac and lung (pulmonary) risks.   DO NOT drink alcohol.   DO NOT drink caffeinated drinks (e.g. coffee, soda, chocolate, and leaf teas). You may drink decaffeinated coffee, soda or tea.   If you are overweight, you should choose a reduced calorie diet to lose weight. Please see a registered dietitian if you need more information about healthy weight loss. DO NOT USE DIET PILLS as they may aggravate heart problems.   If you have other heart problems that are causing AFib, you may need to eat a low salt, fat, and cholesterol diet. Your caregiver will tell you if this is necessary.   Exercise every day to improve your physical fitness. Stay active unless advised otherwise.   If your caregiver has given you a follow-up  appointment, it is very important to keep that appointment. Not keeping the appointment could result in heart failure or stroke. If there is any problem keeping the appointment, you must call back to this facility for assistance.  SEEK MEDICAL CARE IF:  You notice a change in the rate, rhythm or strength of your heartbeat.   You develop an infection or any other change in your overall health status.  SEEK IMMEDIATE MEDICAL CARE IF:   You develop chest pain, abdominal pain, sweating, weakness or feel sick to your stomach (nausea).   You develop shortness of breath.   You develop swollen feet and ankles.   You develop dizziness, numbness, or weakness of your face or limbs, or any change in vision or speech.  MAKE SURE YOU:   Understand these instructions.   Will watch your condition.   Will get help right away if you are not doing well or get worse.  Document Released: 11/18/2005 Document Revised: 07/31/2011 Document Reviewed: 06/22/2008 [facility] Patient Information 2012 [place], [place].  Heart Failure Heart failure (HF) is a condition in which the heart has trouble pumping blood. This means your heart does not pump blood efficiently for your body to work well. In some cases of HF, fluid may back up into your lungs or you may have swelling (edema) in your lower legs. HF is a long-term (chronic) condition. It is important for you to take good care of yourself and follow your caregiver's treatment plan. CAUSES   Health conditions:   High blood pressure (hypertension) causes the heart muscle to work harder than normal. When pressure in the blood vessels is high, the heart needs to pump (contract) with more force in order to circulate blood throughout the body. High blood pressure eventually causes the heart to become stiff and weak.   Coronary artery disease (CAD) is the buildup of cholesterol and fat (plaques) in the arteries of the heart. The blockage in the arteries deprives the heart  muscle of oxygen and blood. This can cause chest pain and may lead to a heart attack. High blood pressure can also contribute to CAD.   Heart attack (myocardial infarction) occurs when 1 or more arteries in the heart become blocked. The loss of oxygen damages the muscle tissue of the heart. When this happens, part of the heart muscle dies. The injured tissue does not contract as well and weakens the heart's ability to pump blood.   Abnormal heart valves can cause HF when the heart valves do not  open and close properly. This makes the heart muscle pump harder to keep the blood flowing.   Heart muscle disease (cardiomyopathy or myocarditis) is damage to the heart muscle from a variety of causes. These can include drug or alcohol abuse, infections, or unknown reasons. These can increase the risk of HF.   Lung disease makes the heart work harder because the lungs do not work properly. This can cause a strain on the heart leading it to fail.   Diabetes increases the risk of HF. High blood sugar contributes to high fat (lipid) levels in the blood. Diabetes can also cause slow damage to tiny blood vessels that carry important nutrients to the heart muscle. When the heart does not get enough oxygen and food, it can cause the heart to become weak and stiff. This leads to a heart that does not contract efficiently.   Other diseases can contribute to HF. These include abnormal heart rhythms, thyroid problems, and low blood counts (anemia).   Unhealthy lifestyle habits:   Obesity.   Smoking.   Eating foods high in fat and cholesterol.   Eating or drinking beverages high in salt.   Drug or alcohol abuse.   Lack of exercise.  SYMPTOMS  HF symptoms may vary and can be hard to detect. Symptoms may include:  Shortness of breath with activity, such as climbing stairs.   Persistent cough.   Swelling of the feet, ankles, legs, or abdomen.   Unexplained weight gain.   Difficulty breathing when lying  flat.   Waking from sleep because of the need to sit up and get more air.   Rapid heartbeat.   Fatigue and loss of energy.   Feeling lightheaded or close to fainting.  DIAGNOSIS  A diagnosis of HF is based on your history, symptoms, physical examination, and diagnostic tests. Diagnostic tests for HF may include:  EKG.   Chest X-ray.   Blood tests.   Exercise stress test.   Blood oxygen test (arterial blood gas).   Evaluation by a heart doctor (cardiologist).   Ultrasound evaluation of the heart (echocardiogram).   Heart artery test to look for blockages (angiogram).   Radioactive imaging to look at the heart (radionuclide test).  TREATMENT  Treatment is aimed at managing the symptoms of HF. Medicines, lifestyle changes, or surgical intervention may be necessary to treat HF.  Medicines to help treat HF may include:   Angiotensin-converting enzyme (ACE) inhibitors. These block the effects of a blood protein called angiotensin-converting enzyme. ACE inhibitors relax (dilate) the blood vessels and help lower blood pressure. This decreases the workload of the heart, slows the progression of HF, and improves symptoms.   Angiotensin receptor blockers (ARBs). These medications work similar to ACE inhibitors. ARBs may be an alternative for people who cannot tolerate an ACE inhibitor.   Aldosterone antagonists. This medication helps get rid of extra fluid from your body. This lowers the volume of blood the heart has to pump.   Water pills (diuretics). Diuretics cause the kidneys to remove salt and water from the blood. The extra fluid is removed by urination. By removing extra fluid from the body, diuretics help lower the workload of the heart and help prevent fluid buildup in the lungs so breathing is easier.   Beta blockers. These prevent the heart from beating too fast and improve heart muscle strength. Beta blockers help maintain a normal heart rate, control blood pressure, and  improve HF symptoms.   Digitalis. This increases the force  of the heartbeat and may be helpful to people with HF or heart rhythm problems.   Healthy lifestyle changes include:   Stopping smoking.   Eating a healthy diet. Avoid foods high in fat. Avoid foods fried in oil or made with fat. A dietician can help with healthy food choices.   Limiting how much salt you eat.   Limiting alcohol intake to no more than 1 drink per day for women and 2 drinks per day for men. Drinking more than that is harmful to your heart. If your heart has already been damaged by alcohol or you have severe HF, drinking alcohol should be stopped completely.   Exercising as directed by your caregiver.   Surgical treatment for HF may include:   Procedures to open blocked arteries, repair damaged heart valves, or remove damaged heart muscle tissue.   A pacemaker to help heart muscle function and to control certain abnormal heart rhythms.   A defibrillator to possibly prevent sudden cardiac death.  HOME CARE INSTRUCTIONS   Activity level. Your caregiver can help you determine what type of exercise program may be helpful. It is important to maintain your strength. Pace your physical activity to avoid shortness of breath or chest pain. Rest for 1 hour before and after meals. A cardiac rehabilitation program may be helpful to some people with HF.   Diet. Eat a heart healthy diet. Food choices should be low in saturated fat and cholesterol. Talk to a dietician to learn about heart healthy foods.   Salt intake. When you have HF, you need to limit the amount of salt you eat. Eat less than 1500 milligrams (mg) of salt per day or as recommended by your caregiver.   Weight monitoring. Weigh yourself every day. You should weigh yourself in the morning after you urinate and before you eat breakfast. Wear the same amount of clothing each time you weigh yourself. Record your weight daily. Bring your recorded weights to your  clinic visits. Tell your caregiver right away if you have gained 3 lb/1.4 kg in 1 day, or 5 lb/2.3 kg in a week or whatever amount you were told to report.   Blood pressure monitoring. This should be done as directed by your caregiver. A home blood pressure cuff can be purchased at a drugstore. Record your blood pressure numbers and bring them to your clinic visits. Tell your caregiver if you become dizzy or lightheaded upon standing up.   Smoking. If you are currently a smoker, it is time to quit. Nicotine makes your heart work harder by causing your blood vessels to constrict. Do not use nicotine gum or patches before talking to your caregiver.   Follow up. Be sure to schedule a follow-up visit with your caregiver. Keep all your appointments.  SEEK MEDICAL CARE IF:   Your weight increases by 3 lb/1.4 kg in 1 day or 5 lb/2.3 kg in a week.   You notice increasing shortness of breath that is unusual for you. This may happen during rest, sleep, or with activity.   You cough more than normal, especially with physical activity.   You notice more swelling in your hands, feet, ankles, or belly (abdomen).   You are unable to sleep because it is hard to breathe.   You cough up bloody mucus (sputum).   You begin to feel "jumping" or "fluttering" sensations (palpitations) in your chest.  SEEK IMMEDIATE MEDICAL CARE IF:   You have severe chest pain or pressure which may  include symptoms such as:   Pain or pressure in the arms, neck, jaw, or back.   Feeling sweaty.   Feeling sick to your stomach (nauseous).   Feeling short of breath while at rest.   Having a fast or irregular heartbeat.   You experience stroke symptoms. These symptoms include:   Facial weakness or numbness.   Weakness or numbness in an arm, leg, or on one side of your body.   Blurred vision.   Difficulty talking or thinking.   Dizziness or fainting.   Severe headache.  THESE ARE MEDICAL EMERGENCIES. Do not wait  to see if the symptoms go away. Call your local emergency services (911 in [place] DO NOT drive yourself to the hospital. IMPORTANT  Make a list of every medicine, vitamin, or herbal supplement you are taking. Keep the list with you at all times. Show it to your caregiver at every visit. Keep the list up-to-date.   Ask your caregiver or pharmacist to write an explanation of each medicine you are taking. This should include:   Why you are taking it.   The possible side effects.   The best time of day to take it.   Foods to take with it or what foods to avoid.   When to stop taking it.  MAKE SURE YOU:   Understand these instructions.   Will watch your condition.   Will get help right away if you are not doing well or get worse.  Document Released: 11/18/2005 Document Revised: 07/31/2011 Document Reviewed: 03/02/2010 [facility] Patient Information 2012 [place], [place].     Follow-up Information    Follow up with Loreen Freud, DO. Make an appointment in 3 days.      Follow up with HARPER,SCOTT. Make an appointment in 3 days.   Contact information:   [address] [place] [address] [phone]          Discharge Medications   Medication List  As of 11/12/2011 11:11 AM   CHANGE how you take these medications         levothyroxine 100 MCG tablet   Commonly known as: SYNTHROID, LEVOTHROID   Take 1 tablet (100 mcg total) by mouth daily before breakfast.   What changed: - medication strength - dose - how often to take the med      metoprolol 50 MG 24 hr tablet   Commonly known as: TOPROL-XL   Take 1.5 tablets (75 mg total) by mouth daily. Pharmacy dispense 50mg  and 25 mg pills to make dose 75mg  daily.   What changed: - dose - doctor's instructions         CONTINUE taking these medications         acetaminophen 500 MG tablet   Commonly known as: TYLENOL      dorzolamide-timolol 22.3-6.8 MG/ML ophthalmic solution   Commonly known as: COSOPT      ICAPS AREDS  FORMULA PO      isosorbide mononitrate 60 MG 24 hr tablet   Commonly known as: IMDUR      multivitamin tablet      OVER THE COUNTER MEDICATION      PARoxetine 12.5 MG 24 hr tablet   Commonly known as: PAXIL-CR      simvastatin 40 MG tablet   Commonly known as: ZOCOR      Travoprost (BAK Free) 0.004 % Soln ophthalmic solution   Commonly known as: TRAVATAN      * warfarin 3 MG tablet   Commonly known as:  COUMADIN      * warfarin 2.5 MG tablet   Commonly known as: COUMADIN     * Notice: This list has 2 medication(s) that are the same as other medications prescribed for you. Read the directions carefully, and ask your doctor or other care provider to review them with you.        Where to get your medications    These are the prescriptions that you need to pick up.   You may get these medications from any pharmacy.         levothyroxine 100 MCG tablet   metoprolol 50 MG 24 hr tablet             Total Time in preparing paper work, data evaluation and todays exam - 35 minutes  Susa Raring K M.D 11/12/2011, 11:11 AM  [facility] Office  [phone]

## 2011-11-12 NOTE — Progress Notes (Signed)
Pt was found walking out in hallway with foley bag in hand. Confused and agitated. Attempted to return pt to room but pt was not cooperative. Pt insisted the police be called. Security was called at this time and MD was paged. Security talked with pt. Pt said there was a "party going on" in her room and "nurses were drinking and doing drugs". Dr. Betti Cruz responded to page and new orders received. Judi Cong RN

## 2011-11-12 NOTE — Progress Notes (Signed)
Physical Therapy Treatment Patient Details Name: Kathleen Mccarty MRN: [id number] DOB: 01/28/1918 Today's Date: 11/12/2011  PT Assessment/Plan  PT - Assessment/Plan Comments on Treatment Session: Pt moving well today.  Attempted to use SPC however pt more stable with RW.  Pt agrees and ordered RW for home.  CM and RN notifited of ordering RW for home.  Pt plans to d/c home today with daughter staying with pt a couples days. PT Plan: Discharge plan remains appropriate PT Frequency: Min 3X/week Follow Up Recommendations: Home health PT Equipment Recommended: Rolling walker with 5" wheels PT Goals  Acute Rehab PT Goals PT Goal Formulation: With patient Time For Goal Achievement: 7 days Pt will go Supine/Side to Sit: Independently;with HOB 0 degrees PT Goal: Supine/Side to Sit - Progress: Not met Pt will go Sit to Supine/Side: Independently;with HOB 0 degrees PT Goal: Sit to Supine/Side - Progress: Not met Pt will go Sit to Stand: with modified independence PT Goal: Sit to Stand - Progress: Progressing toward goal Pt will go Stand to Sit: with modified independence PT Goal: Stand to Sit - Progress: Progressing toward goal Pt will Ambulate: >150 feet;with modified independence;with least restrictive assistive device PT Goal: Ambulate - Progress: Progressing toward goal Pt will Go Up / Down Stairs: 3-5 stairs;with min assist;with least restrictive assistive device PT Goal: Up/Down Stairs - Progress: Met  PT Treatment Precautions/Restrictions  Precautions Precaution Comments: Patient has decreased vision (unable to see in right eye). Restrictions Weight Bearing Restrictions: No Mobility (including Balance) Bed Mobility Bed Mobility: No Transfers Transfers: Yes Sit to Stand: 5: Supervision;With upper extremity assist;From bed Sit to Stand Details (indicate cue type and reason): VCs for hand placement Stand to Sit: 5: Supervision;With upper extremity assist;To bed Stand to Sit  Details: VCs for technique Ambulation/Gait Ambulation/Gait: Yes Ambulation/Gait Assistance: 5: Supervision Ambulation/Gait Assistance Details (indicate cue type and reason): Cues to keep RW close to body Ambulation Distance (Feet): 100 Feet Assistive device: Rolling walker Gait Pattern: Step-through pattern;Decreased stride length;Trunk flexed Gait velocity: Slow gait speed Stairs: Yes Stairs Assistance: 4: Min assist Stairs Assistance Details (indicate cue type and reason): VCs for hand and LE placement Stair Management Technique: One rail Right;Step to pattern;Forwards (HHA instead of using SPC) Number of Stairs: 3  Wheelchair Mobility Wheelchair Mobility: No  Balance Balance Assessed: No Exercise    End of Session PT - End of Session Equipment Utilized During Treatment: Gait belt Activity Tolerance: Patient tolerated treatment well Patient left: in bed;with call bell in reach (Pt sitting EOB getting ready to dress with tech) Nurse Communication: Mobility status for ambulation;Other (comment) (Spoke with RN and CM of pt needing RW for d/c) General Behavior During Session: [facility] for tasks performed Cognition: [facility] for tasks performed  Bach Rocchi 11/12/2011, 1:05 PM [phone]

## 2011-11-12 NOTE — Progress Notes (Signed)
Subjective:    Kathleen Mccarty is a 77 year old female who is admitted with acute shortness of breath. She was found to have a low TSH indicating that she was over placed with her Synthroid. She had developed atrial fibrillation with a rapid ventricular response. Her transient acute diastolic heart failure was presumably caused by her atrial fibrillation with a rapid ventricular response.  Her metoprolol was increased. Her Synthroid was reduced. She's feeling quite a bit better and is no longer having symptoms of congestive heart failure.  She had some disorientation last night.      Marland Kitchen aspirin  324 mg Oral Once  . timolol  1 drop Both Eyes BID   And  . dorzolamide  1 drop Both Eyes BID  . furosemide  40 mg Oral Daily  . ICaps MV  2 tablet Oral BID  . isosorbide mononitrate  30 mg Oral Daily  . levothyroxine  100 mcg Oral QAC breakfast  . LORazepam      . metFORMIN  500 mg Oral BID WC  . metoprolol succinate  75 mg Oral Daily  . pantoprazole  40 mg Oral Q1200  . PARoxetine  12.5 mg Oral Daily  . potassium chloride  20 mEq Oral TID  . simvastatin  40 mg Oral QHS  . sodium chloride  3 mL Intravenous Q12H  . Travoprost (BAK Free)  1 drop Left Eye QHS  . warfarin  5 mg Oral ONCE-1800      Objective:  Vital Signs in the last 24 hours: Blood pressure 121/77, pulse 80, temperature 97.5 F (36.4 C), temperature source Oral, resp. rate 22, height 5\' 3"  (1.6 m), weight 184 lb 4.9 oz (83.6 kg), SpO2 96.00%. Temp:  [97.5 F (36.4 C)-98.7 F (37.1 C)] 97.5 F (36.4 C) (12/11 0726) Pulse Rate:  [72-80] 80  (12/11 0726) Resp:  [20-22] 22  (12/11 0726) BP: (112-121)/(71-79) 121/77 mmHg (12/11 0726) SpO2:  [93 %-96 %] 96 % (12/11 0726) FiO2 (%):  [2 %] 2 % (12/11 0726)  Intake/Output from previous day: 12/10 0701 - 12/11 0700 In: 1080 [P.O.:1080] Out: 1100 [Urine:1100] Intake/Output from this shift: Total I/O In: -  Out: 900 [Urine:900]  Physical Exam: The patient is alert and  oriented x 3.  The mood and affect are normal.   Skin: warm and dry.  Color is normal.    HEENT:   the sclera are nonicteric.  The mucous membranes are moist.  The carotids are 2+ without bruits.  There is no thyromegaly.  There is no JVD.    Lungs: clear.  The chest wall is non tender.    Heart: Irregularly irregular.  There is a 2/6 systolic murmur.. The PMI is not displaced.     Abdomen: good bowel sounds.  There is no guarding or rebound.  There is no hepatosplenomegaly or tenderness.  There are no masses.   Extremities:  no clubbing, cyanosis, or edema.  The legs are without rashes.  The distal pulses are intact.   Neuro:  Cranial nerves II - XII are intact.  Motor and sensory functions are intact.     Lab Results:  [facility] 11/12/11 0620 11/11/11 0613  WBC 8.7 8.1  HGB 13.7 11.9*  PLT 183 165    Basename 11/12/11 0620 11/11/11 0613  NA 141 142  K 3.6 3.1*  CL 101 100  CO2 28 31  GLUCOSE 124* 136*  BUN 21 22  CREATININE 0.63 0.65    Basename 11/10/11 1020  11/10/11 0154  TROPONINI <0.30 <0.30   No results found for this basename: BNP in the last 72 hours Hepatic Function Panel  Basename 11/10/11 0200  PROT 6.7  ALBUMIN 3.5  AST 14  ALT 12  ALKPHOS 64  BILITOT 0.5  BILIDIR --  IBILI --   Lab Results  Component Value Date   CHOL 144 11/10/2011   HDL 67 11/10/2011   LDLCALC 58 11/10/2011   TRIG 95 11/10/2011   CHOLHDL 2.1 11/10/2011    Basename 11/12/11 0620  INR 2.02*   Telemetry: Atrial fibrillation with a controlled ventricular response.    Assessment/Plan:   1. Atrial fibrillation: Her atrial fibrillation rate is much better controlled at this point. I suspect that her over  replacement with her Synthroid was a major contributor. We'll continue with her current medications. She has a cardiologist in [place] and she should followup with that cardiologist. She has a loop recorder implanted.   We will sign off. Please call us for any further  questions. She can go home when she is stable from an internal medicine standpoint. She can follow up with her cardiologist in [place].  Vesta Mixer, Montez Hageman., MD, [facility] 11/12/2011, 8:16 AM LOS: Day 3

## 2011-11-12 NOTE — Progress Notes (Signed)
   CARE MANAGEMENT NOTE 11/12/2011  Patient:  Kathleen Mccarty, Kathleen Mccarty   Account Number:  [id number]  Date Initiated:  11/11/2011  Documentation initiated by:  Junius Creamer  Subjective/Objective Assessment:   adm w flash pul edema     Action/Plan:   lives alone, pcp dr Lorin Picket harper [phone], cardiol dr Marcelline Mates [phone]   Anticipated DC Date:  11/12/2011   Anticipated DC Plan:  HOME W HOME HEALTH SERVICES      DC Planning Services  CM consult      [facility] Choice  HOME HEALTH  DURABLE MEDICAL EQUIPMENT   Choice offered to / List presented to:  C-4 Adult Children   DME arranged  Levan Hurst      DME agency  [facility]     [facility] arranged  HH-1 RN  HH-10 DISEASE MANAGEMENT  HH-2 PT      [facility] agency  [facility]   Status of service:   Medicare Important Message given?   (If response is "NO", the following Medicare IM given date fields will be blank) Date Medicare IM given:   Date Additional Medicare IM given:    Discharge Disposition:  HOME W HOME HEALTH SERVICES  Per UR Regulation:  Reviewed for med. necessity/level of care/duration of stay  Comments:  12/10 spoke w pt and da, gave them hhc agency list, dr Clearance Coots was arranging hhpt for hip, spoke w dr Verdell Carmine office and they are going to use [facility] for hhc, ref to [facility] [phone] for hhrn and pt, copy of hhc agency list left on chart also, ref in tlc, debbie Sanaz Scarlett rn,bsn [phone] 12/11 phy ther called and req rolling walker, asked md to order, asked [facility] to del walker to room, order placed in tlc also, debbie Roshon Duell rn,bsn

## 2011-11-12 NOTE — Progress Notes (Signed)
Ativan given per orders. Pt's daughter arrived. Pt is assisted back to bed with daughter's reassurance. Pt now resting quietly with daughter at bedside. Judi Cong, RN

## 2012-02-18 ENCOUNTER — Emergency Department (HOSPITAL_COMMUNITY)
Admission: EM | Admit: 2012-02-18 | Discharge: 2012-02-18 | Disposition: A | Discharge status: Home / Self Care | Payer: MEDICARE | Attending: Emergency Medicine | Admitting: Emergency Medicine

## 2012-02-18 ENCOUNTER — Other Ambulatory Visit: Payer: Self-pay

## 2012-02-18 ENCOUNTER — Emergency Department (HOSPITAL_COMMUNITY): Payer: MEDICARE

## 2012-02-18 ENCOUNTER — Encounter (HOSPITAL_COMMUNITY): Payer: Self-pay | Admitting: Emergency Medicine

## 2012-02-18 DIAGNOSIS — I4891 Unspecified atrial fibrillation: Secondary | ICD-10-CM

## 2012-02-18 DIAGNOSIS — E785 Hyperlipidemia, unspecified: Secondary | ICD-10-CM | POA: Insufficient documentation

## 2012-02-18 DIAGNOSIS — Z7901 Long term (current) use of anticoagulants: Secondary | ICD-10-CM | POA: Insufficient documentation

## 2012-02-18 DIAGNOSIS — Z79899 Other long term (current) drug therapy: Secondary | ICD-10-CM | POA: Insufficient documentation

## 2012-02-18 DIAGNOSIS — I10 Essential (primary) hypertension: Secondary | ICD-10-CM | POA: Insufficient documentation

## 2012-02-18 DIAGNOSIS — R079 Chest pain, unspecified: Secondary | ICD-10-CM | POA: Insufficient documentation

## 2012-02-18 LAB — CBC
MCV: 90 fL (ref 78.0–100.0)
Platelets: 168 10*3/uL (ref 150–400)
RDW: 12.8 % (ref 11.5–15.5)
WBC: 9.7 10*3/uL (ref 4.0–10.5)

## 2012-02-18 LAB — COMPREHENSIVE METABOLIC PANEL
Albumin: 3.5 g/dL (ref 3.5–5.2)
BUN: 20 mg/dL (ref 6–23)
CO2: 27 mEq/L (ref 19–32)
Creatinine, Ser: 0.68 mg/dL (ref 0.50–1.10)
GFR calc Af Amer: 86 mL/min — ABNORMAL LOW (ref >90)
GFR calc non Af Amer: 74 mL/min — ABNORMAL LOW (ref >90)
Potassium: 4.2 mEq/L (ref 3.5–5.1)
Sodium: 141 mEq/L (ref 135–145)
Total Bilirubin: 0.4 mg/dL (ref 0.3–1.2)

## 2012-02-18 LAB — DIFFERENTIAL
Basophils Absolute: 0 10*3/uL (ref 0.0–0.1)
Eosinophils Absolute: 0.2 10*3/uL (ref 0.0–0.7)
Eosinophils Relative: 2 % (ref 0–5)
Lymphocytes Relative: 22 % (ref 12–46)
Neutrophils Relative %: 69 % (ref 43–77)

## 2012-02-18 LAB — POCT I-STAT TROPONIN I: Troponin i, poc: 0 ng/mL (ref 0.00–0.08)

## 2012-02-18 LAB — PROTIME-INR
INR: 1.92 — ABNORMAL HIGH (ref 0.00–1.49)
Prothrombin Time: 22.3 seconds — ABNORMAL HIGH (ref 11.6–15.2)

## 2012-02-18 LAB — TROPONIN I: Troponin I: <0.3 ng/mL (ref <0.30)

## 2012-02-18 NOTE — ED Notes (Signed)
MD at bedside. 

## 2012-02-18 NOTE — ED Provider Notes (Signed)
History     CSN: [id number]  Arrival date & time 02/18/12  1349   First MD Initiated Contact with Patient 02/18/12 1905      Chief Complaint  Patient presents with  . Chest Pain    (Consider location/radiation/quality/duration/timing/severity/associated sxs/prior treatment) HPI History obtained from patient. 78 year old white female with past medical history of atrial fibrillation, hypertension, hyperlipidemia presents with chest pain which started this morning. She states that she woke up this morning and was going about her normal morning activities when she suddenly had right-sided chest pain which seem to radiate to her back. The pain was described as sharp in nature. She did not have any associated nausea, vomiting, abdominal pain, diaphoresis, dyspnea. The pain did briefly get to the point where she felt as if she was going to pass out. She took 2 digitalis tablets at home, which she stated decreased the pain (I do not see this listed on her med rec). She is currently pain-free. She has a home health nurse who comes at 1 PM every day, and when the nurse arrived, she asked her to drive her to the ED for further evaluation. She is currently CP free.  Patient states that she has had a cough for the past several days. Denies associated fever or chills.  Patient has previously been followed by [facility] cardiology for her atrial fibrillation. She states that she is now followed at [facility] for cardiology as well as primary care, and has had a cardiac device of some sort inserted.  Past Medical History  Diagnosis Date  . Dysrhythmia   . Cancer   . Arthritis   . History of thyroidectomy   . Hypertension   . Hyperlipidemia   . Atrial fibrillation     on coumadin    Past Surgical History  Procedure Date  . Breast surgery   . Abdominal hysterectomy     No family history on file.  History  Substance Use Topics  . Smoking status: Never Smoker   . Smokeless tobacco: Not on file  .  Alcohol Use: No    OB History    Grav Para Term Preterm Abortions TAB SAB Ect Mult Living                  Review of Systems  Constitutional: Negative for fever, chills, activity change and appetite change.  Respiratory: Positive for cough. Negative for chest tightness and shortness of breath.   Cardiovascular: Positive for chest pain. Negative for palpitations and leg swelling.  Gastrointestinal: Negative for nausea, vomiting and abdominal pain.  Musculoskeletal: Negative for myalgias.  Skin: Negative for color change and rash.  Neurological: Negative for dizziness, weakness and headaches.    Allergies  Amoxicillin; Cephalosporins; Codeine; Eggs or egg-derived products; Iodine; Shellfish allergy; and Adhesive  Home Medications   Current Outpatient Rx  Name Route Sig Dispense Refill  . ACETAMINOPHEN 500 MG PO TABS Oral Take 500 mg by mouth 4 (four) times daily.     . DORZOLAMIDE HCL-TIMOLOL MAL 22.3-6.8 MG/ML OP SOLN Both Eyes Place 1 drop into both eyes 2 (two) times daily.      . FUROSEMIDE 20 MG PO TABS Oral Take 20 mg by mouth daily.    . ISOSORBIDE MONONITRATE ER 60 MG PO TB24 Oral Take 60 mg by mouth daily.      Marland Kitchen LEVOTHYROXINE SODIUM 112 MCG PO TABS Oral Take 112 mcg by mouth daily.    Marland Kitchen METOPROLOL SUCCINATE ER 50 MG PO TB24  Oral Take 75 mg by mouth daily.    . ICAPS MV PO TABS Oral Take 1 tablet by mouth daily.    Marland Kitchen NITROGLYCERIN 0.4 MG SL SUBL Sublingual Place 0.4 mg under the tongue every 5 (five) minutes as needed. For chest pain    . PAROXETINE HCL ER 12.5 MG PO TB24 Oral Take 12.5 mg by mouth daily.      Marland Kitchen SIMVASTATIN 40 MG PO TABS Oral Take 40 mg by mouth at bedtime.      . TRAVOPROST (BAK FREE) 0.004 % OP SOLN Left Eye Place 1 drop into the left eye at bedtime.      . WARFARIN SODIUM 2.5 MG PO TABS Oral Take 2.5 mg by mouth 2 (two) times a week. Saturday and sunday    . WARFARIN SODIUM 3 MG PO TABS Oral Take 3 mg by mouth daily. Monday, Tuesday, Wednesday,  Thursday, friday      BP 125/72  Pulse 95  Temp(Src) 98.3 F (36.8 C) (Oral)  Resp 21  SpO2 95%  Physical Exam  Nursing note and vitals reviewed. Constitutional: She appears well-developed and well-nourished. No distress.  HENT:  Head: Normocephalic and atraumatic.  Eyes: EOM are normal. Pupils are equal, round, and reactive to light.  Neck: Normal range of motion.  Cardiovascular: Normal rate and normal heart sounds.        Irregularly irregular rhythm, systolic murmur throughout precordium  Pulmonary/Chest: Effort normal and breath sounds normal. No respiratory distress. She exhibits no tenderness.       Mild reproducible tenderness to R scapular area which is c/w previous pain  Abdominal: Soft. Bowel sounds are normal. There is no tenderness.  Musculoskeletal: She exhibits no edema.       No peripheral edema, pedal pulses intact, moves all 4 ext  Neurological: She is alert.  Skin: Skin is warm and dry. No rash noted. She is not diaphoretic.  Psychiatric: She has a normal mood and affect.    ED Course  Procedures (including critical care time)  Labs Reviewed  COMPREHENSIVE METABOLIC PANEL - Abnormal; Notable for the following:    Glucose, Bld 133 (*)    GFR calc non Af Amer 74 (*)    GFR calc Af Amer 86 (*)    All other components within normal limits  PROTIME-INR - Abnormal; Notable for the following:    Prothrombin Time 22.3 (*)    INR 1.92 (*)    All other components within normal limits  DIGOXIN LEVEL - Abnormal; Notable for the following:    Digoxin Level <0.3 (*)    All other components within normal limits  CBC  DIFFERENTIAL  TROPONIN I  TROPONIN I  POCT I-STAT TROPONIN I   Dg Chest 2 View  02/18/2012  *RADIOLOGY REPORT*  Clinical Data: Cough, right upper chest pain, history atrial fibrillation  CHEST - 2 VIEW  Comparison: 11/10/2011  Findings: Loop recorder lower left chest. Enlargement of cardiac silhouette. Calcified tortuous aorta. Emphysematous and  bronchitic changes. Minimal pulmonary vascular congestion. No definite acute infiltrate, pleural effusion, or pneumothorax. Scattered costal cartilaginous calcifications are identified. Chronic accentuation of right basilar markings appears stable since earlier study of 09/15/2011. Bones diffusely demineralized.  IMPRESSION: Emphysematous and chronic bronchitic changes with chronic accentuation of right basilar markings. No definite acute infiltrate.  Original Report Authenticated By: Lollie Marrow, M.D.     1. A-fib   2. Chest pain       MDM  MDM Number of  Diagnoses or Management Options A-fib: established, improving Chest pain: new, needed workup Diagnosis management comments: Pt presents with R sided CP which has now resolved. Has hx of afib which is again demonstrated on today's ECG; she has had afib with RVR in the past which is not present today. On exam, in no acute distress; systolic murmur which has been noted in the past, but no other acute findings. Vitals stable. CXR which I personally reviewed unremarkable for acute infiltrate. Troponin x 2 negative. INR just barely subtherapeutic. CMP, CBC nl. Based on this, don't feel that the pt requires admission at this time. Little evidence to support ACS, PNA, other acute cardiopulm findings.  Pt has appt with PCP tomorrow at [facility] which she was instructed to keep. Return precautions discussed.  D/W Dr. Ignacia Palma, who saw pt with me and discussed findings with her.    Amount and/or Complexity of Data Reviewed Clinical lab tests: ordered and reviewed Tests in the radiology section of CPT: ordered and reviewed Tests in the medicine section of CPT: ordered and reviewed Discussion of test results with the performing providers: yes Decide to obtain previous medical records or to obtain history from someone other than the patient: yes Obtain history from someone other than the patient: yes Review and summarize past medical records:  yes Discuss the patient with other providers: yes Independent visualization of images, tracings, or specimens: yes  Risk of Complications, Morbidity, and/or Mortality Presenting problems: high Diagnostic procedures: minimal  Patient Progress Patient progress: resolved    Medical screening examination/treatment/procedure(s) were conducted as a shared visit with non-physician practitioner(s) and myself.  I personally evaluated the patient during the encounter 78 yo woman had a brief episode of chest pain this morning, which has since subsided.  She has known atrial fibrillation, for which she is followed by a doctor at [facility].   Exam shows her to be a pleasant elderly woman in no distress.  Lungs clear, heart sounds normal.  Lab tests with 2 sets of troponins negative.  Reassured and released.   Osvaldo Human, M.D.     Grant Fontana, [place] 02/18/12 2252  Carleene Cooper III, MD 02/19/12 1332

## 2012-02-18 NOTE — ED Notes (Signed)
Pt brought back from triage via wheelchair with caretaker in tow; pt undressed, in gown, on monitor, continuous pulse oximetry and blood pressure cuff; warm blankets given

## 2012-02-18 NOTE — ED Notes (Signed)
Patient ate meal given to her earlier.

## 2012-02-18 NOTE — ED Notes (Signed)
fely like she was going to pass out pain has eased up now took 2 dig before she came she states

## 2012-02-18 NOTE — ED Provider Notes (Signed)
7:53 PM  Date: 02/18/2012  Rate: 92  Rhythm: atrial fibrillation  QRS Axis: normal  Intervals: normal QRS:  Low voltage in limb leads.  ST/T Wave abnormalities: nonspecific ST/T changes  Conduction Disutrbances:none  Narrative Interpretation: Abnormal EKG  Old EKG Reviewed: changes noted--EKG on 11/09/2011 showed atrial fibrillation with rate of 165.    Carleene Cooper III, MD 02/18/12 [phone]

## 2012-02-18 NOTE — ED Notes (Signed)
Dr. Ignacia Palma in room to discharge patient.

## 2012-02-18 NOTE — Discharge Instructions (Signed)
1) Your labs, EKG, and chest xray here did not show signs of a heart problem today. Your chest xray did not show signs of a pneumonia.  2) Please keep your appointments with your doctors at [facility] to get your INR rechecked. 3) For increased chest pain, shortness of breath, nausea/vomiting, or otherwise changing or worsening condition, return to the ED.  Atrial Fibrillation Your caregiver has diagnosed you with atrial fibrillation (AFib). The heart normally beats very regularly; AFib is a type of irregular heartbeat. The heart rate may be faster or slower than normal. This can prevent your heart from pumping as well as it should. AFib can be constant (chronic) or intermittent (paroxysmal). CAUSES  Atrial fibrillation may be caused by:  Heart disease, including heart attack, coronary artery disease, heart failure, diseases of the heart valves, and others.   Blood clot in the lungs (pulmonary embolism).   Pneumonia or other infections.   Chronic lung disease.   Thyroid disease.   Toxins. These include alcohol, some medications (such as decongestant medications or diet pills), and caffeine.  In some people, no cause for AFib can be found. This is referred to as Lone Atrial Fibrillation. SYMPTOMS   Palpitations or a fluttering in your chest.   A vague sense of chest discomfort.   Shortness of breath.   Sudden onset of lightheadedness or weakness.  Sometimes, the first sign of AFib can be a complication of the condition. This could be a stroke or heart failure. DIAGNOSIS  Your description of your condition may make your caregiver suspicious of atrial fibrillation. Your caregiver will examine your pulse to determine if fibrillation is present. An EKG (electrocardiogram) will confirm the diagnosis. Further testing may help determine what caused you to have atrial fibrillation. This may include chest x-ray, echocardiogram, blood tests, or CT scans. PREVENTION  If you have previously had  atrial fibrillation, your caregiver may advise you to avoid substances known to cause the condition (such as stimulant medications, and possibly caffeine or alcohol). You may be advised to use medications to prevent recurrence. Proper treatment of any underlying condition is important to help prevent recurrence. PROGNOSIS  Atrial fibrillation does tend to become a chronic condition over time. It can cause significant complications (see below). Atrial fibrillation is not usually immediately life-threatening, but it can shorten your life expectancy. This seems to be worse in women. If you have lone atrial fibrillation and are under 66 years old, the risk of complications is very low, and life expectancy is not shortened. RISKS AND COMPLICATIONS  Complications of atrial fibrillation can include stroke, chest pain, and heart failure. Your caregiver will recommend treatments for the atrial fibrillation, as well as for any underlying conditions, to help minimize risk of complications. TREATMENT  Treatment for AFib is divided into several categories:  Treatment of any underlying condition.   Converting you out of AFib into a regular (sinus) rhythm.   Controlling rapid heart rate.   Prevention of blood clots and stroke.  Medications and procedures are available to convert your atrial fibrillation to sinus rhythm. However, recent studies have shown that this may not offer you any advantage, and cardiac experts are continuing research and debate on this topic. More important is controlling your rapid heartbeat. The rapid heartbeat causes more symptoms, and places strain on your heart. Your caregiver will advise you on the use of medications that can control your heart rate. Atrial fibrillation is a strong stroke risk. You can lessen this risk by taking  blood thinning medications such as Coumadin (warfarin), or sometimes aspirin. These medications need close monitoring by your caregiver. Over-medication can  cause bleeding. Too little medication may not protect against stroke. HOME CARE INSTRUCTIONS   If your caregiver prescribed medicine to make your heartbeat more normally, take as directed.   If blood thinners were prescribed by your caregiver, take EXACTLY as directed.   Perform blood tests EXACTLY as directed.   Quit smoking. Smoking increases your cardiac and lung (pulmonary) risks.   DO NOT drink alcohol.   DO NOT drink caffeinated drinks (e.g. coffee, soda, chocolate, and leaf teas). You may drink decaffeinated coffee, soda or tea.   If you are overweight, you should choose a reduced calorie diet to lose weight. Please see a registered dietitian if you need more information about healthy weight loss. DO NOT USE DIET PILLS as they may aggravate heart problems.   If you have other heart problems that are causing AFib, you may need to eat a low salt, fat, and cholesterol diet. Your caregiver will tell you if this is necessary.   Exercise every day to improve your physical fitness. Stay active unless advised otherwise.   If your caregiver has given you a follow-up appointment, it is very important to keep that appointment. Not keeping the appointment could result in heart failure or stroke. If there is any problem keeping the appointment, you must call back to this facility for assistance.  SEEK MEDICAL CARE IF:  You notice a change in the rate, rhythm or strength of your heartbeat.   You develop an infection or any other change in your overall health status.  SEEK IMMEDIATE MEDICAL CARE IF:   You develop chest pain, abdominal pain, sweating, weakness or feel sick to your stomach (nausea).   You develop shortness of breath.   You develop swollen feet and ankles.   You develop dizziness, numbness, or weakness of your face or limbs, or any change in vision or speech.  MAKE SURE YOU:   Understand these instructions.   Will watch your condition.   Will get help right away if you  are not doing well or get worse.  Document Released: 11/18/2005 Document Revised: 11/07/2011 Document Reviewed: 06/22/2008 [facility] Patient Information 2012 [place], [place].Chest Pain (Nonspecific) It is often hard to give a specific diagnosis for the cause of chest pain. There is always a chance that your pain could be related to something serious, such as a heart attack or a blood clot in the lungs. You need to follow up with your caregiver for further evaluation. CAUSES   Heartburn.   Pneumonia or bronchitis.   Anxiety or stress.   Inflammation around your heart (pericarditis) or lung (pleuritis or pleurisy).   A blood clot in the lung.   A collapsed lung (pneumothorax). It can develop suddenly on its own (spontaneous pneumothorax) or from injury (trauma) to the chest.   Shingles infection (herpes zoster virus).  The chest wall is composed of bones, muscles, and cartilage. Any of these can be the source of the pain.  The bones can be bruised by injury.   The muscles or cartilage can be strained by coughing or overwork.   The cartilage can be affected by inflammation and become sore (costochondritis).  DIAGNOSIS  Lab tests or other studies, such as X-rays, electrocardiography, stress testing, or cardiac imaging, may be needed to find the cause of your pain.  TREATMENT   Treatment depends on what may be causing your chest  pain. Treatment may include:   Acid blockers for heartburn.   Anti-inflammatory medicine.   Pain medicine for inflammatory conditions.   Antibiotics if an infection is present.   You may be advised to change lifestyle habits. This includes stopping smoking and avoiding alcohol, caffeine, and chocolate.   You may be advised to keep your head raised (elevated) when sleeping. This reduces the chance of acid going backward from your stomach into your esophagus.   Most of the time, nonspecific chest pain will improve within 2 to 3 days with rest and mild pain  medicine.  HOME CARE INSTRUCTIONS   If antibiotics were prescribed, take your antibiotics as directed. Finish them even if you start to feel better.   For the next few days, avoid physical activities that bring on chest pain. Continue physical activities as directed.   Do not smoke.   Avoid drinking alcohol.   Only take over-the-counter or prescription medicine for pain, discomfort, or fever as directed by your caregiver.   Follow your caregiver's suggestions for further testing if your chest pain does not go away.   Keep any follow-up appointments you made. If you do not go to an appointment, you could develop lasting (chronic) problems with pain. If there is any problem keeping an appointment, you must call to reschedule.  SEEK MEDICAL CARE IF:   You think you are having problems from the medicine you are taking. Read your medicine instructions carefully.   Your chest pain does not go away, even after treatment.   You develop a rash with blisters on your chest.  SEEK IMMEDIATE MEDICAL CARE IF:   You have increased chest pain or pain that spreads to your arm, neck, jaw, back, or abdomen.   You develop shortness of breath, an increasing cough, or you are coughing up blood.   You have severe back or abdominal pain, feel nauseous, or vomit.   You develop severe weakness, fainting, or chills.   You have a fever.  THIS IS AN EMERGENCY. Do not wait to see if the pain will go away. Get medical help at once. Call your local emergency services (911 in [place] Do not drive yourself to the hospital. MAKE SURE YOU:   Understand these instructions.   Will watch your condition.   Will get help right away if you are not doing well or get worse.  Document Released: 08/28/2005 Document Revised: 11/07/2011 Document Reviewed: 06/23/2008 [facility] Patient Information 2012 [place], [place].

## 2012-02-18 NOTE — ED Notes (Signed)
Cp started this am some sob this am  No n/v  Mostly on rt side and whole back

## 2014-01-14 ENCOUNTER — Encounter: Payer: Self-pay | Admitting: *Deleted

## 2014-05-08 ENCOUNTER — Encounter (HOSPITAL_COMMUNITY): Payer: Self-pay | Admitting: Emergency Medicine

## 2014-05-08 ENCOUNTER — Emergency Department (HOSPITAL_COMMUNITY): Payer: Medicare HMO

## 2014-05-08 ENCOUNTER — Observation Stay (HOSPITAL_COMMUNITY)
Admission: EM | Admit: 2014-05-08 | Discharge: 2014-05-10 | Disposition: A | Discharge status: Home Health | Payer: Medicare HMO | Attending: Internal Medicine | Admitting: Internal Medicine

## 2014-05-08 DIAGNOSIS — Z7901 Long term (current) use of anticoagulants: Secondary | ICD-10-CM | POA: Insufficient documentation

## 2014-05-08 DIAGNOSIS — J810 Acute pulmonary edema: Secondary | ICD-10-CM

## 2014-05-08 DIAGNOSIS — R42 Dizziness and giddiness: Secondary | ICD-10-CM | POA: Insufficient documentation

## 2014-05-08 DIAGNOSIS — E2749 Other adrenocortical insufficiency: Secondary | ICD-10-CM

## 2014-05-08 DIAGNOSIS — I10 Essential (primary) hypertension: Secondary | ICD-10-CM

## 2014-05-08 DIAGNOSIS — Z66 Do not resuscitate: Secondary | ICD-10-CM | POA: Insufficient documentation

## 2014-05-08 DIAGNOSIS — R531 Weakness: Secondary | ICD-10-CM

## 2014-05-08 DIAGNOSIS — M1990 Unspecified osteoarthritis, unspecified site: Secondary | ICD-10-CM | POA: Diagnosis present

## 2014-05-08 DIAGNOSIS — M169 Osteoarthritis of hip, unspecified: Secondary | ICD-10-CM | POA: Insufficient documentation

## 2014-05-08 DIAGNOSIS — E860 Dehydration: Secondary | ICD-10-CM

## 2014-05-08 DIAGNOSIS — I447 Left bundle-branch block, unspecified: Secondary | ICD-10-CM | POA: Insufficient documentation

## 2014-05-08 DIAGNOSIS — E039 Hypothyroidism, unspecified: Secondary | ICD-10-CM | POA: Insufficient documentation

## 2014-05-08 DIAGNOSIS — I4891 Unspecified atrial fibrillation: Secondary | ICD-10-CM | POA: Insufficient documentation

## 2014-05-08 DIAGNOSIS — M1610 Unilateral primary osteoarthritis, unspecified hip: Secondary | ICD-10-CM | POA: Insufficient documentation

## 2014-05-08 DIAGNOSIS — R011 Cardiac murmur, unspecified: Secondary | ICD-10-CM | POA: Insufficient documentation

## 2014-05-08 DIAGNOSIS — Z79899 Other long term (current) drug therapy: Secondary | ICD-10-CM | POA: Insufficient documentation

## 2014-05-08 DIAGNOSIS — I951 Orthostatic hypotension: Principal | ICD-10-CM | POA: Insufficient documentation

## 2014-05-08 DIAGNOSIS — I2510 Atherosclerotic heart disease of native coronary artery without angina pectoris: Secondary | ICD-10-CM | POA: Insufficient documentation

## 2014-05-08 DIAGNOSIS — Z7982 Long term (current) use of aspirin: Secondary | ICD-10-CM | POA: Insufficient documentation

## 2014-05-08 DIAGNOSIS — M79609 Pain in unspecified limb: Secondary | ICD-10-CM

## 2014-05-08 DIAGNOSIS — Z85850 Personal history of malignant neoplasm of thyroid: Secondary | ICD-10-CM | POA: Insufficient documentation

## 2014-05-08 DIAGNOSIS — E785 Hyperlipidemia, unspecified: Secondary | ICD-10-CM | POA: Insufficient documentation

## 2014-05-08 DIAGNOSIS — J309 Allergic rhinitis, unspecified: Secondary | ICD-10-CM

## 2014-05-08 DIAGNOSIS — R55 Syncope and collapse: Secondary | ICD-10-CM | POA: Diagnosis present

## 2014-05-08 DIAGNOSIS — E119 Type 2 diabetes mellitus without complications: Secondary | ICD-10-CM | POA: Insufficient documentation

## 2014-05-08 DIAGNOSIS — E0789 Other specified disorders of thyroid: Secondary | ICD-10-CM | POA: Insufficient documentation

## 2014-05-08 LAB — TROPONIN I: Troponin I: <0.3 ng/mL (ref <0.30)

## 2014-05-08 LAB — CBC WITH DIFFERENTIAL/PLATELET
BASOS ABS: 0.1 10*3/uL (ref 0.0–0.1)
BASOS PCT: 1 % (ref 0–1)
EOS ABS: 0.1 10*3/uL (ref 0.0–0.7)
EOS PCT: 1 % (ref 0–5)
HCT: 42.8 % (ref 36.0–46.0)
Hemoglobin: 14.5 g/dL (ref 12.0–15.0)
LYMPHS PCT: 15 % (ref 12–46)
Lymphs Abs: 1.6 10*3/uL (ref 0.7–4.0)
MCH: 31 pg (ref 26.0–34.0)
MCHC: 33.9 g/dL (ref 30.0–36.0)
MCV: 91.5 fL (ref 78.0–100.0)
Monocytes Absolute: 0.9 10*3/uL (ref 0.1–1.0)
Monocytes Relative: 8 % (ref 3–12)
Neutro Abs: 8.2 10*3/uL — ABNORMAL HIGH (ref 1.7–7.7)
Neutrophils Relative %: 75 % (ref 43–77)
PLATELETS: 173 10*3/uL (ref 150–400)
RBC: 4.68 MIL/uL (ref 3.87–5.11)
RDW: 13.1 % (ref 11.5–15.5)
WBC: 10.9 10*3/uL — AB (ref 4.0–10.5)

## 2014-05-08 LAB — URINALYSIS, ROUTINE W REFLEX MICROSCOPIC
Bilirubin Urine: NEGATIVE
GLUCOSE, UA: NEGATIVE mg/dL
HGB URINE DIPSTICK: NEGATIVE
Ketones, ur: NEGATIVE mg/dL
LEUKOCYTES UA: NEGATIVE
Nitrite: NEGATIVE
Protein, ur: NEGATIVE mg/dL
SPECIFIC GRAVITY, URINE: 1.003 — AB (ref 1.005–1.030)
Urobilinogen, UA: 0.2 mg/dL (ref 0.0–1.0)
pH: 5.5 (ref 5.0–8.0)

## 2014-05-08 LAB — PROTIME-INR
INR: 1.71 — AB (ref 0.00–1.49)
PROTHROMBIN TIME: 19.6 s — AB (ref 11.6–15.2)

## 2014-05-08 LAB — COMPREHENSIVE METABOLIC PANEL
ALBUMIN: 3.6 g/dL (ref 3.5–5.2)
ALT: 14 U/L (ref 0–35)
AST: 16 U/L (ref 0–37)
Alkaline Phosphatase: 71 U/L (ref 39–117)
BUN: 18 mg/dL (ref 6–23)
CALCIUM: 9.3 mg/dL (ref 8.4–10.5)
CO2: 26 meq/L (ref 19–32)
Chloride: 103 mEq/L (ref 96–112)
Creatinine, Ser: 0.64 mg/dL (ref 0.50–1.10)
GFR calc Af Amer: 86 mL/min — ABNORMAL LOW (ref >90)
GFR calc non Af Amer: 75 mL/min — ABNORMAL LOW (ref >90)
Glucose, Bld: 139 mg/dL — ABNORMAL HIGH (ref 70–99)
Potassium: 4 mEq/L (ref 3.7–5.3)
SODIUM: 143 meq/L (ref 137–147)
TOTAL PROTEIN: 6.7 g/dL (ref 6.0–8.3)
Total Bilirubin: 0.5 mg/dL (ref 0.3–1.2)

## 2014-05-08 LAB — PRO B NATRIURETIC PEPTIDE: Pro B Natriuretic peptide (BNP): 1422 pg/mL — ABNORMAL HIGH (ref 0–450)

## 2014-05-08 MED ORDER — SODIUM CHLORIDE 0.9 % IJ SOLN
3.0000 mL | Freq: Two times a day (BID) | INTRAMUSCULAR | Status: DC
  Administered 2014-05-10: 3 mL via INTRAVENOUS

## 2014-05-08 MED ORDER — NITROGLYCERIN 0.4 MG SL SUBL: 0.4000 mg | SUBLINGUAL_TABLET | SUBLINGUAL | Status: DC | PRN

## 2014-05-08 MED ORDER — HYDROMORPHONE HCL PF 1 MG/ML IJ SOLN: 1.0000 mg | INTRAMUSCULAR | Status: DC | PRN

## 2014-05-08 MED ORDER — METOPROLOL SUCCINATE ER 50 MG PO TB24
50.0000 mg | ORAL_TABLET | Freq: Every day | ORAL | Status: DC
  Administered 2014-05-09: 50 mg via ORAL
  Filled 2014-05-08 (×2): qty 1

## 2014-05-08 MED ORDER — ENOXAPARIN SODIUM 40 MG/0.4ML ~~LOC~~ SOLN
40.0000 mg | SUBCUTANEOUS | Status: DC
  Filled 2014-05-08: qty 0.4

## 2014-05-08 MED ORDER — ONDANSETRON HCL 4 MG/2ML IJ SOLN: 4.0000 mg | Freq: Four times a day (QID) | INTRAMUSCULAR | Status: DC | PRN

## 2014-05-08 MED ORDER — SIMVASTATIN 40 MG PO TABS
40.0000 mg | ORAL_TABLET | Freq: Every morning | ORAL | Status: DC
  Administered 2014-05-09 – 2014-05-10 (×2): 40 mg via ORAL
  Filled 2014-05-08 (×2): qty 1

## 2014-05-08 MED ORDER — SODIUM CHLORIDE 0.9 % IV BOLUS (SEPSIS)
500.0000 mL | Freq: Once | INTRAVENOUS | Status: AC
  Administered 2014-05-08: 500 mL via INTRAVENOUS

## 2014-05-08 MED ORDER — WARFARIN - PHARMACIST DOSING INPATIENT: Freq: Every day | Status: DC

## 2014-05-08 MED ORDER — WARFARIN SODIUM 4 MG PO TABS
4.0000 mg | ORAL_TABLET | Freq: Once | ORAL | Status: AC
  Administered 2014-05-08: 4 mg via ORAL
  Filled 2014-05-08: qty 1

## 2014-05-08 MED ORDER — ONDANSETRON HCL 4 MG PO TABS: 4.0000 mg | ORAL_TABLET | Freq: Four times a day (QID) | ORAL | Status: DC | PRN

## 2014-05-08 MED ORDER — VITAMIN B-12 100 MCG PO TABS
100.0000 ug | ORAL_TABLET | Freq: Every day | ORAL | Status: DC
  Administered 2014-05-09 – 2014-05-10 (×2): 100 ug via ORAL
  Filled 2014-05-08 (×2): qty 1

## 2014-05-08 MED ORDER — ACETAMINOPHEN 650 MG RE SUPP: 650.0000 mg | Freq: Four times a day (QID) | RECTAL | Status: DC | PRN

## 2014-05-08 MED ORDER — OCUVITE-LUTEIN PO CAPS
1.0000 | ORAL_CAPSULE | Freq: Every day | ORAL | Status: DC
  Administered 2014-05-09 – 2014-05-10 (×2): 1 via ORAL
  Filled 2014-05-08 (×2): qty 1

## 2014-05-08 MED ORDER — ICAPS MV PO TABS: 2.0000 | ORAL_TABLET | Freq: Two times a day (BID) | ORAL | Status: DC

## 2014-05-08 MED ORDER — ACETAMINOPHEN 325 MG PO TABS: 650.0000 mg | ORAL_TABLET | Freq: Four times a day (QID) | ORAL | Status: DC | PRN

## 2014-05-08 MED ORDER — ISOSORBIDE MONONITRATE ER 30 MG PO TB24
30.0000 mg | ORAL_TABLET | Freq: Every day | ORAL | Status: DC
  Administered 2014-05-08 – 2014-05-10 (×3): 30 mg via ORAL
  Filled 2014-05-08 (×3): qty 1

## 2014-05-08 MED ORDER — DORZOLAMIDE HCL-TIMOLOL MAL 2-0.5 % OP SOLN
1.0000 [drp] | Freq: Two times a day (BID) | OPHTHALMIC | Status: DC
  Administered 2014-05-08 – 2014-05-10 (×4): 1 [drp] via OPHTHALMIC
  Filled 2014-05-08: qty 10

## 2014-05-08 MED ORDER — SODIUM CHLORIDE 0.9 % IV SOLN
INTRAVENOUS | Status: DC
  Administered 2014-05-08 – 2014-05-09 (×2): via INTRAVENOUS

## 2014-05-08 MED ORDER — LATANOPROST 0.005 % OP SOLN
1.0000 [drp] | Freq: Every day | OPHTHALMIC | Status: DC
  Administered 2014-05-08 – 2014-05-09 (×2): 1 [drp] via OPHTHALMIC
  Filled 2014-05-08: qty 2.5

## 2014-05-08 MED ORDER — ACETAMINOPHEN 500 MG PO TABS
1000.0000 mg | ORAL_TABLET | Freq: Four times a day (QID) | ORAL | Status: DC
  Administered 2014-05-08 – 2014-05-10 (×6): 1000 mg via ORAL
  Filled 2014-05-08 (×9): qty 2

## 2014-05-08 MED ORDER — LEVOTHYROXINE SODIUM 112 MCG PO TABS
112.0000 ug | ORAL_TABLET | Freq: Every day | ORAL | Status: DC
  Administered 2014-05-09 – 2014-05-10 (×2): 112 ug via ORAL
  Filled 2014-05-08 (×3): qty 1

## 2014-05-08 MED ORDER — ASPIRIN EC 81 MG PO TBEC
81.0000 mg | DELAYED_RELEASE_TABLET | Freq: Every day | ORAL | Status: DC
  Administered 2014-05-08 – 2014-05-10 (×3): 81 mg via ORAL
  Filled 2014-05-08 (×3): qty 1

## 2014-05-08 NOTE — H&P (Signed)
History and Physical       Hospital Admission Note Date: 05/08/2014  Patient name: Kathleen Mccarty Medical record number: [id number] Date of birth: Nov 11, 1918 Age: 80 y.o. Gender: female  PCP: HARPER,SCOTT    Chief Complaint:  Dizziness since morning  HPI: Patient is a 80 year old female with past medical history of hypertension, hyperlipidemia, atrial fibrillation on Coumadin, thyroid cancer with thyroidectomy, osteoarthritis, worse in left hip presented to the ED with dizziness and weakness since morning. History was obtained from the patient and her daughter in the room. Patient was in her baseline state of health yesterday with no issues. Per patient, she was eating her breakfast when she suddenly felt weak and dizzy. The patient did not have any syncopal episode. Patient reported that she was "worked up" prior to this episode as her caregiver who had come to take care of her excused herself "being sick". Patient or her daughter who recommended to take her nitroglycerin, she took 3 tablets in the dizziness actually worsened. Patient continued to feel weak and dizzy even after resting hence she called EMS. She also noticed that she was belching a lot today. Otherwise she denied any chest pain, shortness of breath, palpitations, any diaphoresis.She denied any headaches, focal weakness, numbness or tingling, any loss of vision.    Review of Systems:  Constitutional: Denies fever, chills, diaphoresis, poor appetite and fatigue.  HEENT: Denies photophobia, eye pain, redness, hearing loss, ear pain, congestion, sore throat, rhinorrhea, sneezing, mouth sores, trouble swallowing, neck pain, neck stiffness and tinnitus.   Respiratory: Denies SOB, DOE, cough, chest tightness,  and wheezing.   Cardiovascular: Denies chest pain, palpitations and leg swelling.  Gastrointestinal: Denies nausea, vomiting, abdominal pain, diarrhea, constipation, blood  in stool and abdominal distention.  Genitourinary: Denies dysuria, urgency, frequency, hematuria, flank pain and difficulty urinating.  Musculoskeletal: Denies myalgias, back pain.  patient has osteoarthritis in her left hip and had received cortisone  Injection last Wednesday  Neurological :  please see history of present illness   Hematological: Denies adenopathy. Easy bruising, personal or family bleeding history  Psychiatric/Behavioral: Denies suicidal ideation, mood changes, confusion, nervousness, sleep disturbance and agitation  Past Medical History: Past Medical History  Diagnosis Date  . Dysrhythmia   . Cancer   . Arthritis   . History of thyroidectomy   . Hypertension   . Hyperlipidemia   . Atrial fibrillation     on coumadin   Past Surgical History  Procedure Laterality Date  . Breast surgery    . Abdominal hysterectomy      Medications: Prior to Admission medications   Medication Sig Start Date End Date Taking? Authorizing Provider  acetaminophen (TYLENOL) 500 MG tablet Take 1,000 mg by mouth 4 (four) times daily.    Yes Historical Provider, MD  aspirin EC 81 MG tablet Take 81 mg by mouth every evening.   Yes Historical Provider, MD  Cyanocobalamin (VITAMIN B-12 PO) Take 1 tablet by mouth daily before breakfast.   Yes Historical Provider, MD  dorzolamide-timolol (COSOPT) 22.3-6.8 MG/ML ophthalmic solution Place 1 drop into the left eye 2 (two) times daily.    Yes Historical Provider, MD  furosemide (LASIX) 20 MG tablet Take 20 mg by mouth every morning.    Yes Historical Provider, MD  isosorbide mononitrate (IMDUR) 60 MG 24 hr tablet Take 60 mg by mouth every evening.    Yes Historical Provider, MD  levothyroxine (SYNTHROID, LEVOTHROID) 112 MCG tablet Take 112 mcg by mouth daily before breakfast.  Yes Historical Provider, MD  metoprolol succinate (TOPROL-XL) 100 MG 24 hr tablet Take 100 mg by mouth daily after breakfast. Take with or immediately following a meal.    Yes Historical Provider, MD  Multiple Vitamins-Minerals (ICAPS MV) TABS Take 2 tablets by mouth 2 (two) times daily.    Yes Historical Provider, MD  nitroGLYCERIN (NITROSTAT) 0.4 MG SL tablet Place 0.4 mg under the tongue every 5 (five) minutes as needed. For chest pain   Yes Historical Provider, MD  PRESCRIPTION MEDICATION Type of injection for hip pain given at doctors office Unknown name of injection   Yes Historical Provider, MD  simvastatin (ZOCOR) 40 MG tablet Take 40 mg by mouth every morning.    Yes Historical Provider, MD  Travoprost, BAK Free, (TRAVATAN) 0.004 % SOLN ophthalmic solution Place 1 drop into the left eye at bedtime.     Yes Historical Provider, MD  warfarin (COUMADIN) 4 MG tablet Take 2-4 mg by mouth every evening. Takes 2mg  on Monday's and 4mg  the other days   Yes Historical Provider, MD    Allergies:   Allergies  Allergen Reactions  . Amoxicillin     "Hard on stomach"  . Cephalosporins     "hard on stomach"  . Codeine     Unsure of reaction, may have been rash or may have been confusion  . Eggs Or Egg-Derived Products Swelling    Whole body swelling can't have raw eggs  . Iodine     Unknown reaction   . Shellfish Allergy Swelling    Whole body swells   . Adhesive [Tape] Rash    Sensitive to bandages    Social History:  reports that she has never smoked. She has never used smokeless tobacco. She reports that she does not drink alcohol or use illicit drugs.  Family History: No family history on file.  Physical Exam: Blood pressure 119/60, pulse 99, temperature 97.9 F (36.6 C), temperature source Oral, resp. rate 16, SpO2 98.00%. General: Alert, awake, oriented x3, in no acute distress. HEENT: normocephalic, atraumatic, anicteric sclera, pink conjunctiva, pupils equal and reactive to light and accomodation, oropharynx clear Neck: supple, no masses or lymphadenopathy, no goiter, no bruits  Heart: ireg ireg, 2/6 SEM Lungs: Clear to auscultation  bilaterally, no wheezing, rales or rhonchi. Abdomen: Soft, nontender, nondistended, positive bowel sounds, no masses. Extremities: No clubbing, cyanosis or edema with positive pedal pulses. Neuro: Grossly intact, no focal neurological deficits, strength 5/5 upper and lower extremities  right side, on left 5/5 strength in upper extremities, pain in left hip on lifting in the left lower leg however patient was able to 4/5. (Per daughter, this is chronic ) Psych: alert and oriented x 3, normal mood and affect Skin: no rashes or lesions, warm and dry   LABS on Admission:  Basic Metabolic Panel:  Recent Labs Lab 05/08/14 1020  NA 143  K 4.0  CL 103  CO2 26  GLUCOSE 139*  BUN 18  CREATININE 0.64  CALCIUM 9.3   Liver Function Tests:  Recent Labs Lab 05/08/14 1020  AST 16  ALT 14  ALKPHOS 71  BILITOT 0.5  PROT 6.7  ALBUMIN 3.6   No results found for this basename: LIPASE, AMYLASE,  in the last 168 hours No results found for this basename: AMMONIA,  in the last 168 hours CBC:  Recent Labs Lab 05/08/14 1020  WBC 10.9*  NEUTROABS 8.2*  HGB 14.5  HCT 42.8  MCV 91.5  PLT 173  Cardiac Enzymes:  Recent Labs Lab 05/08/14 1020 05/08/14 1513  TROPONINI <0.30 <0.30   BNP: No components found with this basename: POCBNP,  CBG: No results found for this basename: GLUCAP,  in the last 168 hours   Radiological Exams on Admission: Dg Chest 2 View  05/08/2014   CLINICAL DATA:  Presyncope. Lightheadedness. Weakness. Atrial fibrillation. Hypertension.  EXAM: CHEST  2 VIEW  COMPARISON:  02/18/12  FINDINGS: Mild cardiomegaly is stable. Both lungs are clear. No evidence of pleural effusion. No mass or lymphadenopathy identified. Ectasia thoracic aorta is stable.  IMPRESSION: Stable mild cardiomegaly.  No active lung disease.   Electronically Signed   By: Earle Gell M.D.   On: 05/08/2014 14:03   EKG : 87, atrial fibrillation, LBBB (old)   Assessment/Plan Principal Problem:    Near syncope: sudden onset today with orthostasis possible dehydration, +heart murmur, has a history of mitral regurgitation - positive orthostatics in ED, admit to telemetry, r/o acute ACS, arrhythmias, obtain B12, folate, TSH, cortisol - Hold Lasix tonight, decreased Toprol-XL, continue Imdur -  BNP elevated at 1422, no pulmonary venous congestion on CXR, place on gentle & cautious hydration - discussed with patient and daughter regarding heart murmur (known to patient) and possible cardiac cause of near syncope, they declined ECHO. Patient declined CT head/ MRI brain stating that she does not want any intervention at her age any ways even if there is worsening of heart valve issue or underlying CVA or neurological issue. - PT/OT  evaluation         Active Problems:   A-fib - Rate controlled, continue warfarin per pharmacy    DM type 2 (diabetes mellitus, type 2) - Diet controlled    CAD (coronary artery disease) -Currently stable, continue beta blocker, Imdur, nitroglycerin as needed, statin, Coumadin     HTN (hypertension) - Currently orthostatics positive, as #1    Hypothyroidism - Check TSH continue Synthroid    Osteoarthritis- left hip - PTOT evaluation   DVT prophylaxis:  Lovenox   CODE STATUS: discussed in detail with the patient, she opted for DNR/DNI status   Family Communication: Admission, patients condition and plan of care including tests being ordered have been discussed with the patient and daughter who indicates understanding and agree with the plan and Code Status   Further plan will depend as patient's clinical course evolves and further radiologic and laboratory data become available.   Time Spent on Admission: 1 hour  Ripudeep Krystal Eaton M.D. [facility] 05/08/2014, 5:00 PM Pager: [phone]  If 7PM-7AM, please contact night-coverage [email or web address] Password [email or web address]  **Disclaimer: This note was dictated with voice recognition software. Similar sounding  words can inadvertently be transcribed and this note may contain transcription errors which may not have been corrected upon publication of note.**

## 2014-05-08 NOTE — ED Notes (Signed)
Attempted to call report at this time 

## 2014-05-08 NOTE — ED Notes (Addendum)
Per EMS - pt woke up this morning feeling weak.  Pt took 3 nitro tablets at home (at her daughter's suggestion, but she denies chest pain).  Pt then felt "a little dizzy."  Pt's initial BP was 102/64, reassessed was 124/76.  Pt denies chest pain, N/V/D, headache and fever.  Pt denies SOB.  Pt's neuro exam was normal.

## 2014-05-08 NOTE — Progress Notes (Signed)
ANTICOAGULATION CONSULT NOTE - Initial Consult  Pharmacy Consult for Warfarin Indication: Hx A.Fib  Allergies  Allergen Reactions  . Amoxicillin     "Hard on stomach"  . Cephalosporins     "hard on stomach"  . Codeine     Unsure of reaction, may have been rash or may have been confusion  . Eggs Or Egg-Derived Products Swelling    Whole body swelling can't have raw eggs  . Iodine     Unknown reaction   . Shellfish Allergy Swelling    Whole body swells   . Adhesive [Tape] Rash    Sensitive to bandages    Patient Measurements:    Vital Signs: Temp: 98 F (36.7 C) (06/07 1734) Temp src: Oral (06/07 1734) BP: 134/84 mmHg (06/07 1734) Pulse Rate: 86 (06/07 1734)  Labs:  Recent Labs  05/08/14 1020 05/08/14 1230 05/08/14 1513  HGB 14.5  --   --   HCT 42.8  --   --   PLT 173  --   --   LABPROT  --  19.6*  --   INR  --  1.71*  --   CREATININE 0.64  --   --   TROPONINI <0.30  --  <0.30    The CrCl is unknown because both a height and weight (above a minimum accepted value) are required for this calculation.   Medical History: Past Medical History  Diagnosis Date  . Dysrhythmia   . Cancer   . Arthritis   . History of thyroidectomy   . Hypertension   . Hyperlipidemia   . Atrial fibrillation     on coumadin    Medications:  Scheduled:  . acetaminophen  1,000 mg Oral QID  . aspirin EC  81 mg Oral Daily  . dorzolamide-timolol  1 drop Left Eye BID  . enoxaparin (LOVENOX) injection  40 mg Subcutaneous Q24H  . isosorbide mononitrate  30 mg Oral Daily  . latanoprost  1 drop Left Eye QHS  . [START ON 05/09/2014] levothyroxine  112 mcg Oral QAC breakfast  . [START ON 05/09/2014] metoprolol succinate  50 mg Oral QPC breakfast  . [START ON 05/09/2014] multivitamin-lutein  1 capsule Oral Daily  . [START ON 05/09/2014] simvastatin  40 mg Oral q morning - 10a  . sodium chloride  3 mL Intravenous Q12H  . [START ON 05/09/2014] vitamin B-12  100 mcg Oral Daily   Infusions:  .  sodium chloride     PRN: acetaminophen, acetaminophen, HYDROmorphone (DILAUDID) injection, nitroGLYCERIN, ondansetron (ZOFRAN) IV, ondansetron  Assessment:  80 yo F with Hx of atrial fibrillation on chronic anticoagulation with warfarin.  Patient presented to ER with near syncope.  Pharmacy asked to continue warfarin in-patient.   Home dose 2 mg on mondays and 4 mg all other days; Last dose reported taken on 6/6  INR on admission 1.71 is below goal of 2-3  H/H WNL, Plt 173  Goal of Therapy:  INR goal 2-3 Monitor platelets by anticoagulation protocol: Yes   Plan:  1.) Warfarin 4 mg tonight - suspect patient did not eat much today so will not give > home dose for tonight  2.) Daily PT/INR 3.) Discontinue lovenox for VTE px  Gaye Alken [place] PharmD Pager #: [phone] 6:34 PM 05/08/2014

## 2014-05-08 NOTE — ED Notes (Signed)
Bed: WA04 Expected date:  Expected time:  Means of arrival:  Comments: EMS, 80 y.o.

## 2014-05-08 NOTE — ED Notes (Signed)
Pt comes from home where she lives with caregivers almost 24 hours a day.  Pt states this morning she began feeling weak.  Pt states she also had one episode of dizziness and caregiver states she c/o nausea earlier this morning.  Pt states onset of weakness and nausea was @ 9 am this morning after she ate breakfast.  Pt also states she has felt like burping more than usual today.  Pt is A&O x4.  Pt's lung sounds are clear, bowel sounds present.  Pt MAE and pulses palpated in all 4 extremities.  Grips = bilat.  LE strength = bilat.  Tongue midline, smile symmetrical, no arm drift.  Pt also c/o some SOB at rest.  Pt denies cough or wheezing.  Pt denies urinary symptoms.

## 2014-05-08 NOTE — ED Provider Notes (Signed)
CSN: [id number]     Arrival date & time 05/08/14  1154 History   First MD Initiated Contact with Patient 05/08/14 1159     Chief Complaint  Patient presents with  . Weakness     (Consider location/radiation/quality/duration/timing/severity/associated sxs/prior Treatment) Patient is a 80 y.o. female presenting with weakness. The history is provided by the patient and a relative. No language interpreter was used.  Weakness Associated symptoms include nausea and weakness. Pertinent negatives include no abdominal pain, chest pain, chills, fever, neck pain or vomiting.  Kathleen Mccarty is a 80 year old female with past medical history of thyroid cancer with thyroidectomy, hypertension, hyperlipidemia, atrial fibrillation on Coumadin, arthritis presenting to the ED with weakness. As per patient, reported that this morning after eating breakfast she suddenly felt weak. Stated that she felt as if she was about to faint-stated that if she was to walk into the living room that she would not make it due to passing out. Denied syncopal episode. Stated that during this episode she felt rather nauseous and short of breath. Stated that she decided to sit down in the kitchen and stay sitting. Reported that she called her daughter who recommended patient to take nitroglycerin-patient reported that she took 3 tablets of nitroglycerin with no relief. Stated that after sitting for one hour and continued to feel weak, stated that she then called EMS. Reported that she's been burping a lot. Denied dizziness, syncope, nausea, vomiting, diarrhea, melena, hematochezia, chest pain, shortness of breath, difficulty breathing, fever, chills, sudden loss of vision, blurred vision, numbness, tingling, abdominal pain. PCP none  Past Medical History  Diagnosis Date  . Dysrhythmia   . Cancer   . Arthritis   . History of thyroidectomy   . Hypertension   . Hyperlipidemia   . Atrial fibrillation     on coumadin   Past  Surgical History  Procedure Laterality Date  . Breast surgery    . Abdominal hysterectomy     No family history on file. History  Substance Use Topics  . Smoking status: Never Smoker   . Smokeless tobacco: Never Used  . Alcohol Use: No   OB History   Grav Para Term Preterm Abortions TAB SAB Ect Mult Living                 Review of Systems  Constitutional: Negative for fever and chills.  Respiratory: Positive for shortness of breath. Negative for chest tightness.   Cardiovascular: Negative for chest pain and leg swelling.  Gastrointestinal: Positive for nausea. Negative for vomiting and abdominal pain.  Musculoskeletal: Negative for back pain and neck pain.  Neurological: Positive for weakness and light-headedness. Negative for syncope.      Allergies  Amoxicillin; Cephalosporins; Codeine; Eggs or egg-derived products; Iodine; Shellfish allergy; and Adhesive  Home Medications   Prior to Admission medications   Medication Sig Start Date End Date Taking? Authorizing Provider  acetaminophen (TYLENOL) 500 MG tablet Take 1,000 mg by mouth 4 (four) times daily.    Yes Historical Provider, MD  aspirin EC 81 MG tablet Take 81 mg by mouth every evening.   Yes Historical Provider, MD  Cyanocobalamin (VITAMIN B-12 PO) Take 1 tablet by mouth daily before breakfast.   Yes Historical Provider, MD  dorzolamide-timolol (COSOPT) 22.3-6.8 MG/ML ophthalmic solution Place 1 drop into the left eye 2 (two) times daily.    Yes Historical Provider, MD  furosemide (LASIX) 20 MG tablet Take 20 mg by mouth every morning.  Yes Historical Provider, MD  isosorbide mononitrate (IMDUR) 60 MG 24 hr tablet Take 60 mg by mouth every evening.    Yes Historical Provider, MD  levothyroxine (SYNTHROID, LEVOTHROID) 112 MCG tablet Take 112 mcg by mouth daily before breakfast.    Yes Historical Provider, MD  metoprolol succinate (TOPROL-XL) 100 MG 24 hr tablet Take 100 mg by mouth daily after breakfast. Take with  or immediately following a meal.   Yes Historical Provider, MD  Multiple Vitamins-Minerals (ICAPS MV) TABS Take 2 tablets by mouth 2 (two) times daily.    Yes Historical Provider, MD  nitroGLYCERIN (NITROSTAT) 0.4 MG SL tablet Place 0.4 mg under the tongue every 5 (five) minutes as needed. For chest pain   Yes Historical Provider, MD  PRESCRIPTION MEDICATION Type of injection for hip pain given at doctors office Unknown name of injection   Yes Historical Provider, MD  simvastatin (ZOCOR) 40 MG tablet Take 40 mg by mouth every morning.    Yes Historical Provider, MD  Travoprost, BAK Free, (TRAVATAN) 0.004 % SOLN ophthalmic solution Place 1 drop into the left eye at bedtime.     Yes Historical Provider, MD  warfarin (COUMADIN) 4 MG tablet Take 2-4 mg by mouth every evening. Takes 2mg  on Monday's and 4mg  the other days   Yes Historical Provider, MD   BP 119/60  Pulse 99  Temp(Src) 97.9 F (36.6 C) (Oral)  Resp 16  SpO2 98% Physical Exam  Nursing note and vitals reviewed. Constitutional: She is oriented to person, place, and time. She appears well-developed and well-nourished. No distress.  Patient sitting comfortably upright in bed  HENT:  Head: Normocephalic and atraumatic.  Mouth/Throat: Oropharynx is clear and moist. No oropharyngeal exudate.  Eyes: Conjunctivae and EOM are normal. Pupils are equal, round, and reactive to light. Right eye exhibits no discharge. Left eye exhibits no discharge.  Negative nystagmus Visual fields grossly intact  Neck: Normal range of motion. Neck supple. No tracheal deviation present.  Negative neck stiffness Negative nuchal rigidity Negative cervical lymphadenopathy Negative meningeal signs  Negative pain upon palpation to the cervical spine  Cardiovascular: Normal rate.  An irregularly irregular rhythm present. Exam reveals no friction rub.   No murmur heard. Pulses:      Radial pulses are 2+ on the right side, and 2+ on the left side.       Dorsalis  pedis pulses are 2+ on the right side, and 2+ on the left side.  Cap refill less than 3 seconds Negative swelling or pitting edema identified to lower extremities  Pulmonary/Chest: Effort normal and breath sounds normal. No respiratory distress. She has no wheezes. She has no rales.  Abdominal: Soft. Bowel sounds are normal. She exhibits no distension. There is tenderness in the right upper quadrant and right lower quadrant. There is no rigidity, no rebound and no guarding.    Negative abdominal distention Bowel sounds normal active in all 4 quadrants Abdomen soft Discomfort upon palpation to the right upper quadrant as well as right lower quadrant Negative peritoneal signs -negative guarding or rigidity noted  Musculoskeletal: Normal range of motion.  Decreased range of motion to the left hip secondary to pain-patient recently had cortisone injection placed approximately one week ago-chronic issue.  Lymphadenopathy:    She has no cervical adenopathy.  Neurological: She is alert and oriented to person, place, and time. No cranial nerve deficit. She exhibits normal muscle tone. Coordination normal.  Cranial nerves III-XII grossly intact Strength 5+/5+ to upper  and lower extremities bilaterally with resistance applied, equal distribution noted Equal grip strength bilaterally Sensation intact Negative facial droop Negative slurred speech Negative aphasia Negative arm drift Fine motor skills intact Patient follows commands well Gait proper, proper balance - negative sway, negative drift, negative step-offs  Skin: Skin is warm and dry. No rash noted. She is not diaphoretic. No erythema.  Psychiatric: She has a normal mood and affect. Her behavior is normal. Thought content normal.    ED Course  Procedures (including critical care time)  Results for orders placed during the hospital encounter of 05/08/14  CBC WITH DIFFERENTIAL      Result Value Ref Range   WBC 10.9 (*) 4.0 - 10.5  K/uL   RBC 4.68  3.87 - 5.11 MIL/uL   Hemoglobin 14.5  12.0 - 15.0 g/dL   HCT 42.8  36.0 - 46.0 %   MCV 91.5  78.0 - 100.0 fL   MCH 31.0  26.0 - 34.0 pg   MCHC 33.9  30.0 - 36.0 g/dL   RDW 13.1  11.5 - 15.5 %   Platelets 173  150 - 400 K/uL   Neutrophils Relative % 75  43 - 77 %   Neutro Abs 8.2 (*) 1.7 - 7.7 K/uL   Lymphocytes Relative 15  12 - 46 %   Lymphs Abs 1.6  0.7 - 4.0 K/uL   Monocytes Relative 8  3 - 12 %   Monocytes Absolute 0.9  0.1 - 1.0 K/uL   Eosinophils Relative 1  0 - 5 %   Eosinophils Absolute 0.1  0.0 - 0.7 K/uL   Basophils Relative 1  0 - 1 %   Basophils Absolute 0.1  0.0 - 0.1 K/uL  COMPREHENSIVE METABOLIC PANEL      Result Value Ref Range   Sodium 143  137 - 147 mEq/L   Potassium 4.0  3.7 - 5.3 mEq/L   Chloride 103  96 - 112 mEq/L   CO2 26  19 - 32 mEq/L   Glucose, Bld 139 (*) 70 - 99 mg/dL   BUN 18  6 - 23 mg/dL   Creatinine, Ser 0.64  0.50 - 1.10 mg/dL   Calcium 9.3  8.4 - 10.5 mg/dL   Total Protein 6.7  6.0 - 8.3 g/dL   Albumin 3.6  3.5 - 5.2 g/dL   AST 16  0 - 37 U/L   ALT 14  0 - 35 U/L   Alkaline Phosphatase 71  39 - 117 U/L   Total Bilirubin 0.5  0.3 - 1.2 mg/dL   GFR calc non Af Amer 75 (*) >90 mL/min   GFR calc Af Amer 86 (*) >90 mL/min  TROPONIN I      Result Value Ref Range   Troponin I <0.30  <0.30 ng/mL  URINALYSIS, ROUTINE W REFLEX MICROSCOPIC      Result Value Ref Range   Color, Urine YELLOW  YELLOW   APPearance CLEAR  CLEAR   Specific Gravity, Urine 1.003 (*) 1.005 - 1.030   pH 5.5  5.0 - 8.0   Glucose, UA NEGATIVE  NEGATIVE mg/dL   Hgb urine dipstick NEGATIVE  NEGATIVE   Bilirubin Urine NEGATIVE  NEGATIVE   Ketones, ur NEGATIVE  NEGATIVE mg/dL   Protein, ur NEGATIVE  NEGATIVE mg/dL   Urobilinogen, UA 0.2  0.0 - 1.0 mg/dL   Nitrite NEGATIVE  NEGATIVE   Leukocytes, UA NEGATIVE  NEGATIVE  PROTIME-INR      Result Value Ref Range  Prothrombin Time 19.6 (*) 11.6 - 15.2 seconds   INR 1.71 (*) 0.00 - 1.49  PRO B NATRIURETIC  PEPTIDE      Result Value Ref Range   Pro B Natriuretic peptide (BNP) 1422.0 (*) 0 - 450 pg/mL  TROPONIN I      Result Value Ref Range   Troponin I <0.30  <0.30 ng/mL    Labs Review Labs Reviewed  CBC WITH DIFFERENTIAL - Abnormal; Notable for the following:    WBC 10.9 (*)    Neutro Abs 8.2 (*)    All other components within normal limits  COMPREHENSIVE METABOLIC PANEL - Abnormal; Notable for the following:    Glucose, Bld 139 (*)    GFR calc non Af Amer 75 (*)    GFR calc Af Amer 86 (*)    All other components within normal limits  URINALYSIS, ROUTINE W REFLEX MICROSCOPIC - Abnormal; Notable for the following:    Specific Gravity, Urine 1.003 (*)    All other components within normal limits  PROTIME-INR - Abnormal; Notable for the following:    Prothrombin Time 19.6 (*)    INR 1.71 (*)    All other components within normal limits  PRO B NATRIURETIC PEPTIDE - Abnormal; Notable for the following:    Pro B Natriuretic peptide (BNP) 1422.0 (*)    All other components within normal limits  TROPONIN I  TROPONIN I    Imaging Review Dg Chest 2 View  05/08/2014   CLINICAL DATA:  Presyncope. Lightheadedness. Weakness. Atrial fibrillation. Hypertension.  EXAM: CHEST  2 VIEW  COMPARISON:  02/18/12  FINDINGS: Mild cardiomegaly is stable. Both lungs are clear. No evidence of pleural effusion. No mass or lymphadenopathy identified. Ectasia thoracic aorta is stable.  IMPRESSION: Stable mild cardiomegaly.  No active lung disease.   Electronically Signed   By: Earle Gell M.D.   On: 05/08/2014 14:03     EKG Interpretation   Date/Time:  Sunday May 08 2014 12:02:12 EDT Ventricular Rate:  87 PR Interval:    QRS Duration: 136 QT Interval:  396 QTC Calculation: 476 R Axis:   -17 Text Interpretation:  Atrial fibrillation Left bundle branch block Left  bundle branch block seen on one prior EKG Confirmed by HORTON  MD,  COURTNEY ([id number]) on 05/08/2014 12:12:20 PM      MDM   Final  diagnoses:  Dehydration  Weakness   Medications  sodium chloride 0.9 % bolus 500 mL (500 mLs Intravenous New Bag/Given 05/08/14 1533)   Filed Vitals:   05/08/14 1343 05/08/14 1525 05/08/14 1527 05/08/14 1528  BP: 135/77 139/75 124/81 119/60  Pulse:  77 92 99  Temp:      TempSrc:      Resp: 16     SpO2: 98%       EKG noted atrial fibrillation with a bundle branch block-as per seen on previous tracing with a heart rate of 87 beats per minute. Troponin negative elevation. Second troponin negative elevation. BNP elevated at 1422.0. CBC noted mild elevated white cell count of 10.9-negative pressure to the precipitous noted. CMP noted proper functioning of kidney and liver. Electrolytes properly balanced. INR 1.71, PT 19.6. Urinalysis negative for hemoglobin-negative nitrites leukocytes noted. Chest x-ray noted stable mild cardiomegaly with no active lung disease. Elevated BNP with negative findings of fluids overload.  Orthostatics performed and were positive-going from a laying to standing position patient's heart rate jumped from 77 beats per minute to 99 beats per minute. Blood pressure  dropped from 139/75-119/60. Patient appears to be dehydrated. Controlled hydration needed, but in a controlled setting secondary to elevated BNP. Discussed plan for admission with patient who agreed to plan of care. Discussed case with Dr. Tyler Pita - patient to be admitted to Telemetry for observation. Patient stable for transfer.   Jamse Mead, PA-C 05/08/14 1723

## 2014-05-08 NOTE — ED Notes (Signed)
Pt assisted to bathroom using Stedy.  Pt denied dizziness with standing.

## 2014-05-09 DIAGNOSIS — E119 Type 2 diabetes mellitus without complications: Secondary | ICD-10-CM

## 2014-05-09 LAB — BASIC METABOLIC PANEL
BUN: 16 mg/dL (ref 6–23)
CALCIUM: 8.8 mg/dL (ref 8.4–10.5)
CO2: 25 mEq/L (ref 19–32)
Chloride: 108 mEq/L (ref 96–112)
Creatinine, Ser: 0.65 mg/dL (ref 0.50–1.10)
GFR calc Af Amer: 86 mL/min — ABNORMAL LOW (ref >90)
GFR, EST NON AFRICAN AMERICAN: 74 mL/min — AB (ref >90)
Glucose, Bld: 149 mg/dL — ABNORMAL HIGH (ref 70–99)
POTASSIUM: 4.3 meq/L (ref 3.7–5.3)
SODIUM: 144 meq/L (ref 137–147)

## 2014-05-09 LAB — CBC
HCT: 39.5 % (ref 36.0–46.0)
Hemoglobin: 13 g/dL (ref 12.0–15.0)
MCH: 30.4 pg (ref 26.0–34.0)
MCHC: 32.9 g/dL (ref 30.0–36.0)
MCV: 92.3 fL (ref 78.0–100.0)
Platelets: 138 10*3/uL — ABNORMAL LOW (ref 150–400)
RBC: 4.28 MIL/uL (ref 3.87–5.11)
RDW: 13 % (ref 11.5–15.5)
WBC: 8.4 10*3/uL (ref 4.0–10.5)

## 2014-05-09 LAB — PROTIME-INR
INR: 1.87 — ABNORMAL HIGH (ref 0.00–1.49)
Prothrombin Time: 21 seconds — ABNORMAL HIGH (ref 11.6–15.2)

## 2014-05-09 LAB — FOLATE: Folate: >20 ng/mL

## 2014-05-09 LAB — TSH: TSH: 1.36 u[IU]/mL (ref 0.350–4.500)

## 2014-05-09 LAB — TROPONIN I: Troponin I: <0.3 ng/mL (ref <0.30)

## 2014-05-09 LAB — VITAMIN B12: Vitamin B-12: 645 pg/mL (ref 211–911)

## 2014-05-09 LAB — CORTISOL: Cortisol, Plasma: 0.9 ug/dL

## 2014-05-09 MED ORDER — WARFARIN SODIUM 4 MG PO TABS
4.0000 mg | ORAL_TABLET | Freq: Once | ORAL | Status: AC
  Administered 2014-05-09: 4 mg via ORAL
  Filled 2014-05-09: qty 1

## 2014-05-09 MED ORDER — METOPROLOL SUCCINATE ER 50 MG PO TB24
75.0000 mg | ORAL_TABLET | Freq: Every day | ORAL | Status: DC
  Administered 2014-05-10: 75 mg via ORAL
  Filled 2014-05-09 (×2): qty 1

## 2014-05-09 MED ORDER — LABETALOL HCL 5 MG/ML IV SOLN
10.0000 mg | Freq: Four times a day (QID) | INTRAVENOUS | Status: DC | PRN
  Filled 2014-05-09: qty 4

## 2014-05-09 MED ORDER — METOPROLOL TARTRATE 1 MG/ML IV SOLN
INTRAVENOUS | Status: AC
Start: 2014-05-09 — End: 2014-05-10
  Filled 2014-05-09: qty 5

## 2014-05-09 MED ORDER — METOPROLOL SUCCINATE ER 25 MG PO TB24
25.0000 mg | ORAL_TABLET | Freq: Every day | ORAL | Status: DC
  Filled 2014-05-09: qty 1

## 2014-05-09 MED ORDER — METOPROLOL SUCCINATE ER 50 MG PO TB24
50.0000 mg | ORAL_TABLET | Freq: Every day | ORAL | Status: DC
  Filled 2014-05-09: qty 1

## 2014-05-09 MED ORDER — COSYNTROPIN 0.25 MG IJ SOLR
0.2500 mg | Freq: Once | INTRAMUSCULAR | Status: AC
Start: 2014-05-09 — End: 2014-05-09
  Administered 2014-05-09: 0.25 mg via INTRAVENOUS
  Filled 2014-05-09 (×2): qty 0.25

## 2014-05-09 MED ORDER — METOPROLOL TARTRATE 1 MG/ML IV SOLN
5.0000 mg | Freq: Once | INTRAVENOUS | Status: AC
  Administered 2014-05-09: 5 mg via INTRAVENOUS

## 2014-05-09 NOTE — Progress Notes (Signed)
Nutrition Brief Note  Patient identified on the Malnutrition Screening Tool (MST) Report  Wt Readings from Last 15 Encounters:  05/08/14 162 lb 0.6 oz (73.5 kg)  11/11/11 184 lb 4.9 oz (83.6 kg)  07/20/07 182 lb 4 oz (82.668 kg)  05/21/07 182 lb 4 oz (82.668 kg)    Body mass index is 29.63 kg/(m^2). Patient meets criteria for Overweight based on current BMI.   Current diet order is Heart healthy, patient is consuming approximately >75% of meals at this time. Labs and medications reviewed.   Pt's daughter reported pt with good appetite and stable weight pta. Eats three meals/day, and follows up regularly at PCP. Daughter request "egg allergy" to be removed from [employer] as pt is allergic to whole eggs, not foods baked with eggs (such as pancakes, waffles etc)  No nutrition interventions warranted at this time. If nutrition issues arise, please consult RD.   Atlee Abide MS RD LDN Clinical Dietitian [phone]

## 2014-05-09 NOTE — Progress Notes (Signed)
ANTICOAGULATION CONSULT NOTE - Follow Up Consult  Pharmacy Consult for Warfarin Indication: atrial fibrillation  Allergies  Allergen Reactions  . Amoxicillin     "Hard on stomach"  . Cephalosporins     "hard on stomach"  . Codeine     Unsure of reaction, may have been rash or may have been confusion  . Eggs Or Egg-Derived Products Swelling    Whole body swelling can't have raw eggs  . Iodine     Unknown reaction   . Shellfish Allergy Swelling    Whole body swells   . Adhesive [Tape] Rash    Sensitive to bandages    Patient Measurements: Height: 5\' 2"  (157.5 cm) Weight: 162 lb 0.6 oz (73.5 kg) IBW/kg (Calculated) : 50.1  Vital Signs: Temp: 97.9 F (36.6 C) (06/08 0543) Temp src: Oral (06/08 0543) BP: 119/72 mmHg (06/08 0550) Pulse Rate: 72 (06/08 0550)  Labs:  Recent Labs  05/08/14 1020 05/08/14 1230  05/08/14 1824 05/09/14 0038 05/09/14 0513  HGB 14.5  --   --   --   --  13.0  HCT 42.8  --   --   --   --  39.5  PLT 173  --   --   --   --  138*  LABPROT  --  19.6*  --   --   --  21.0*  INR  --  1.71*  --   --   --  1.87*  CREATININE 0.64  --   --   --   --  0.65  TROPONINI <0.30  --   < > <0.30 <0.30 <0.30  < > = values in this interval not displayed.  Estimated Creatinine Clearance: 41.3 ml/min (by C-G formula based on Cr of 0.65).   Medications:  Scheduled:  . acetaminophen  1,000 mg Oral QID  . aspirin EC  81 mg Oral Daily  . dorzolamide-timolol  1 drop Left Eye BID  . isosorbide mononitrate  30 mg Oral Daily  . latanoprost  1 drop Left Eye QHS  . levothyroxine  112 mcg Oral QAC breakfast  . metoprolol succinate  50 mg Oral QPC breakfast  . multivitamin-lutein  1 capsule Oral Daily  . simvastatin  40 mg Oral q morning - 10a  . sodium chloride  3 mL Intravenous Q12H  . vitamin B-12  100 mcg Oral Daily  . Warfarin - Pharmacist Dosing Inpatient   Does not apply q1800   Infusions:  . sodium chloride 75 mL/hr at 05/09/14 0932    Assessment: 80  yo F with Hx of atrial fibrillation on chronic anticoagulation with warfarin.  Patient presented to ER on 6/7 with near syncope.  Pharmacy consulted to continue warfarin dosing inpatient.    PTA warfarin dose 4mg  daily except 2 mg on Mondays.  INR 1.7, subtherapeutic on admission.    INR 1.87, remains slightly subtherapeutic, but increased.  CBC stable: Hgb 13 and Plt 138  Goal of Therapy:  INR 2-3 Monitor platelets by anticoagulation protocol: Yes   Plan:   Warfarin 4mg  PO today at 1800  Daily INR   Gretta Arab PharmD, BCPS Pager [phone] 05/09/2014 8:27 AM

## 2014-05-09 NOTE — Progress Notes (Signed)
Patient ID: Kathleen Mccarty  female  [id number]    DOB: September 05, 1918    DOA: 05/08/2014  PCP: Kathleen Mccarty  Assessment/Plan: Principal Problem:   Near syncope: Orthostatic positive - Still somewhat orthostatic today, troponins negative - B12, folate within normal limits however cortisol very low at 0.9 - Ordered cosyntropin stimulation test. Discussed in detail with the patient's daughter who reported that patient had received a steroid injection in her left hip a week ago.  - TSH is within normal limits - Continue Imdur, Toprol-XL, hold Lasix  Active Problems:   A-fib - Currently controlled, on Coumadin    DM type 2 (diabetes mellitus, type 2) - Continue sliding scale insulin  CAD (coronary artery disease)  -Currently stable, continue beta blocker, Imdur, nitroglycerin as needed, statin, Coumadin   HTN (hypertension)  - Currently orthostatics positive, as #1   Hypothyroidism  - Check TSH continue Synthroid   Osteoarthritis- left hip  - PTOT evaluation recommended home health PT   DVT Prophylaxis: Lovenox  Code Status: DNR/DNI  Family Communication: Discussed in detail with the patient's daughter at the bedside  Disposition:  Consultants:  None  Procedures:  None  Antibiotics:  None    Subjective: Patient seen and examined, feels much better today, ambulating, slightly orthostatic positive still  Objective: Weight change:   Intake/Output Summary (Last 24 hours) at 05/09/14 1447 Last data filed at 05/09/14 1416  Gross per 24 hour  Intake   1320 ml  Output    275 ml  Net   1045 ml   Blood pressure 115/71, pulse 79, temperature 98 F (36.7 C), temperature source Oral, resp. rate 18, height 5\' 2"  (1.575 m), weight 73.5 kg (162 lb 0.6 oz), SpO2 95.00%.  Physical Exam: General: Alert and awake, oriented x3, not in any acute distress. CVS: S1-S2 clear, 2/6 SEM Chest: clear to auscultation bilaterally, no wheezing, rales or rhonchi Abdomen:  soft nontender, nondistended, normal bowel sounds  Extremities: no cyanosis, clubbing or edema noted bilaterally Neuro: Cranial nerves II-XII intact, no focal neurological deficits  Lab Results: Basic Metabolic Panel:  Recent Labs Lab 05/08/14 1020 05/09/14 0513  NA 143 144  K 4.0 4.3  CL 103 108  CO2 26 25  GLUCOSE 139* 149*  BUN 18 16  CREATININE 0.64 0.65  CALCIUM 9.3 8.8   Liver Function Tests:  Recent Labs Lab 05/08/14 1020  AST 16  ALT 14  ALKPHOS 71  BILITOT 0.5  PROT 6.7  ALBUMIN 3.6   No results found for this basename: LIPASE, AMYLASE,  in the last 168 hours No results found for this basename: AMMONIA,  in the last 168 hours CBC:  Recent Labs Lab 05/08/14 1020 05/09/14 0513  WBC 10.9* 8.4  NEUTROABS 8.2*  --   HGB 14.5 13.0  HCT 42.8 39.5  MCV 91.5 92.3  PLT 173 138*   Cardiac Enzymes:  Recent Labs Lab 05/08/14 1824 05/09/14 0038 05/09/14 0513  TROPONINI <0.30 <0.30 <0.30   BNP: No components found with this basename: POCBNP,  CBG: No results found for this basename: GLUCAP,  in the last 168 hours   Micro Results: No results found for this or any previous visit (from the past 240 hour(s)).  Studies/Results: Dg Chest 2 View  05/08/2014   CLINICAL DATA:  Presyncope. Lightheadedness. Weakness. Atrial fibrillation. Hypertension.  EXAM: CHEST  2 VIEW  COMPARISON:  02/18/12  FINDINGS: Mild cardiomegaly is stable. Both lungs are clear. No evidence of pleural effusion. No mass  or lymphadenopathy identified. Ectasia thoracic aorta is stable.  IMPRESSION: Stable mild cardiomegaly.  No active lung disease.   Electronically Signed   By: Earle Gell M.D.   On: 05/08/2014 14:03    Medications: Scheduled Meds: . acetaminophen  1,000 mg Oral QID  . aspirin EC  81 mg Oral Daily  . dorzolamide-timolol  1 drop Left Eye BID  . isosorbide mononitrate  30 mg Oral Daily  . latanoprost  1 drop Left Eye QHS  . levothyroxine  112 mcg Oral QAC breakfast  .  [START ON 05/10/2014] metoprolol succinate  25 mg Oral QPC breakfast  . multivitamin-lutein  1 capsule Oral Daily  . simvastatin  40 mg Oral q morning - 10a  . sodium chloride  3 mL Intravenous Q12H  . vitamin B-12  100 mcg Oral Daily  . warfarin  4 mg Oral ONCE-1800  . Warfarin - Pharmacist Dosing Inpatient   Does not apply q1800      LOS: 1 day   Kathleen Mccarty M.D. [facility] 05/09/2014, 2:47 PM Pager: [phone]  If 7PM-7AM, please contact night-coverage [email or web address] Password [email or web address]  **Disclaimer: This note was dictated with voice recognition software. Similar sounding words can inadvertently be transcribed and this note may contain transcription errors which may not have been corrected upon publication of note.**

## 2014-05-09 NOTE — ED Provider Notes (Signed)
Medical screening examination/treatment/procedure(s) were conducted as a shared visit with non-physician practitioner(s) and myself.  I personally evaluated the patient during the encounter.   EKG Interpretation   Date/Time:  Sunday May 08 2014 12:02:12 EDT Ventricular Rate:  87 PR Interval:    QRS Duration: 136 QT Interval:  396 QTC Calculation: 476 R Axis:   -17 Text Interpretation:  Atrial fibrillation Left bundle branch block Left  bundle branch block seen on one prior EKG Confirmed by Chase Arnall  MD,  Loma Sousa ([id number]) on 05/08/2014 12:12:20 PM      Patient presents with generalized weakness and presyncope.  She states that  "felt faint and tired." She is nontoxic and nonfocal on exam. Orthostatics are positive and she appears dehydrated. Patient was given fluids. Other workup is reassuring.  Given age and need for gentle rehydration, will admit for observation.  Merryl Hacker, MD 05/09/14 [phone]

## 2014-05-09 NOTE — Progress Notes (Signed)
UR completed 

## 2014-05-09 NOTE — Progress Notes (Signed)
Patient called stated that she feels funny, has discomfort in her abdomen,  no complaints of any chest pain, v/s checked  BP R arm 159/115, left arm 166 /110.  MD made aware  gave orders. MD  came to assessed patient.  Stat EKG done, placed on O2 1L. Patient  On bed alert and oriented,resting.

## 2014-05-09 NOTE — Evaluation (Signed)
Occupational Therapy Evaluation Patient Details Name: Kathleen Mccarty MRN: [id number] DOB: 10-02-1918 Today's Date: 05/09/2014    History of Present Illness Patient is a 80 year old female with past medical history of hypertension, hyperlipidemia, atrial fibrillation on Coumadin, thyroid cancer with thyroidectomy, osteoarthritis, worse in left hip presented to the ED with dizziness and weakness since morning   Clinical Impression   Pt presents to OT with decreased I with ADL activity due to problems listed below. Pt will benefit from skilled OT to increase I and return to PLOF    Follow Up Recommendations  Home health OT;Supervision/Assistance - 24 hour    Equipment Recommendations  None recommended by OT    Recommendations for Other Services       Precautions / Restrictions Precautions Precautions: Fall      Mobility Bed Mobility Overal bed mobility: Needs Assistance Bed Mobility: Supine to Sit     Supine to sit: Supervision        Transfers Overall transfer level: Needs assistance Equipment used: Rolling walker (2 wheeled) Transfers: Sit to/from Stand Sit to Stand: Min guard                   ADL Overall ADL's : Needs assistance/impaired                 Upper Body Dressing : Set up   Lower Body Dressing: Minimal assistance;Sit to/from stand   Toilet Transfer: Minimal assistance;RW;Ambulation   Toileting- Clothing Manipulation and Hygiene: Minimal assistance;Sit to/from stand       Functional mobility during ADLs: Minimal assistance General ADL Comments: Pt has caregivers  24/7. Educated pt on importance of sitting after laying for a minute- then standing for some time before walking as orthostatics have been an issie. Pt and daughter verbalized understanding            Hand Dominance     Extremity/Trunk Assessment Upper Extremity Assessment Upper Extremity Assessment: Generalized weakness           Communication  Communication Communication: No difficulties   Cognition Arousal/Alertness: Awake/alert Behavior During Therapy: WFL for tasks assessed/performed Overall Cognitive Status: Within Functional Limits for tasks assessed                                Home Living Family/patient expects to be discharged to:: Private residence Living Arrangements: Alone Available Help at Discharge: Personal care attendant;Available 24 hours/day Type of Home: House Home Access: Stairs to enter [employer] of Steps: 3 Entrance Stairs-Rails: Right Home Layout: One level     Bathroom Shower/Tub: Tub/shower unit         Home Equipment: Walker - 2 wheels;Grab bars - tub/shower;Hand held shower head;Shower seat;Cane - single point;Transport chair          Prior Functioning/Environment Level of Independence: Needs assistance    ADL's / Homemaking Assistance Needed: min A- pt has home instead 24/7        OT Diagnosis: Generalized weakness   OT Problem List: Decreased strength;Decreased activity tolerance   OT Treatment/Interventions: Self-care/ADL training;Patient/family education    OT Goals(Current goals can be found in the care plan section) Acute Rehab OT Goals Patient Stated Goal: i enjoy exercises and going out for a walk OT Goal Formulation: With patient Time For Goal Achievement: 05/23/14 Potential to Achieve Goals: Good  OT Frequency: Min 2X/week   Barriers to D/C:  End of Session Equipment Utilized During Treatment: Rolling walker  Activity Tolerance: Patient tolerated treatment well Patient left: in bed   Time: 1220-1241 OT Time Calculation (min): 21 min Charges:  OT General Charges $OT Visit: 1 Procedure OT Evaluation $Initial OT Evaluation Tier I: 1 Procedure OT Treatments $Self Care/Home Management : 8-22 mins G-Codes:    Betsy Pries 05-20-14, 12:56 PM

## 2014-05-10 DIAGNOSIS — E2749 Other adrenocortical insufficiency: Secondary | ICD-10-CM | POA: Diagnosis present

## 2014-05-10 DIAGNOSIS — J309 Allergic rhinitis, unspecified: Secondary | ICD-10-CM

## 2014-05-10 LAB — PROTIME-INR
INR: 2.06 — ABNORMAL HIGH (ref 0.00–1.49)
Prothrombin Time: 22.6 seconds — ABNORMAL HIGH (ref 11.6–15.2)

## 2014-05-10 LAB — ACTH STIMULATION, 3 TIME POINTS
Cortisol, 30 Min: 15.4 ug/dL — ABNORMAL LOW (ref >20.0)
Cortisol, 60 Min: 16.8 ug/dL — ABNORMAL LOW (ref >20)
Cortisol, Base: 1.5 ug/dL

## 2014-05-10 MED ORDER — FUROSEMIDE 20 MG PO TABS: 20.0000 mg | ORAL_TABLET | ORAL | Status: DC | PRN

## 2014-05-10 MED ORDER — ISOSORBIDE MONONITRATE ER 60 MG PO TB24: 30.0000 mg | ORAL_TABLET | Freq: Every evening | ORAL | Status: DC

## 2014-05-10 MED ORDER — WARFARIN SODIUM 4 MG PO TABS
4.0000 mg | ORAL_TABLET | Freq: Once | ORAL | Status: DC
  Filled 2014-05-10: qty 1

## 2014-05-10 MED ORDER — METOPROLOL SUCCINATE ER 50 MG PO TB24: 50.0000 mg | ORAL_TABLET | Freq: Every day | ORAL | Status: AC

## 2014-05-10 NOTE — Evaluation (Signed)
Physical Therapy Evaluation Patient Details Name: Kathleen Mccarty MRN: [id number] DOB: 1917/12/25 Today's Date: 05/10/2014   History of Present Illness  Patient is a 80 year old female with past medical history of hypertension, hyperlipidemia, atrial fibrillation on Coumadin, thyroid cancer with thyroidectomy, osteoarthritis, worse in left hip admitted 05/08/14 with dizziness and weakness.  Clinical Impression  Pt admitted with above. Pt currently with functional limitations due to the deficits listed below (see PT Problem List).  Pt will benefit from skilled PT to increase their independence and safety with mobility to allow discharge to the venue listed below.  Pt doing well with mobility and reports 24/7 assist at home.  Pt states her RW was her husbands and is very heavy, so she is agreeable to new RW if insurance can cover this.     Follow Up Recommendations Home health PT (home safety eval)    Equipment Recommendations  Rolling walker with 5" wheels    Recommendations for Other Services       Precautions / Restrictions Precautions Precautions: Fall      Mobility  Bed Mobility Overal bed mobility: Needs Assistance Bed Mobility: Supine to Sit     Supine to sit: Supervision     General bed mobility comments: sitting EOB with OT on arrival  Transfers Overall transfer level: Needs assistance Equipment used: Rolling walker (2 wheeled) Transfers: Sit to/from Stand Sit to Stand: Supervision            Ambulation/Gait Ambulation/Gait assistance: Supervision Ambulation Distance (Feet): 280 Feet Assistive device: Rolling walker (2 wheeled) Gait Pattern/deviations: Step-through pattern;Trunk flexed Gait velocity: decr   General Gait Details: verbal cues for use of RW, denies dizziness, HR 95-105 during ambulation  Stairs            Wheelchair Mobility    Modified Rankin (Stroke Patients Only)       Balance                                             Pertinent Vitals/Pain No pain reported, denies dizziness throughout mobility    Home Living Family/patient expects to be discharged to:: Private residence Living Arrangements: Alone Available Help at Discharge: Personal care attendant;Available 24 hours/day Type of Home: House Home Access: Stairs to enter Entrance Stairs-Rails: Right Entrance Stairs-Number of Steps: 3 Home Layout: One level Home Equipment: Walker - 2 wheels;Grab bars - tub/shower;Hand held shower head;Shower seat;Cane - single point;Transport chair      Prior Function Level of Independence: Needs assistance      ADL's / Homemaking Assistance Needed: min A- pt has home instead 24/7        Hand Dominance        Extremity/Trunk Assessment               Lower Extremity Assessment: Generalized weakness      Cervical / Trunk Assessment: Kyphotic  Communication   Communication: No difficulties  Cognition Arousal/Alertness: Awake/alert Behavior During Therapy: WFL for tasks assessed/performed Overall Cognitive Status: Within Functional Limits for tasks assessed                      General Comments      Exercises        Assessment/Plan    PT Assessment Patient needs continued PT services  PT Diagnosis Difficulty walking   PT Problem List Decreased strength;Decreased  mobility  PT Treatment Interventions DME instruction;Gait training;Functional mobility training;Stair training;Therapeutic activities;Therapeutic exercise;Patient/family education   PT Goals (Current goals can be found in the Care Plan section) Acute Rehab PT Goals PT Goal Formulation: With patient Time For Goal Achievement: 05/17/14 Potential to Achieve Goals: Good    Frequency Min 3X/week   Barriers to discharge        Co-evaluation               End of Session   Activity Tolerance: Patient tolerated treatment well Patient left: in bed;with call bell/phone within reach;with bed  alarm set;with family/visitor present      Functional Assessment Tool Used: clinical judgement Functional Limitation: Mobility: Walking and moving around Mobility: Walking and Moving Around Current Status (G6269): At least 1 percent but less than 20 percent impaired, limited or restricted Mobility: Walking and Moving Around Goal Status [phone]): 0 percent impaired, limited or restricted    Time: 1050-1101 PT Time Calculation (min): 11 min   Charges:   PT Evaluation $Initial PT Evaluation Tier I: 1 Procedure PT Treatments $Gait Training: 8-22 mins   PT G Codes:   Functional Assessment Tool Used: clinical judgement Functional Limitation: Mobility: Walking and moving around    [employer] 05/10/2014, 12:16 PM Carmelia Bake, PT, DPT 05/10/2014 Pager: [phone]

## 2014-05-10 NOTE — Progress Notes (Signed)
CARE MANAGEMENT NOTE 05/10/2014  Patient:  Kathleen Mccarty, Kathleen Mccarty   Account Number:  [id number]  Date Initiated:  05/10/2014  Documentation initiated by:  Gabriel Earing  Subjective/Objective Assessment:   Patient is a 80 year old female admitted with pain  in left hip presented to the ED     Action/Plan:   from home   Anticipated DC Date:  05/10/2014   Anticipated DC Plan:  [place]  CM consult      [place]   Choice offered to / List presented to:  C-1 Patient   DME arranged  [facility]assie Moselle      DME agency  [place] arranged  HH-1 RN  [place]      [facility] agency  [place]   Status of service:  In process, will continue to follow Medicare Important Message given?  NA - LOS <3 / Initial given by admissions (If response is "NO", the following Medicare IM given date fields will be blank) Date Medicare IM given:   Date Additional Medicare IM given:    Discharge Disposition:  [place]  Per UR Regulation:  Reviewed for med. necessity/level of care/duration of stay  If discussed at [place] of Stay Meetings, dates discussed:    Comments:  05/10/14 [facility], RN, BSN Pt selected [place] for [place] [phone] needs referral faxed [phone] and called to office.

## 2014-05-10 NOTE — Progress Notes (Signed)
ANTICOAGULATION CONSULT NOTE - Follow Up Consult  Pharmacy Consult for Warfarin Indication: atrial fibrillation  Allergies  Allergen Reactions  . Amoxicillin     "Hard on stomach"  . Cephalosporins     "hard on stomach"  . Codeine     Unsure of reaction, may have been rash or may have been confusion  . Eggs Or Egg-Derived Products Swelling    Whole body swelling can't have raw eggs  . Iodine     Unknown reaction   . Shellfish Allergy Swelling    Whole body swells   . Adhesive [Tape] Rash    Sensitive to bandages    Patient Measurements: Height: 5\' 2"  (157.5 cm) Weight: 162 lb 0.6 oz (73.5 kg) IBW/kg (Calculated) : 50.1  Vital Signs: Temp: 98 F (36.7 C) (06/09 0528) Temp src: Oral (06/09 0528) BP: 139/97 mmHg (06/09 0528) Pulse Rate: 84 (06/09 0528)  Labs:  Recent Labs  05/08/14 1020 05/08/14 1230  05/08/14 1824 05/09/14 0038 05/09/14 0513 05/10/14 0408  HGB 14.5  --   --   --   --  13.0  --   HCT 42.8  --   --   --   --  39.5  --   PLT 173  --   --   --   --  138*  --   LABPROT  --  19.6*  --   --   --  21.0* 22.6*  INR  --  1.71*  --   --   --  1.87* 2.06*  CREATININE 0.64  --   --   --   --  0.65  --   TROPONINI <0.30  --   < > <0.30 <0.30 <0.30  --   < > = values in this interval not displayed.  Estimated Creatinine Clearance: 41.3 ml/min (by C-G formula based on Cr of 0.65).   Medications:  Scheduled:  . acetaminophen  1,000 mg Oral QID  . aspirin EC  81 mg Oral Daily  . dorzolamide-timolol  1 drop Left Eye BID  . isosorbide mononitrate  30 mg Oral Daily  . latanoprost  1 drop Left Eye QHS  . levothyroxine  112 mcg Oral QAC breakfast  . metoprolol succinate  75 mg Oral QPC breakfast  . multivitamin-lutein  1 capsule Oral Daily  . simvastatin  40 mg Oral q morning - 10a  . sodium chloride  3 mL Intravenous Q12H  . vitamin B-12  100 mcg Oral Daily  . Warfarin - Pharmacist Dosing Inpatient   Does not apply q1800   Infusions:      Assessment: 80 yo F with Hx of atrial fibrillation on chronic anticoagulation with warfarin.  Patient presented to ER on 6/7 with near syncope.  Pharmacy consulted to continue warfarin dosing inpatient.    PTA warfarin dose 4mg  daily except 2 mg on Mondays.  INR 1.7, subtherapeutic on admission.    INR 2.06, therapeutic  CBC stable (6/8): Hgb 13 and Plt 138  Goal of Therapy:  INR 2-3 Monitor platelets by anticoagulation protocol: Yes   Plan:   Warfarin 4mg  PO today at 1800  Daily INR  If discharged, would recommend resuming home dose of warfarin dose 4mg  daily except 2 mg on Mondays.   Gretta Arab PharmD, BCPS Pager [phone] 05/10/2014 8:52 AM

## 2014-05-10 NOTE — Discharge Summary (Signed)
Physician Discharge Summary  Patient ID: Kathleen Mccarty MRN: [id number] DOB/AGE: [birth year] 80 y.o.  Admit date: 05/08/2014 Discharge date: 05/10/2014  Primary Care Physician:  HARPER,SCOTT  Discharge Diagnoses:    . Dehydration with orthostasis  . Near syncope . CAD (coronary artery disease) . A-fib . Hypothyroidism . DM type 2 (diabetes mellitus, type 2) . HTN (hypertension) . Osteoarthritis- left hip . Hypocortisolism  Consults:  Endocrinology, Dr. Elayne Snare via phone consultation   Recommendations for Outpatient Follow-up:  Please repeat cortisol level,+/- ACTH stim test after a month  Please note patient's medications have been changed due to positive orthostasis  Allergies:   Allergies  Allergen Reactions  . Amoxicillin     "Hard on stomach"  . Cephalosporins     "hard on stomach"  . Codeine     Unsure of reaction, may have been rash or may have been confusion  . Eggs Or Egg-Derived Products Swelling    Whole body swelling can't have raw eggs  . Iodine     Unknown reaction   . Shellfish Allergy Swelling    Whole body swells   . Adhesive [Tape] Rash    Sensitive to bandages     Discharge Medications:   Medication List         acetaminophen 500 MG tablet  Commonly known as:  TYLENOL  Take 1,000 mg by mouth 4 (four) times daily.     aspirin EC 81 MG tablet  Take 81 mg by mouth every evening.     dorzolamide-timolol 22.3-6.8 MG/ML ophthalmic solution  Commonly known as:  COSOPT  Place 1 drop into the left eye 2 (two) times daily.     furosemide 20 MG tablet  Commonly known as:  LASIX  Take 1 tablet (20 mg total) by mouth as needed for fluid or edema.     ICAPS MV Tabs  Take 2 tablets by mouth 2 (two) times daily.     isosorbide mononitrate 60 MG 24 hr tablet  Commonly known as:  IMDUR  Take 0.5 tablets (30 mg total) by mouth every evening.     levothyroxine 112 MCG tablet  Commonly known as:  SYNTHROID, LEVOTHROID  Take 112 mcg by  mouth daily before breakfast.     metoprolol succinate 50 MG 24 hr tablet  Commonly known as:  TOPROL-XL  Take 1 tablet (50 mg total) by mouth daily after breakfast. Take with or immediately following a meal.     nitroGLYCERIN 0.4 MG SL tablet  Commonly known as:  NITROSTAT  Place 0.4 mg under the tongue every 5 (five) minutes as needed. For chest pain     PRESCRIPTION MEDICATION  - Type of injection for hip pain given at doctors office  - Unknown name of injection     simvastatin 40 MG tablet  Commonly known as:  ZOCOR  Take 40 mg by mouth every morning.     Travoprost (BAK Free) 0.004 % Soln ophthalmic solution  Commonly known as:  TRAVATAN  Place 1 drop into the left eye at bedtime.     VITAMIN B-12 PO  Take 1 tablet by mouth daily before breakfast.     warfarin 4 MG tablet  Commonly known as:  COUMADIN  Take 2-4 mg by mouth every evening. Takes 2mg  on Monday's and 4mg  the other days         Brief H and P: For complete details please refer to admission H and P, but in briefPatient is a  80 year old female with past medical history of hypertension, hyperlipidemia, atrial fibrillation on Coumadin, thyroid cancer with thyroidectomy, osteoarthritis, worse in left hip presented to the ED with dizziness and weakness since morning. History was obtained from the patient and her daughter in the room. Patient was in her baseline state of health yesterday with no issues. Per patient, she was eating her breakfast when she suddenly felt weak and dizzy. The patient did not have any syncopal episode. Patient reported that she was "worked up" prior to this episode as her caregiver who had come to take care of her excused herself "being sick". Patient or her daughter who recommended to take her nitroglycerin, she took 3 tablets in the dizziness actually worsened. Patient continued to feel weak and dizzy even after resting hence she called EMS. She also noticed that she was belching a lot today.  Otherwise she denied any chest pain, shortness of breath, palpitations, any diaphoresis.She denied any headaches, focal weakness, numbness or tingling, any loss of vision   Hospital Course:   Near syncope: patient was noted to be orthostatic positive, she had been on Lasix at home. Patient was admitted to telemetry, rule out for acute ACS. She was placed on gentle hydration. I discussed with patient and daughter regarding heart murmur (known to patient) and possible cardiac cause of near syncope, they declined ECHO. Patient declined CT head/ MRI brain stating that she does not want any intervention at her age any ways even if there is worsening of heart valve issue or underlying CVA or neurological issue. TSH, B12, folate within normal limits however cortisol very low at 0.9,  ACTH stim test was also positive. Patient's daughter reported that patient had received a steroid injection in her left hip a week ago. I discussed in detail with the endocrinology, Dr. Dwyane Dee on the phone who stated that the ACTH stim test and cortisol level can be affected by the cortisone injection. Recommended to repeat the test in 4 weeks if patient is still having the symptoms.  I have decreased the doses of Imdur, Toprol-XL. Patient was recommended to use Lasix only on as needed for peripheral edema. Patient was educated about orthostatic hypotension, also provided with TED hoses. PT OT, home RN was arranged   A-fib  - Currently controlled, on Coumadin   DM type 2 (diabetes mellitus, type 2) :  stable   CAD (coronary artery disease)  -Currently stable, continue beta blocker, Imdur, nitroglycerin as needed, statin, Coumadin   HTN (hypertension) : On the lower side, hence Imdur and beta blocker doses have been decreased to half  Hypothyroidism :  TSH normal, continue Synthroid   Osteoarthritis- left hip  - PTOT evaluation recommended home health PT      Day of Discharge BP 111/63  Pulse 85  Temp(Src) 97.9 F  (36.6 C) (Oral)  Resp 20  Ht 5\' 2"  (1.575 m)  Wt 73.5 kg (162 lb 0.6 oz)  BMI 29.63 kg/m2  SpO2 96%  Physical Exam: General: Alert and awake oriented x3 not in any acute distress. CVS: S1-S2 clear no murmur rubs or gallops Chest: clear to auscultation bilaterally, no wheezing rales or rhonchi Abdomen: soft nontender, nondistended, normal bowel sounds Extremities: no cyanosis, clubbing or edema noted bilaterally    The results of significant diagnostics from this hospitalization (including imaging, microbiology, ancillary and laboratory) are listed below for reference.    LAB RESULTS: Basic Metabolic Panel:  Recent Labs Lab 05/08/14 1020 05/09/14 0513  NA 143 144  K 4.0 4.3  CL 103 108  CO2 26 25  GLUCOSE 139* 149*  BUN 18 16  CREATININE 0.64 0.65  CALCIUM 9.3 8.8   Liver Function Tests:  Recent Labs Lab 05/08/14 1020  AST 16  ALT 14  ALKPHOS 71  BILITOT 0.5  PROT 6.7  ALBUMIN 3.6   No results found for this basename: LIPASE, AMYLASE,  in the last 168 hours No results found for this basename: AMMONIA,  in the last 168 hours CBC:  Recent Labs Lab 05/08/14 1020 05/09/14 0513  WBC 10.9* 8.4  NEUTROABS 8.2*  --   HGB 14.5 13.0  HCT 42.8 39.5  MCV 91.5 92.3  PLT 173 138*   Cardiac Enzymes:  Recent Labs Lab 05/09/14 0038 05/09/14 0513  TROPONINI <0.30 <0.30   BNP: No components found with this basename: POCBNP,  CBG: No results found for this basename: GLUCAP,  in the last 168 hours  Significant Diagnostic Studies:  Dg Chest 2 View  05/08/2014   CLINICAL DATA:  Presyncope. Lightheadedness. Weakness. Atrial fibrillation. Hypertension.  EXAM: CHEST  2 VIEW  COMPARISON:  02/18/12  FINDINGS: Mild cardiomegaly is stable. Both lungs are clear. No evidence of pleural effusion. No mass or lymphadenopathy identified. Ectasia thoracic aorta is stable.  IMPRESSION: Stable mild cardiomegaly.  No active lung disease.   Electronically Signed   By: Earle Gell  M.D.   On: 05/08/2014 14:03     Disposition and Follow-up:     Discharge Instructions   Diet - low sodium heart healthy    Complete by:  As directed      Discharge instructions    Complete by:  As directed   Please note medication changes prior to starting your home medications. Toprol XL and Imdur has been decreased to half, you can split the pill and take it or take the new prescription.  Please take Lasix as needed only for swelling in the ankles/fluid   Please follow with you primary care physician for further adjustment in her medications.  Your primary care physician will need to repeat the cortisol test in one month     Increase activity slowly    Complete by:  As directed             DISPOSITION: Home   DIET:Carb modify diet     DISCHARGE FOLLOW-UP Follow-up Information   Follow up with HARPER,SCOTT On 05/13/2014. (for hospital follow-up at 11:15am)    Contact information:   [place] [address] [phone]       Time spent on Discharge: 45 minutes   Signed:   Mendel Corning M.D. [facility] 05/10/2014, 1:37 PM Pager: [phone]   **Disclaimer: This note was dictated with voice recognition software. Similar sounding words can inadvertently be transcribed and this note may contain transcription errors which may not have been corrected upon publication of note.**

## 2014-05-10 NOTE — Progress Notes (Signed)
Occupational Therapy Treatment Patient Details Name: Kathleen Mccarty MRN: [id number] DOB: May 29, 1918 Today's Date: 05/10/2014    History of present illness Patient is a 80 year old female with past medical history of hypertension, hyperlipidemia, atrial fibrillation on Coumadin, thyroid cancer with thyroidectomy, osteoarthritis, worse in left hip presented to the ED with dizziness and weakness since morning      Follow Up Recommendations  Home health OT;Supervision/Assistance - 24 hour    Equipment Recommendations  None recommended by OT       Precautions / Restrictions Precautions Precautions: Fall       Mobility Bed Mobility Overal bed mobility: Needs Assistance Bed Mobility: Supine to Sit     Supine to sit: Supervision        Transfers Overall transfer level: Needs assistance Equipment used: Rolling walker (2 wheeled) Transfers: Sit to/from Stand Sit to Stand: Supervision                  ADL                           Toilet Transfer: Supervision/safety;RW;Ambulation   Toileting- Water quality scientist and Hygiene: Supervision/safety;Sit to/from stand         General ADL Comments: no dizziness reported                Cognition   Behavior During Therapy: WFL for tasks assessed/performed Overall Cognitive Status: Within Functional Limits for tasks assessed                               General Comments       Prior Functioning/Environment              Frequency Min 2X/week     Progress Toward Goals  OT Goals(current goals can now be found in the care plan section)  Progress towards OT goals: Progressing toward goals     Plan Discharge plan remains appropriate    Co-evaluation                 End of Session Equipment Utilized During Treatment: Rolling walker   Activity Tolerance Patient tolerated treatment well   Patient Left in bed;with call bell/phone within reach;with family/visitor  present   Nurse Communication      Functional Assessment Tool Used: clinical observation Functional Limitation: Self care Self Care Current Status (E4235): At least 20 percent but less than 40 percent impaired, limited or restricted Self Care Goal Status (T6144): At least 20 percent but less than 40 percent impaired, limited or restricted   Time: 1039-1051 OT Time Calculation (min): 12 min  Charges: OT G-codes **NOT FOR INPATIENT CLASS** Functional Assessment Tool Used: clinical observation Functional Limitation: Self care Self Care Current Status (R1540): At least 20 percent but less than 40 percent impaired, limited or restricted Self Care Goal Status (G8676): At least 20 percent but less than 40 percent impaired, limited or restricted OT General Charges $OT Visit: 1 Procedure OT Treatments $Self Care/Home Management : 8-22 mins  Betsy Pries 05/10/2014, 10:59 AM

## 2014-05-12 ENCOUNTER — Encounter (HOSPITAL_COMMUNITY): Payer: Self-pay | Admitting: Emergency Medicine

## 2014-05-12 ENCOUNTER — Emergency Department (HOSPITAL_COMMUNITY): Payer: Medicare HMO

## 2014-05-12 ENCOUNTER — Emergency Department (HOSPITAL_COMMUNITY)
Admission: EM | Admit: 2014-05-12 | Discharge: 2014-05-12 | Disposition: A | Discharge status: Home / Self Care | Payer: Medicare HMO | Attending: Emergency Medicine | Admitting: Emergency Medicine

## 2014-05-12 DIAGNOSIS — R011 Cardiac murmur, unspecified: Secondary | ICD-10-CM | POA: Insufficient documentation

## 2014-05-12 DIAGNOSIS — R55 Syncope and collapse: Secondary | ICD-10-CM | POA: Insufficient documentation

## 2014-05-12 DIAGNOSIS — Z79899 Other long term (current) drug therapy: Secondary | ICD-10-CM | POA: Insufficient documentation

## 2014-05-12 DIAGNOSIS — Z7901 Long term (current) use of anticoagulants: Secondary | ICD-10-CM | POA: Insufficient documentation

## 2014-05-12 DIAGNOSIS — E785 Hyperlipidemia, unspecified: Secondary | ICD-10-CM | POA: Insufficient documentation

## 2014-05-12 DIAGNOSIS — M129 Arthropathy, unspecified: Secondary | ICD-10-CM | POA: Insufficient documentation

## 2014-05-12 DIAGNOSIS — I10 Essential (primary) hypertension: Secondary | ICD-10-CM | POA: Insufficient documentation

## 2014-05-12 DIAGNOSIS — R42 Dizziness and giddiness: Secondary | ICD-10-CM | POA: Insufficient documentation

## 2014-05-12 DIAGNOSIS — Z859 Personal history of malignant neoplasm, unspecified: Secondary | ICD-10-CM | POA: Insufficient documentation

## 2014-05-12 DIAGNOSIS — I4891 Unspecified atrial fibrillation: Secondary | ICD-10-CM | POA: Insufficient documentation

## 2014-05-12 DIAGNOSIS — Z880 Allergy status to penicillin: Secondary | ICD-10-CM | POA: Insufficient documentation

## 2014-05-12 DIAGNOSIS — Z7982 Long term (current) use of aspirin: Secondary | ICD-10-CM | POA: Insufficient documentation

## 2014-05-12 LAB — TROPONIN I: Troponin I: <0.3 ng/mL (ref <0.30)

## 2014-05-12 LAB — URINALYSIS, ROUTINE W REFLEX MICROSCOPIC
BILIRUBIN URINE: NEGATIVE
Glucose, UA: NEGATIVE mg/dL
Hgb urine dipstick: NEGATIVE
Ketones, ur: NEGATIVE mg/dL
LEUKOCYTES UA: NEGATIVE
NITRITE: NEGATIVE
Protein, ur: NEGATIVE mg/dL
SPECIFIC GRAVITY, URINE: 1.015 (ref 1.005–1.030)
UROBILINOGEN UA: 0.2 mg/dL (ref 0.0–1.0)
pH: 7.5 (ref 5.0–8.0)

## 2014-05-12 LAB — CBC WITH DIFFERENTIAL/PLATELET
BASOS PCT: 1 % (ref 0–1)
Basophils Absolute: 0 10*3/uL (ref 0.0–0.1)
EOS ABS: 0.1 10*3/uL (ref 0.0–0.7)
Eosinophils Relative: 2 % (ref 0–5)
HCT: 44 % (ref 36.0–46.0)
Hemoglobin: 14.8 g/dL (ref 12.0–15.0)
Lymphocytes Relative: 16 % (ref 12–46)
Lymphs Abs: 1.4 10*3/uL (ref 0.7–4.0)
MCH: 30.8 pg (ref 26.0–34.0)
MCHC: 33.6 g/dL (ref 30.0–36.0)
MCV: 91.5 fL (ref 78.0–100.0)
Monocytes Absolute: 0.5 10*3/uL (ref 0.1–1.0)
Monocytes Relative: 6 % (ref 3–12)
NEUTROS ABS: 6.5 10*3/uL (ref 1.7–7.7)
Neutrophils Relative %: 75 % (ref 43–77)
PLATELETS: 158 10*3/uL (ref 150–400)
RBC: 4.81 MIL/uL (ref 3.87–5.11)
RDW: 12.9 % (ref 11.5–15.5)
WBC: 8.5 10*3/uL (ref 4.0–10.5)

## 2014-05-12 LAB — COMPREHENSIVE METABOLIC PANEL
ALBUMIN: 3.5 g/dL (ref 3.5–5.2)
ALK PHOS: 67 U/L (ref 39–117)
ALT: 18 U/L (ref 0–35)
AST: 19 U/L (ref 0–37)
BUN: 15 mg/dL (ref 6–23)
CO2: 27 mEq/L (ref 19–32)
Calcium: 9.3 mg/dL (ref 8.4–10.5)
Chloride: 107 mEq/L (ref 96–112)
Creatinine, Ser: 0.58 mg/dL (ref 0.50–1.10)
GFR calc Af Amer: 89 mL/min — ABNORMAL LOW (ref >90)
GFR calc non Af Amer: 77 mL/min — ABNORMAL LOW (ref >90)
GLUCOSE: 141 mg/dL — AB (ref 70–99)
POTASSIUM: 4.1 meq/L (ref 3.7–5.3)
Sodium: 146 mEq/L (ref 137–147)
TOTAL PROTEIN: 6.8 g/dL (ref 6.0–8.3)
Total Bilirubin: 0.5 mg/dL (ref 0.3–1.2)

## 2014-05-12 LAB — PROTIME-INR
INR: 2.22 — ABNORMAL HIGH (ref 0.00–1.49)
Prothrombin Time: 23.9 seconds — ABNORMAL HIGH (ref 11.6–15.2)

## 2014-05-12 LAB — PRO B NATRIURETIC PEPTIDE: PRO B NATRI PEPTIDE: 1235 pg/mL — AB (ref 0–450)

## 2014-05-12 MED ORDER — FUROSEMIDE 10 MG/ML IJ SOLN
40.0000 mg | Freq: Once | INTRAMUSCULAR | Status: AC
  Administered 2014-05-12: 40 mg via INTRAVENOUS
  Filled 2014-05-12: qty 4

## 2014-05-12 NOTE — ED Provider Notes (Signed)
CSN: [id number]     Arrival date & time 05/12/14  8527 History   First MD Initiated Contact with Patient 05/12/14 [phone]     Chief Complaint  Patient presents with  . Fatigue     (Consider location/radiation/quality/duration/timing/severity/associated sxs/prior Treatment) HPI Comments: Patient from home with episode of unresponsiveness while laying in bed. Patient's caregiver reports patient went to the bathroom around 7 AM and came back to bed. Right 8:00 the patient was difficult to arouse and not responding. She now feels back to baseline except she is lightheaded and generally weak. She denies any fever, chills, nausea, vomiting no abdominal pain or shortness of breath. She was admitted to the hospital a few days ago and discharged 2 days ago for generalized weakness and orthostatic hypotension. Patient denies any prodrome of vertigo or dizziness. She denies any chest pain or shortness of breath. She is a focal weakness, numbness or tingling. There is no tongue biting or urinary incontinence.  The history is provided by the patient and a relative.    Past Medical History  Diagnosis Date  . Dysrhythmia   . Cancer   . Arthritis   . History of thyroidectomy   . Hypertension   . Hyperlipidemia   . Atrial fibrillation     on coumadin   Past Surgical History  Procedure Laterality Date  . Breast surgery    . Abdominal hysterectomy     No family history on file. History  Substance Use Topics  . Smoking status: Never Smoker   . Smokeless tobacco: Never Used  . Alcohol Use: No   OB History   Grav Para Term Preterm Abortions TAB SAB Ect Mult Living                 Review of Systems  Constitutional: Positive for fatigue. Negative for fever, activity change and appetite change.  HENT: Negative for congestion.   Respiratory: Negative for cough, chest tightness and shortness of breath.   Cardiovascular: Negative for chest pain.  Gastrointestinal: Negative for nausea, vomiting and  abdominal pain.  Genitourinary: Negative for dysuria, vaginal bleeding and vaginal discharge.  Musculoskeletal: Negative for arthralgias and myalgias.  Skin: Negative for rash.  Neurological: Positive for dizziness and weakness.  A complete 10 system review of systems was obtained and all systems are negative except as noted in the HPI and PMH.      Allergies  Amoxicillin; Cephalosporins; Codeine; Eggs or egg-derived products; Iodine; Shellfish allergy; and Adhesive  Home Medications   Prior to Admission medications   Medication Sig Start Date End Date Taking? Authorizing Provider  acetaminophen (TYLENOL) 500 MG tablet Take 1,000 mg by mouth 4 (four) times daily.    Yes Historical Provider, MD  aspirin EC 81 MG tablet Take 81 mg by mouth every evening.   Yes Historical Provider, MD  Cyanocobalamin (VITAMIN B-12 PO) Take 1 tablet by mouth daily before breakfast.   Yes Historical Provider, MD  dorzolamide-timolol (COSOPT) 22.3-6.8 MG/ML ophthalmic solution Place 1 drop into the left eye 2 (two) times daily.    Yes Historical Provider, MD  furosemide (LASIX) 20 MG tablet Take 20 mg by mouth every morning. As needed for edema or fluid   Yes Historical Provider, MD  isosorbide mononitrate (IMDUR) 60 MG 24 hr tablet Take 0.5 tablets (30 mg total) by mouth every evening. 05/10/14  Yes Ripudeep Krystal Eaton, MD  levothyroxine (SYNTHROID, LEVOTHROID) 112 MCG tablet Take 112 mcg by mouth daily before breakfast.  Yes Historical Provider, MD  metoprolol succinate (TOPROL-XL) 50 MG 24 hr tablet Take 1 tablet (50 mg total) by mouth daily after breakfast. Take with or immediately following a meal. 05/10/14  Yes Ripudeep K Rai, MD  Multiple Vitamins-Minerals (ICAPS MV) TABS Take 2 tablets by mouth 2 (two) times daily.    Yes Historical Provider, MD  nitroGLYCERIN (NITROSTAT) 0.4 MG SL tablet Place 0.4 mg under the tongue every 5 (five) minutes as needed. For chest pain   Yes Historical Provider, MD  PRESCRIPTION  MEDICATION Type of injection for hip pain given at doctors office Unknown name of injection   Yes Historical Provider, MD  simvastatin (ZOCOR) 40 MG tablet Take 40 mg by mouth every morning.    Yes Historical Provider, MD  Travoprost, BAK Free, (TRAVATAN) 0.004 % SOLN ophthalmic solution Place 1 drop into the left eye at bedtime.     Yes Historical Provider, MD  warfarin (COUMADIN) 4 MG tablet Take 4 mg by mouth every evening.    Yes Historical Provider, MD   BP 132/87  Pulse 85  Temp(Src) 98 F (36.7 C) (Oral)  Resp 18  SpO2 99% Physical Exam  Constitutional: She is oriented to person, place, and time. She appears well-developed and well-nourished. No distress.  HENT:  Head: Normocephalic and atraumatic.  Mouth/Throat: Oropharynx is clear and moist. No oropharyngeal exudate.  Eyes: Conjunctivae and EOM are normal. Pupils are equal, round, and reactive to light.  Neck: Normal range of motion. Neck supple.  Cardiovascular: Normal rate.   Murmur heard. Irregular rhythm Systolic murmur present  Pulmonary/Chest: Effort normal and breath sounds normal.  Abdominal: Soft. There is no tenderness. There is no rebound and no guarding.  Musculoskeletal: Normal range of motion. She exhibits no edema and no tenderness.  Neurological: She is alert and oriented to person, place, and time. No cranial nerve deficit. She exhibits normal muscle tone. Coordination normal.  CN 2-12 intact, no ataxia on finger to nose, no nystagmus, 5/5 strength throughout, no pronator drift, Romberg negative, normal gait.   Skin: Skin is warm.    ED Course  Procedures (including critical care time) Labs Review Labs Reviewed  COMPREHENSIVE METABOLIC PANEL - Abnormal; Notable for the following:    Glucose, Bld 141 (*)    GFR calc non Af Amer 77 (*)    GFR calc Af Amer 89 (*)    All other components within normal limits  PROTIME-INR - Abnormal; Notable for the following:    Prothrombin Time 23.9 (*)    INR 2.22  (*)    All other components within normal limits  PRO B NATRIURETIC PEPTIDE - Abnormal; Notable for the following:    Pro B Natriuretic peptide (BNP) 1235.0 (*)    All other components within normal limits  URINALYSIS, ROUTINE W REFLEX MICROSCOPIC  CBC WITH DIFFERENTIAL  TROPONIN I    Imaging Review Dg Chest 2 View  05/12/2014   CLINICAL DATA:  Weakness and fatigue  EXAM: CHEST  2 VIEW  COMPARISON:  AP and lateral views of the chest of May 08, 2014  FINDINGS: The lungs are reasonably well inflated. The interstitial markings have become more conspicuous since the previous study. The pulmonary vascularity is engorged and indistinct. The cardiac silhouette is enlarged. An electronic monitoring device projects over the lateral aspect of the left hemi thorax and is stable. No acute bony abnormality is present.  IMPRESSION: The findings are consistent with low-grade CHF. This reflects a change since the earlier  study.   Electronically Signed   By: David  Martinique   On: 05/12/2014 10:16   Ct Head Wo Contrast  05/12/2014   CLINICAL DATA:  Dizziness  EXAM: CT HEAD WITHOUT CONTRAST  TECHNIQUE: Contiguous axial images were obtained from the base of the skull through the vertex without intravenous contrast.  COMPARISON:  09/15/2011  FINDINGS: The bony calvarium is intact. Diffuse atrophic changes are noted commensurate the patient's given age. No findings to suggest acute hemorrhage, acute infarction or space-occupying mass lesion are noted. Chronic white matter ischemic changes noted as well.  IMPRESSION: Chronic changes without acute abnormality.   Electronically Signed   By: Inez Catalina M.D.   On: 05/12/2014 14:37     EKG Interpretation None      MDM   Final diagnoses:  Near syncope   Patient from home with apparent syncopal episode while lying in bed. Brief period of unresponsiveness. Now back to baseline. Awake and alert. Atrial fibrillation EKG. Denies dizziness or lightheadedness.   Recent  discharge summary reviewed. Patient and family member declined CT and MRI brain as well as echocardiogram stating they would not want any additional workup given her age if she had underlying bowel pathology or neurological issue.  INR is therapeutic. Labs are at baseline. Urinalysis is negative. EKG is unchanged. Chest x-ray shows mild congestion. BNP elevated at 1200. SHe has not taken her lasix today which was ordered.  Patient and daughter still feel that patient would not be interest in any type of valve surgery or further neurological workup.  She feels back to baseline now and is able to ambulate.  Dr. Wendee Beavers has seen patient and feels that she does not require readmission and can be discharged home as this was likely a vasovagal episode.  Patient and daughter in agreement.    Date: 05/12/2014  Rate: 87  Rhythm: atrial fibrillation  QRS Axis: normal  Intervals: normal  ST/T Wave abnormalities: nonspecific ST/T changes  Conduction Disutrbances:left bundle branch block  Narrative Interpretation:   Old EKG Reviewed: unchanged    Ezequiel Essex, MD 05/12/14 1751

## 2014-05-12 NOTE — ED Notes (Signed)
EMS states pt comes from home where she has a caregiver that states that pt wasn't at her baseline bc normally pt talks and gets out of bed but not doing that this morning.  Per EMS pt was talking when they got to the house and only complaint is weakness.  Pt was admitted on June 7 for dehydration and was released on Tuesday.

## 2014-05-12 NOTE — ED Notes (Signed)
Bed: WA10 Expected date:  Expected time:  Means of arrival:  Comments: EMS-weakness 

## 2014-05-12 NOTE — Consult Note (Signed)
[facility] Medical Consultation  Kathleen Mccarty NLZ:[id number] DOB: [birth year] DOA: 05/12/2014 PCP: Thalia Party   Requesting physician: Dr. Wyvonnia Dusky Date of consultation: 05/12/14 Reason for consultation: dizziness (resolved) elevated BNP  Impression/Recommendations Active Problems: Elevated BNP - Recommend administering Lasix prior to discharging as patient has not taken any lasix today. Also discussed with daughter limiting sodium intake. - Pt saturating at 100 % on room air based on EMR records  Dizziness - occurred while patient was getting up out of bed this am. Based on history it sounds like she had orthostatic related dizziness which has resolved. Pt currently has no complaints related to dizziness. - Have recommended discontinuing imdur.   Atrial fibrillation - discussed pro's and con's related to continued coumadin use. But family would like to protect patient against future strokes.  Chief Complaint: Dizziness  HPI:  Patient is a 80 year old Caucasian female with history of atrial fibrillation on Coumadin, prior history of stroke, CAD, hypertension, hypothyroidism, and recent hospitalization for syncope. Patient presents to the ED complaining of transient dizziness which subsided. Reportedly occurred as patient was getting up out of bed and after having taken her blood pressure medication this morning. The patient denies any chest pain, shortness of breath, fevers, or chills.  Currently denies any dizziness, headaches, new focal neurological deficits.  Review of Systems:  14 point review of systems reviewed and negative unless otherwise mentioned above  Past Medical History  Diagnosis Date  . Dysrhythmia   . Cancer   . Arthritis   . History of thyroidectomy   . Hypertension   . Hyperlipidemia   . Atrial fibrillation     on coumadin   Past Surgical History  Procedure Laterality Date  . Breast surgery    . Abdominal hysterectomy     Social History:   reports that she has never smoked. She has never used smokeless tobacco. She reports that she does not drink alcohol or use illicit drugs.  Allergies  Allergen Reactions  . Amoxicillin     "Hard on stomach"  . Cephalosporins     "hard on stomach"  . Codeine     Unsure of reaction, may have been rash or may have been confusion  . Eggs Or Egg-Derived Products Swelling    Whole body swelling can't have raw eggs  . Iodine     Unknown reaction   . Shellfish Allergy Swelling    Whole body swells   . Adhesive [Tape] Rash    Sensitive to bandages   No family history on file.  Prior to Admission medications   Medication Sig Start Date End Date Taking? Authorizing Provider  acetaminophen (TYLENOL) 500 MG tablet Take 1,000 mg by mouth 4 (four) times daily.    Yes Historical Provider, MD  aspirin EC 81 MG tablet Take 81 mg by mouth every evening.   Yes Historical Provider, MD  Cyanocobalamin (VITAMIN B-12 PO) Take 1 tablet by mouth daily before breakfast.   Yes Historical Provider, MD  dorzolamide-timolol (COSOPT) 22.3-6.8 MG/ML ophthalmic solution Place 1 drop into the left eye 2 (two) times daily.    Yes Historical Provider, MD  furosemide (LASIX) 20 MG tablet Take 20 mg by mouth every morning. As needed for edema or fluid   Yes Historical Provider, MD  isosorbide mononitrate (IMDUR) 60 MG 24 hr tablet Take 0.5 tablets (30 mg total) by mouth every evening. 05/10/14  Yes Ripudeep Krystal Eaton, MD  levothyroxine (SYNTHROID, LEVOTHROID) 112 MCG tablet Take 112 mcg by mouth daily  before breakfast.    Yes Historical Provider, MD  metoprolol succinate (TOPROL-XL) 50 MG 24 hr tablet Take 1 tablet (50 mg total) by mouth daily after breakfast. Take with or immediately following a meal. 05/10/14  Yes Ripudeep K Rai, MD  Multiple Vitamins-Minerals (ICAPS MV) TABS Take 2 tablets by mouth 2 (two) times daily.    Yes Historical Provider, MD  nitroGLYCERIN (NITROSTAT) 0.4 MG SL tablet Place 0.4 mg under the tongue every  5 (five) minutes as needed. For chest pain   Yes Historical Provider, MD  PRESCRIPTION MEDICATION Type of injection for hip pain given at doctors office Unknown name of injection   Yes Historical Provider, MD  simvastatin (ZOCOR) 40 MG tablet Take 40 mg by mouth every morning.    Yes Historical Provider, MD  Travoprost, BAK Free, (TRAVATAN) 0.004 % SOLN ophthalmic solution Place 1 drop into the left eye at bedtime.     Yes Historical Provider, MD  warfarin (COUMADIN) 4 MG tablet Take 4 mg by mouth every evening.    Yes Historical Provider, MD   Physical Exam: Blood pressure 142/79, pulse 83, temperature 98 F (36.7 C), temperature source Oral, resp. rate 19, SpO2 100.00%. Filed Vitals:   05/12/14 1208  BP: 142/79  Pulse: 83  Temp:   Resp: 19     General:  Patient in no acute distress, alert and awake  Eyes: Extraocular movements intact, nonicteric  ENT: Normal exterior appearance  Neck: Supple, no goiter  Cardiovascular: Irregularly irregular rate controlled, no rubs  Respiratory: Clear to auscultation bilaterally, no wheezes  Abdomen: Nondistended nontender  Skin: Warm, dry  Musculoskeletal: No cyanosis or clubbing  Psychiatric: Mood and affect appropriate  Neurologic: Answers questions appropriately and moves all extremities equally  Labs on Admission:  Basic Metabolic Panel:  Recent Labs Lab 05/08/14 1020 05/09/14 0513 05/12/14 0944  NA 143 144 146  K 4.0 4.3 4.1  CL 103 108 107  CO2 26 25 27   GLUCOSE 139* 149* 141*  BUN 18 16 15   CREATININE 0.64 0.65 0.58  CALCIUM 9.3 8.8 9.3   Liver Function Tests:  Recent Labs Lab 05/08/14 1020 05/12/14 0944  AST 16 19  ALT 14 18  ALKPHOS 71 67  BILITOT 0.5 0.5  PROT 6.7 6.8  ALBUMIN 3.6 3.5   No results found for this basename: LIPASE, AMYLASE,  in the last 168 hours No results found for this basename: AMMONIA,  in the last 168 hours CBC:  Recent Labs Lab 05/08/14 1020 05/09/14 0513 05/12/14 0944   WBC 10.9* 8.4 8.5  NEUTROABS 8.2*  --  6.5  HGB 14.5 13.0 14.8  HCT 42.8 39.5 44.0  MCV 91.5 92.3 91.5  PLT 173 138* 158   Cardiac Enzymes:  Recent Labs Lab 05/08/14 1513 05/08/14 1824 05/09/14 0038 05/09/14 0513 05/12/14 0944  TROPONINI <0.30 <0.30 <0.30 <0.30 <0.30   BNP: No components found with this basename: POCBNP,  CBG: No results found for this basename: GLUCAP,  in the last 168 hours  Radiological Exams on Admission: Dg Chest 2 View  05/12/2014   CLINICAL DATA:  Weakness and fatigue  EXAM: CHEST  2 VIEW  COMPARISON:  AP and lateral views of the chest of May 08, 2014  FINDINGS: The lungs are reasonably well inflated. The interstitial markings have become more conspicuous since the previous study. The pulmonary vascularity is engorged and indistinct. The cardiac silhouette is enlarged. An electronic monitoring device projects over the lateral aspect of the left  hemi thorax and is stable. No acute bony abnormality is present.  IMPRESSION: The findings are consistent with low-grade CHF. This reflects a change since the earlier study.   Electronically Signed   By: David  Martinique   On: 05/12/2014 10:16    EKG: Independently reviewed. Atrial fibrillation rate controlled.  Time spent: 45 minutes  Velvet Bathe [facility] Pager [phone]  If 7PM-7AM, please contact night-coverage [email or web address] Password [facility] 05/12/2014, 12:09 PM

## 2014-05-12 NOTE — Discharge Instructions (Signed)
Near-Syncope Take medications as prescribed and followup with her Dr. this week. Return to the ED if you develop new or worsening symptoms. Near-syncope (commonly known as near fainting) is sudden weakness, dizziness, or feeling like you might pass out. During an episode of near-syncope, you may also develop pale skin, have tunnel vision, or feel sick to your stomach (nauseous). Near-syncope may occur when getting up after sitting or while standing for a long time. It is caused by a sudden decrease in blood flow to the brain. This decrease can result from various causes or triggers, most of which are not serious. However, because near-syncope can sometimes be a sign of something serious, a medical evaluation is required. The specific cause is often not determined. HOME CARE INSTRUCTIONS  Monitor your condition for any changes. The following actions may help to alleviate any discomfort you are experiencing:  Have someone stay with you until you feel stable.  Lie down right away if you start feeling like you might faint. Breathe deeply and steadily. Wait until all the symptoms have passed. Most of these episodes last only a few minutes. You may feel tired for several hours.   Drink enough fluids to keep your urine clear or pale yellow.   If you are taking blood pressure or heart medicine, get up slowly when seated or lying down. Take several minutes to sit and then stand. This can reduce dizziness.  Follow up with your health care provider as directed. SEEK IMMEDIATE MEDICAL CARE IF:   You have a severe headache.   You have unusual pain in the chest, abdomen, or back.   You are bleeding from the mouth or rectum, or you have black or tarry stool.   You have an irregular or very fast heartbeat.   You have repeated fainting or have seizure-like jerking during an episode.   You faint when sitting or lying down.   You have confusion.   You have difficulty walking.   You have  severe weakness.   You have vision problems.  MAKE SURE YOU:   Understand these instructions.  Will watch your condition.  Will get help right away if you are not doing well or get worse. Document Released: 11/18/2005 Document Revised: 07/21/2013 Document Reviewed: 04/23/2013 [facility] Patient Information 2014 [place].

## 2014-05-12 NOTE — ED Notes (Signed)
Patient is constently urinating, changed her 3 times in the last 20 minutes

## 2014-05-12 NOTE — ED Notes (Signed)
Patient is a little unsteady trying to walk. When I walked her we barely made it out of the room. O2 states were 94

## 2014-05-17 ENCOUNTER — Encounter (HOSPITAL_COMMUNITY): Payer: Self-pay | Admitting: Emergency Medicine

## 2014-05-17 ENCOUNTER — Emergency Department (HOSPITAL_COMMUNITY): Payer: Medicare HMO

## 2014-05-17 ENCOUNTER — Emergency Department (HOSPITAL_COMMUNITY)
Admission: EM | Admit: 2014-05-17 | Discharge: 2014-05-17 | Disposition: A | Discharge status: Home / Self Care | Payer: Medicare HMO | Attending: Emergency Medicine | Admitting: Emergency Medicine

## 2014-05-17 DIAGNOSIS — R109 Unspecified abdominal pain: Secondary | ICD-10-CM

## 2014-05-17 DIAGNOSIS — Z859 Personal history of malignant neoplasm, unspecified: Secondary | ICD-10-CM | POA: Insufficient documentation

## 2014-05-17 DIAGNOSIS — R1013 Epigastric pain: Secondary | ICD-10-CM | POA: Insufficient documentation

## 2014-05-17 DIAGNOSIS — Z9089 Acquired absence of other organs: Secondary | ICD-10-CM | POA: Insufficient documentation

## 2014-05-17 DIAGNOSIS — R011 Cardiac murmur, unspecified: Secondary | ICD-10-CM | POA: Insufficient documentation

## 2014-05-17 DIAGNOSIS — I4891 Unspecified atrial fibrillation: Secondary | ICD-10-CM | POA: Insufficient documentation

## 2014-05-17 DIAGNOSIS — M129 Arthropathy, unspecified: Secondary | ICD-10-CM | POA: Insufficient documentation

## 2014-05-17 DIAGNOSIS — Z7901 Long term (current) use of anticoagulants: Secondary | ICD-10-CM | POA: Insufficient documentation

## 2014-05-17 DIAGNOSIS — Z79899 Other long term (current) drug therapy: Secondary | ICD-10-CM | POA: Insufficient documentation

## 2014-05-17 DIAGNOSIS — Z7982 Long term (current) use of aspirin: Secondary | ICD-10-CM | POA: Insufficient documentation

## 2014-05-17 DIAGNOSIS — I10 Essential (primary) hypertension: Secondary | ICD-10-CM | POA: Insufficient documentation

## 2014-05-17 DIAGNOSIS — R110 Nausea: Secondary | ICD-10-CM | POA: Insufficient documentation

## 2014-05-17 DIAGNOSIS — Z880 Allergy status to penicillin: Secondary | ICD-10-CM | POA: Insufficient documentation

## 2014-05-17 DIAGNOSIS — R1012 Left upper quadrant pain: Secondary | ICD-10-CM | POA: Insufficient documentation

## 2014-05-17 DIAGNOSIS — E785 Hyperlipidemia, unspecified: Secondary | ICD-10-CM | POA: Insufficient documentation

## 2014-05-17 DIAGNOSIS — R1011 Right upper quadrant pain: Secondary | ICD-10-CM | POA: Insufficient documentation

## 2014-05-17 LAB — I-STAT CG4 LACTIC ACID, ED: LACTIC ACID, VENOUS: 0.66 mmol/L (ref 0.5–2.2)

## 2014-05-17 LAB — CBC WITH DIFFERENTIAL/PLATELET
BASOS ABS: 0.1 10*3/uL (ref 0.0–0.1)
Basophils Relative: 1 % (ref 0–1)
EOS PCT: 1 % (ref 0–5)
Eosinophils Absolute: 0.1 10*3/uL (ref 0.0–0.7)
HCT: 43.9 % (ref 36.0–46.0)
Hemoglobin: 14.8 g/dL (ref 12.0–15.0)
LYMPHS PCT: 14 % (ref 12–46)
Lymphs Abs: 1.4 10*3/uL (ref 0.7–4.0)
MCH: 30.2 pg (ref 26.0–34.0)
MCHC: 33.7 g/dL (ref 30.0–36.0)
MCV: 89.6 fL (ref 78.0–100.0)
Monocytes Absolute: 0.7 10*3/uL (ref 0.1–1.0)
Monocytes Relative: 7 % (ref 3–12)
NEUTROS PCT: 77 % (ref 43–77)
Neutro Abs: 7.2 10*3/uL (ref 1.7–7.7)
PLATELETS: 161 10*3/uL (ref 150–400)
RBC: 4.9 MIL/uL (ref 3.87–5.11)
RDW: 12.8 % (ref 11.5–15.5)
WBC: 9.4 10*3/uL (ref 4.0–10.5)

## 2014-05-17 LAB — COMPREHENSIVE METABOLIC PANEL
ALK PHOS: 69 U/L (ref 39–117)
ALT: 18 U/L (ref 0–35)
AST: 21 U/L (ref 0–37)
Albumin: 3.6 g/dL (ref 3.5–5.2)
BUN: 20 mg/dL (ref 6–23)
CO2: 26 meq/L (ref 19–32)
Calcium: 9.6 mg/dL (ref 8.4–10.5)
Chloride: 101 mEq/L (ref 96–112)
Creatinine, Ser: 0.68 mg/dL (ref 0.50–1.10)
GFR calc Af Amer: 85 mL/min — ABNORMAL LOW (ref >90)
GFR calc non Af Amer: 73 mL/min — ABNORMAL LOW (ref >90)
Glucose, Bld: 136 mg/dL — ABNORMAL HIGH (ref 70–99)
POTASSIUM: 4.2 meq/L (ref 3.7–5.3)
SODIUM: 141 meq/L (ref 137–147)
TOTAL PROTEIN: 6.6 g/dL (ref 6.0–8.3)
Total Bilirubin: 0.5 mg/dL (ref 0.3–1.2)

## 2014-05-17 LAB — I-STAT CHEM 8, ED
BUN: 20 mg/dL (ref 6–23)
CREATININE: 0.7 mg/dL (ref 0.50–1.10)
Calcium, Ion: 1.18 mmol/L (ref 1.13–1.30)
Chloride: 100 mEq/L (ref 96–112)
Glucose, Bld: 134 mg/dL — ABNORMAL HIGH (ref 70–99)
HEMATOCRIT: 45 % (ref 36.0–46.0)
Hemoglobin: 15.3 g/dL — ABNORMAL HIGH (ref 12.0–15.0)
Potassium: 4 mEq/L (ref 3.7–5.3)
SODIUM: 142 meq/L (ref 137–147)
TCO2: 27 mmol/L (ref 0–100)

## 2014-05-17 LAB — URINALYSIS, ROUTINE W REFLEX MICROSCOPIC
Bilirubin Urine: NEGATIVE
GLUCOSE, UA: NEGATIVE mg/dL
HGB URINE DIPSTICK: NEGATIVE
Ketones, ur: NEGATIVE mg/dL
Leukocytes, UA: NEGATIVE
Nitrite: NEGATIVE
Protein, ur: NEGATIVE mg/dL
SPECIFIC GRAVITY, URINE: 1.006 (ref 1.005–1.030)
Urobilinogen, UA: 0.2 mg/dL (ref 0.0–1.0)
pH: 7 (ref 5.0–8.0)

## 2014-05-17 LAB — PROTIME-INR
INR: 2.69 — AB (ref 0.00–1.49)
PROTHROMBIN TIME: 27.7 s — AB (ref 11.6–15.2)

## 2014-05-17 LAB — LIPASE, BLOOD: Lipase: 37 U/L (ref 11–59)

## 2014-05-17 LAB — TROPONIN I

## 2014-05-17 MED ORDER — DOCUSATE SODIUM 100 MG PO CAPS: 100.0000 mg | ORAL_CAPSULE | Freq: Two times a day (BID) | ORAL | Status: DC

## 2014-05-17 MED ORDER — RANITIDINE HCL 150 MG PO TABS: 150.0000 mg | ORAL_TABLET | Freq: Two times a day (BID) | ORAL | Status: DC

## 2014-05-17 MED ORDER — ACETAMINOPHEN 325 MG PO TABS
650.0000 mg | ORAL_TABLET | Freq: Once | ORAL | Status: AC
  Administered 2014-05-17: 650 mg via ORAL
  Filled 2014-05-17: qty 2

## 2014-05-17 NOTE — ED Notes (Signed)
Bed: WA12 Expected date:  Expected time:  Means of arrival:  Comments: EMS abd pain 

## 2014-05-17 NOTE — Discharge Instructions (Signed)
Abdominal Pain, Adult °Many things can cause abdominal pain. Usually, abdominal pain is not caused by a disease and will improve without treatment. It can often be observed and treated at home. Your health care provider will do a physical exam and possibly order blood tests and X-rays to help determine the seriousness of your pain. However, in many cases, more time must pass before a clear cause of the pain can be found. Before that point, your health care provider may not know if you need more testing or further treatment. °HOME CARE INSTRUCTIONS  °Monitor your abdominal pain for any changes. The following actions may help to alleviate any discomfort you are experiencing: °· Only take over-the-counter or prescription medicines as directed by your health care provider. °· Do not take laxatives unless directed to do so by your health care provider. °· Try a clear liquid diet (broth, tea, or water) as directed by your health care provider. Slowly move to a bland diet as tolerated. °SEEK MEDICAL CARE IF: °· You have unexplained abdominal pain. °· You have abdominal pain associated with nausea or diarrhea. °· You have pain when you urinate or have a bowel movement. °· You experience abdominal pain that wakes you in the night. °· You have abdominal pain that is worsened or improved by eating food. °· You have abdominal pain that is worsened with eating fatty foods. °SEEK IMMEDIATE MEDICAL CARE IF:  °· Your pain does not go away within 2 hours. °· You have a fever. °· You keep throwing up (vomiting). °· Your pain is felt only in portions of the abdomen, such as the right side or the left lower portion of the abdomen. °· You pass bloody or black tarry stools. °MAKE SURE YOU: °· Understand these instructions.   °· Will watch your condition.   °· Will get help right away if you are not doing well or get worse.   °Document Released: 08/28/2005 Document Revised: 09/08/2013 Document Reviewed: 07/28/2013 °[employer]® Patient  Information ©2014 [employer]. ° °

## 2014-05-17 NOTE — ED Notes (Signed)
Per EMS- Patient is from home and has a home health nurse. Patient c/o "not feeling well." Patient c/o RUQ abdominal pain and nausea, no vomiting.

## 2014-05-17 NOTE — ED Provider Notes (Signed)
CSN: [id number]     Arrival date & time 05/17/14  1133 History   First MD Initiated Contact with Patient 05/17/14 1155     Chief Complaint  Patient presents with  . Abdominal Pain  . Nausea     (Consider location/radiation/quality/duration/timing/severity/associated sxs/prior Treatment) HPI Comments: Patient with past surgical history of appendectomy, atrial fibrillation on Coumadin -- presents with complaint of upper abdominal pain which began this morning after she woke up. She's had nausea but no vomiting. No diarrhea or dysuria. No fever. No chest pain or shortness of breath. Pain is in the upper abdomen in the middle with radiation to her right upper abdomen and the right side. Patient has not had pain like this in the past. She has been taking her medications as prescribed. Patient was recently hospitalized for near syncopal episode and was seen again in emergency department on 6/11 and discharged home.   Patient is a 80 y.o. female presenting with abdominal pain. The history is provided by the patient and medical records.  Abdominal Pain Associated symptoms: nausea   Associated symptoms: no chest pain, no cough, no diarrhea, no dysuria, no fever, no sore throat and no vomiting     Past Medical History  Diagnosis Date  . Dysrhythmia   . Cancer   . Arthritis   . History of thyroidectomy   . Hypertension   . Hyperlipidemia   . Atrial fibrillation     on coumadin   Past Surgical History  Procedure Laterality Date  . Breast surgery    . Abdominal hysterectomy     No family history on file. History  Substance Use Topics  . Smoking status: Never Smoker   . Smokeless tobacco: Never Used  . Alcohol Use: No   OB History   Grav Para Term Preterm Abortions TAB SAB Ect Mult Living                 Review of Systems  Constitutional: Negative for fever.  HENT: Negative for rhinorrhea and sore throat.   Eyes: Negative for redness.  Respiratory: Negative for cough.    Cardiovascular: Negative for chest pain.  Gastrointestinal: Positive for nausea and abdominal pain. Negative for vomiting and diarrhea.  Genitourinary: Negative for dysuria.  Musculoskeletal: Negative for myalgias.  Skin: Negative for rash.  Neurological: Negative for headaches.    Allergies  Amoxicillin; Cephalosporins; Codeine; Eggs or egg-derived products; Iodine; Shellfish allergy; and Adhesive  Home Medications   Prior to Admission medications   Medication Sig Start Date End Date Taking? Authorizing Provider  acetaminophen (TYLENOL) 500 MG tablet Take 1,000 mg by mouth every 6 (six) hours as needed (For pain.).    Yes Historical Provider, MD  aspirin EC 81 MG tablet Take 81 mg by mouth daily after supper.    Yes Historical Provider, MD  dorzolamide-timolol (COSOPT) 22.3-6.8 MG/ML ophthalmic solution Place 1 drop into the left eye 2 (two) times daily.    Yes Historical Provider, MD  furosemide (LASIX) 20 MG tablet Take 20 mg by mouth every morning.    Yes Historical Provider, MD  isosorbide mononitrate (IMDUR) 60 MG 24 hr tablet Take 30 mg by mouth daily after supper.    Yes Historical Provider, MD  levothyroxine (SYNTHROID, LEVOTHROID) 112 MCG tablet Take 112 mcg by mouth daily before breakfast.    Yes Historical Provider, MD  metoprolol succinate (TOPROL-XL) 50 MG 24 hr tablet Take 1 tablet (50 mg total) by mouth daily after breakfast. Take with or immediately  following a meal. 05/10/14  Yes Ripudeep K Rai, MD  Multiple Vitamins-Minerals (ICAPS MV) TABS Take 2 tablets by mouth 2 (two) times daily.    Yes Historical Provider, MD  nitroGLYCERIN (NITROSTAT) 0.4 MG SL tablet Place 0.4 mg under the tongue every 5 (five) minutes as needed for chest pain.    Yes Historical Provider, MD  simvastatin (ZOCOR) 40 MG tablet Take 40 mg by mouth every morning.    Yes Historical Provider, MD  Travoprost, BAK Free, (TRAVATAN) 0.004 % SOLN ophthalmic solution Place 1 drop into the left eye every  morning.    Yes Historical Provider, MD  vitamin B-12 (CYANOCOBALAMIN) 500 MCG tablet Take 500 mcg by mouth every morning.   Yes Historical Provider, MD  warfarin (COUMADIN) 4 MG tablet Take 4 mg by mouth daily after supper.    Yes Historical Provider, MD   BP 139/80  Pulse 83  Temp(Src) 98.7 F (37.1 C) (Oral)  Resp 18  SpO2 97%  Physical Exam  Nursing note and vitals reviewed. Constitutional: She appears well-developed and well-nourished.  HENT:  Head: Normocephalic and atraumatic.  Eyes: Conjunctivae are normal. Right eye exhibits no discharge. Left eye exhibits no discharge.  Neck: Normal range of motion. Neck supple.  Cardiovascular: Normal rate and regular rhythm.   Murmur (4/6 holosystolic) heard. Pulmonary/Chest: Effort normal and breath sounds normal. No respiratory distress. She has no wheezes. She has no rales.  Abdominal: Soft. There is tenderness in the right upper quadrant, epigastric area and left upper quadrant. There is no rigidity, no rebound, no guarding, no tenderness at McBurney's point and negative Murphy's sign.  Neurological: She is alert.  Skin: Skin is warm and dry.  Psychiatric: She has a normal mood and affect.    ED Course  Procedures (including critical care time) Labs Review Labs Reviewed  COMPREHENSIVE METABOLIC PANEL - Abnormal; Notable for the following:    Glucose, Bld 136 (*)    GFR calc non Af Amer 73 (*)    GFR calc Af Amer 85 (*)    All other components within normal limits  PROTIME-INR - Abnormal; Notable for the following:    Prothrombin Time 27.7 (*)    INR 2.69 (*)    All other components within normal limits  I-STAT CHEM 8, ED - Abnormal; Notable for the following:    Glucose, Bld 134 (*)    Hemoglobin 15.3 (*)    All other components within normal limits  CBC WITH DIFFERENTIAL  LIPASE, BLOOD  URINALYSIS, ROUTINE W REFLEX MICROSCOPIC  TROPONIN I  I-STAT CG4 LACTIC ACID, ED    Imaging Review US Abdomen  Complete  05/17/2014   CLINICAL DATA:  Right upper quadrant abdominal pain today. History of hypertension and diabetes.  EXAM: ULTRASOUND ABDOMEN COMPLETE  COMPARISON:  None.  FINDINGS: Examination is mildly limited by body habitus and bowel gas.  Gallbladder:  No gallstones or wall thickening visualized. No sonographic Murphy sign noted.  Common bile duct:  Diameter: 6 mm.  No evidence of choledocholithiasis.  Liver:  No focal lesion identified. Within normal limits in parenchymal echogenicity.  IVC:  No abnormality visualized.  Pancreas:  Visualized portion unremarkable.  Spleen:  Size and appearance within normal limits.  Right Kidney:  Length: 10.0 cm. Echogenicity within normal limits. No mass or hydronephrosis visualized.  Left Kidney:  Length: 10.2 cm. Echogenicity within normal limits. No mass or hydronephrosis visualized.  Abdominal aorta:  No aneurysm visualized.  Other findings:  None.  IMPRESSION:  No acute or significant findings demonstrated. The gallbladder and biliary system appear unremarkable.   Electronically Signed   By: Camie Patience M.D.   On: 05/17/2014 13:54     EKG Interpretation None      12:27 PM Patient seen and examined. Work-up initiated.    Vital signs reviewed and are as follows: Filed Vitals:   05/17/14 1134  BP: 139/80  Pulse: 83  Temp: 98.7 F (37.1 C)  Resp: 18   4:00 PM Dr. Maryan Rued saw patient previously. CT ordered given more generalized abd pain.   Labs reassuring.   Plan: if CT neg, patient can be discharged after re-exam. Otherwise admit.    MDM   Final diagnoses:  Abdominal pain   Pending CT. Patient is well-appearing.     Carlisle Cater, PA-C 05/17/14 1601

## 2014-05-17 NOTE — Progress Notes (Signed)
  CARE MANAGEMENT ED NOTE 05/17/2014  Patient:  EVY, LUTTERMAN   Account Number:  [id number]  Date Initiated:  05/17/2014  Documentation initiated by:  Livia Snellen  Subjective/Objective Assessment:   Patient presents to  ED with abdominal pain     Subjective/Objective Assessment Detail:     Action/Plan:   Action/Plan Detail:   Anticipated DC Date:       Status Recommendation to Physician:   Result of Recommendation:    Other ED [place]  Other  PCP issues    Choice offered to / List presented to:            Status of service:  Completed, signed off  ED Comments:   ED Comments Detail:  EDCM spoke to patient and her daughter at bedside.  Patient confirms she lives alone, however, patient receives 24 hour care by private duty [employer].  Patient reports the agency helps her with her ADL's, meals and housework. Patient has a walker, cane and shower chair at home. Patient has also just started PT, OT with [facility] yesterday. Patient's duaghter reports she lives approximately 30 minuets away from patient.  Patient's daughter reports she has the cntact information for [facility]. Patient reports she is without home health needs at this time.  No further EDCM needs at this time.

## 2014-05-17 NOTE — ED Notes (Signed)
PO challenge initiated per verbal order from PA. Pt given decaf coffee to drink per request.

## 2014-05-17 NOTE — ED Provider Notes (Signed)
Patient signed out to me by Kathleen Oh, PA-C at change of shift. Patient complains of generalized abdominal pain. Labs and [place] are unremarkable. CT pending. If CT is unremarkable with PO challenge and discharge home.   Patient tolerated PO challenge. CT is unremarkable. Will give Zantac and Colace. Dr. Jeneen Mccarty evaluated patient and agrees with plan.   Results for orders placed during the hospital encounter of 05/17/14  CBC WITH DIFFERENTIAL      Result Value Ref Range   WBC 9.4  4.0 - 10.5 K/uL   RBC 4.90  3.87 - 5.11 MIL/uL   Hemoglobin 14.8  12.0 - 15.0 g/dL   HCT 43.9  36.0 - 46.0 %   MCV 89.6  78.0 - 100.0 fL   MCH 30.2  26.0 - 34.0 pg   MCHC 33.7  30.0 - 36.0 g/dL   RDW 12.8  11.5 - 15.5 %   Platelets 161  150 - 400 K/uL   Neutrophils Relative % 77  43 - 77 %   Neutro Abs 7.2  1.7 - 7.7 K/uL   Lymphocytes Relative 14  12 - 46 %   Lymphs Abs 1.4  0.7 - 4.0 K/uL   Monocytes Relative 7  3 - 12 %   Monocytes Absolute 0.7  0.1 - 1.0 K/uL   Eosinophils Relative 1  0 - 5 %   Eosinophils Absolute 0.1  0.0 - 0.7 K/uL   Basophils Relative 1  0 - 1 %   Basophils Absolute 0.1  0.0 - 0.1 K/uL  COMPREHENSIVE METABOLIC PANEL      Result Value Ref Range   Sodium 141  137 - 147 mEq/L   Potassium 4.2  3.7 - 5.3 mEq/L   Chloride 101  96 - 112 mEq/L   CO2 26  19 - 32 mEq/L   Glucose, Bld 136 (*) 70 - 99 mg/dL   BUN 20  6 - 23 mg/dL   Creatinine, Ser 0.68  0.50 - 1.10 mg/dL   Calcium 9.6  8.4 - 10.5 mg/dL   Total Protein 6.6  6.0 - 8.3 g/dL   Albumin 3.6  3.5 - 5.2 g/dL   AST 21  0 - 37 U/L   ALT 18  0 - 35 U/L   Alkaline Phosphatase 69  39 - 117 U/L   Total Bilirubin 0.5  0.3 - 1.2 mg/dL   GFR calc non Af Amer 73 (*) >90 mL/min   GFR calc Af Amer 85 (*) >90 mL/min  LIPASE, BLOOD      Result Value Ref Range   Lipase 37  11 - 59 U/L  URINALYSIS, ROUTINE W REFLEX MICROSCOPIC      Result Value Ref Range   Color, Urine YELLOW  YELLOW   APPearance CLEAR  CLEAR   Specific Gravity, Urine 1.006   1.005 - 1.030   pH 7.0  5.0 - 8.0   Glucose, UA NEGATIVE  NEGATIVE mg/dL   Hgb urine dipstick NEGATIVE  NEGATIVE   Bilirubin Urine NEGATIVE  NEGATIVE   Ketones, ur NEGATIVE  NEGATIVE mg/dL   Protein, ur NEGATIVE  NEGATIVE mg/dL   Urobilinogen, UA 0.2  0.0 - 1.0 mg/dL   Nitrite NEGATIVE  NEGATIVE   Leukocytes, UA NEGATIVE  NEGATIVE  PROTIME-INR      Result Value Ref Range   Prothrombin Time 27.7 (*) 11.6 - 15.2 seconds   INR 2.69 (*) 0.00 - 1.49  TROPONIN I      Result Value Ref Range  Troponin I <0.30  <0.30 ng/mL  I-STAT CG4 LACTIC ACID, ED      Result Value Ref Range   Lactic Acid, Venous 0.66  0.5 - 2.2 mmol/L  I-STAT CHEM 8, ED      Result Value Ref Range   Sodium 142  137 - 147 mEq/L   Potassium 4.0  3.7 - 5.3 mEq/L   Chloride 100  96 - 112 mEq/L   BUN 20  6 - 23 mg/dL   Creatinine, Ser 0.70  0.50 - 1.10 mg/dL   Glucose, Bld 134 (*) 70 - 99 mg/dL   Calcium, Ion 1.18  1.13 - 1.30 mmol/L   TCO2 27  0 - 100 mmol/L   Hemoglobin 15.3 (*) 12.0 - 15.0 g/dL   HCT 45.0  36.0 - 46.0 %    No results found.   Elwyn Lade, PA-C 05/17/14 1931

## 2014-05-17 NOTE — ED Notes (Signed)
US at bedside

## 2014-05-17 NOTE — ED Notes (Signed)
Pt reports onset of RUQ abdominal pain starting this am. Pt denies vomiting but reports nausea. Pt reports normal BM yesterday but tenderness to RUQ upon palpation. Pt denies urinary symptoms.

## 2014-05-20 NOTE — ED Provider Notes (Signed)
Medical screening examination/treatment/procedure(s) were conducted as a shared visit with non-physician practitioner(s) and myself.  I personally evaluated the patient during the encounter.   EKG Interpretation None      Pt with abd pain with neg labs however on repeat exam she has diffuse periumbilical abd pain.  Will get CT to further eval.  Blanchie Dessert, MD 05/20/14 1126

## 2014-05-21 NOTE — ED Provider Notes (Signed)
Medical screening examination/treatment/procedure(s) were conducted as a shared visit with non-physician practitioner(s) and myself.  I personally evaluated the patient during the encounter.   EKG Interpretation None      Care discussed with PA Merrell.  Patient examined independently. Is a benign abdomen to exam. I reviewed her history. Primarily epigastric pain. Studies and CT scan are unrevealing. Plan will be treatment with H2 blocker. Symptom control. Close followup. ER with acute changes.  Tanna Furry, MD 05/21/14 [phone]

## 2014-06-19 ENCOUNTER — Encounter (HOSPITAL_COMMUNITY): Payer: Self-pay | Admitting: Emergency Medicine

## 2014-06-19 ENCOUNTER — Emergency Department (HOSPITAL_COMMUNITY)
Admission: EM | Admit: 2014-06-19 | Discharge: 2014-06-19 | Disposition: A | Discharge status: Home / Self Care | Payer: Medicare HMO | Attending: Emergency Medicine | Admitting: Emergency Medicine

## 2014-06-19 DIAGNOSIS — M129 Arthropathy, unspecified: Secondary | ICD-10-CM | POA: Insufficient documentation

## 2014-06-19 DIAGNOSIS — Z880 Allergy status to penicillin: Secondary | ICD-10-CM | POA: Insufficient documentation

## 2014-06-19 DIAGNOSIS — R5381 Other malaise: Secondary | ICD-10-CM | POA: Insufficient documentation

## 2014-06-19 DIAGNOSIS — Z7982 Long term (current) use of aspirin: Secondary | ICD-10-CM | POA: Insufficient documentation

## 2014-06-19 DIAGNOSIS — Z79899 Other long term (current) drug therapy: Secondary | ICD-10-CM | POA: Insufficient documentation

## 2014-06-19 DIAGNOSIS — Z7901 Long term (current) use of anticoagulants: Secondary | ICD-10-CM | POA: Insufficient documentation

## 2014-06-19 DIAGNOSIS — E785 Hyperlipidemia, unspecified: Secondary | ICD-10-CM | POA: Insufficient documentation

## 2014-06-19 DIAGNOSIS — R531 Weakness: Secondary | ICD-10-CM

## 2014-06-19 DIAGNOSIS — Z859 Personal history of malignant neoplasm, unspecified: Secondary | ICD-10-CM | POA: Insufficient documentation

## 2014-06-19 DIAGNOSIS — I4891 Unspecified atrial fibrillation: Secondary | ICD-10-CM | POA: Insufficient documentation

## 2014-06-19 DIAGNOSIS — R5383 Other fatigue: Principal | ICD-10-CM

## 2014-06-19 DIAGNOSIS — Z9889 Other specified postprocedural states: Secondary | ICD-10-CM | POA: Insufficient documentation

## 2014-06-19 DIAGNOSIS — I10 Essential (primary) hypertension: Secondary | ICD-10-CM | POA: Insufficient documentation

## 2014-06-19 LAB — URINALYSIS, ROUTINE W REFLEX MICROSCOPIC
Bilirubin Urine: NEGATIVE
Glucose, UA: NEGATIVE mg/dL
HGB URINE DIPSTICK: NEGATIVE
Ketones, ur: NEGATIVE mg/dL
Leukocytes, UA: NEGATIVE
Nitrite: NEGATIVE
Protein, ur: NEGATIVE mg/dL
SPECIFIC GRAVITY, URINE: 1.011 (ref 1.005–1.030)
Urobilinogen, UA: 0.2 mg/dL (ref 0.0–1.0)
pH: 7 (ref 5.0–8.0)

## 2014-06-19 LAB — CBC WITH DIFFERENTIAL/PLATELET
Basophils Absolute: 0 10*3/uL (ref 0.0–0.1)
Basophils Relative: 1 % (ref 0–1)
EOS ABS: 0.1 10*3/uL (ref 0.0–0.7)
Eosinophils Relative: 2 % (ref 0–5)
HCT: 38.1 % (ref 36.0–46.0)
Hemoglobin: 13.1 g/dL (ref 12.0–15.0)
Lymphocytes Relative: 18 % (ref 12–46)
Lymphs Abs: 1.3 10*3/uL (ref 0.7–4.0)
MCH: 30.6 pg (ref 26.0–34.0)
MCHC: 34.4 g/dL (ref 30.0–36.0)
MCV: 89 fL (ref 78.0–100.0)
Monocytes Absolute: 0.6 10*3/uL (ref 0.1–1.0)
Monocytes Relative: 8 % (ref 3–12)
NEUTROS PCT: 71 % (ref 43–77)
Neutro Abs: 5.1 10*3/uL (ref 1.7–7.7)
Platelets: 164 10*3/uL (ref 150–400)
RBC: 4.28 MIL/uL (ref 3.87–5.11)
RDW: 13.5 % (ref 11.5–15.5)
WBC: 7.1 10*3/uL (ref 4.0–10.5)

## 2014-06-19 LAB — COMPREHENSIVE METABOLIC PANEL
ALBUMIN: 3.4 g/dL — AB (ref 3.5–5.2)
ALT: 11 U/L (ref 0–35)
AST: 14 U/L (ref 0–37)
Alkaline Phosphatase: 65 U/L (ref 39–117)
Anion gap: 12 (ref 5–15)
BUN: 15 mg/dL (ref 6–23)
CO2: 28 mEq/L (ref 19–32)
Calcium: 9.3 mg/dL (ref 8.4–10.5)
Chloride: 103 mEq/L (ref 96–112)
Creatinine, Ser: 0.67 mg/dL (ref 0.50–1.10)
GFR calc non Af Amer: 73 mL/min — ABNORMAL LOW (ref >90)
GFR, EST AFRICAN AMERICAN: 85 mL/min — AB (ref >90)
GLUCOSE: 159 mg/dL — AB (ref 70–99)
Potassium: 3.8 mEq/L (ref 3.7–5.3)
Sodium: 143 mEq/L (ref 137–147)
TOTAL PROTEIN: 6.4 g/dL (ref 6.0–8.3)
Total Bilirubin: 0.6 mg/dL (ref 0.3–1.2)

## 2014-06-19 LAB — PROTIME-INR
INR: 1.86 — AB (ref 0.00–1.49)
PROTHROMBIN TIME: 21.4 s — AB (ref 11.6–15.2)

## 2014-06-19 LAB — TROPONIN I: Troponin I: <0.3 ng/mL (ref <0.30)

## 2014-06-19 MED ORDER — SODIUM CHLORIDE 0.9 % IV SOLN
INTRAVENOUS | Status: DC
Start: 2014-06-19 — End: 2014-06-19
  Administered 2014-06-19: 125 mL/h via INTRAVENOUS

## 2014-06-19 NOTE — ED Notes (Signed)
Patient from home c/o feeling tired and run down over the last day and a half.  No recent history of falls, or other symptoms.  Seen at [facility] last week for same symptoms.  Alert and oriented, daughter on the way, caregiver at bedside.

## 2014-06-19 NOTE — ED Notes (Signed)
Bed: WA09 Expected date:  Expected time:  Means of arrival:  Comments: EMS vertigo

## 2014-06-19 NOTE — Discharge Instructions (Signed)

## 2014-06-19 NOTE — ED Provider Notes (Signed)
CSN: [id number]     Arrival date & time 06/19/14  1001 History   First MD Initiated Contact with Patient 06/19/14 1013     Chief Complaint  Patient presents with  . Fatigue     (Consider location/radiation/quality/duration/timing/severity/associated sxs/prior Treatment) HPI Comments: Patient here feeling weak for over a day and a half. Has history of chronic weakness and has been evaluated for this multiple times without diagnosis. Denies any recent illnesses. Denies any fever, cough, congestion. No chest pain chest pressure. No abdominal pain. No black or bloody stools. Denies any urinary symptoms. This current episode became worse today when she went to use the bathroom and fell she might fall down. She denies any fall. Symptoms persisted and nothing makes it better. No treatment used prior to arrival.  According to her daughter, her other physicians are unable to find a etiology of her chronic condition  The history is provided by the patient.    Past Medical History  Diagnosis Date  . Dysrhythmia   . Cancer   . Arthritis   . History of thyroidectomy   . Hypertension   . Hyperlipidemia   . Atrial fibrillation     on coumadin   Past Surgical History  Procedure Laterality Date  . Breast surgery    . Abdominal hysterectomy     History reviewed. No pertinent family history. History  Substance Use Topics  . Smoking status: Never Smoker   . Smokeless tobacco: Never Used  . Alcohol Use: No   OB History   Grav Para Term Preterm Abortions TAB SAB Ect Mult Living                 Review of Systems  All other systems reviewed and are negative.     Allergies  Amoxicillin; Cephalosporins; Codeine; Eggs or egg-derived products; Iodine; Shellfish allergy; and Adhesive  Home Medications   Prior to Admission medications   Medication Sig Start Date End Date Taking? Authorizing Provider  acetaminophen (TYLENOL) 500 MG tablet Take 1,000 mg by mouth every 6 (six) hours as  needed (For pain.).     Historical Provider, MD  aspirin EC 81 MG tablet Take 81 mg by mouth daily after supper.     Historical Provider, MD  docusate sodium (COLACE) 100 MG capsule Take 1 capsule (100 mg total) by mouth every 12 (twelve) hours. 05/17/14   Elwyn Lade, PA-C  dorzolamide-timolol (COSOPT) 22.3-6.8 MG/ML ophthalmic solution Place 1 drop into the left eye 2 (two) times daily.     Historical Provider, MD  furosemide (LASIX) 20 MG tablet Take 20 mg by mouth every morning.     Historical Provider, MD  isosorbide mononitrate (IMDUR) 60 MG 24 hr tablet Take 30 mg by mouth daily after supper.     Historical Provider, MD  levothyroxine (SYNTHROID, LEVOTHROID) 112 MCG tablet Take 112 mcg by mouth daily before breakfast.     Historical Provider, MD  metoprolol succinate (TOPROL-XL) 50 MG 24 hr tablet Take 1 tablet (50 mg total) by mouth daily after breakfast. Take with or immediately following a meal. 05/10/14   Ripudeep Krystal Eaton, MD  Multiple Vitamins-Minerals (ICAPS MV) TABS Take 2 tablets by mouth 2 (two) times daily.     Historical Provider, MD  nitroGLYCERIN (NITROSTAT) 0.4 MG SL tablet Place 0.4 mg under the tongue every 5 (five) minutes as needed for chest pain.     Historical Provider, MD  ranitidine (ZANTAC) 150 MG tablet Take 1 tablet (150 mg  total) by mouth 2 (two) times daily. 05/17/14   Elwyn Lade, PA-C  simvastatin (ZOCOR) 40 MG tablet Take 40 mg by mouth every morning.     Historical Provider, MD  Travoprost, BAK Free, (TRAVATAN) 0.004 % SOLN ophthalmic solution Place 1 drop into the left eye every morning.     Historical Provider, MD  vitamin B-12 (CYANOCOBALAMIN) 500 MCG tablet Take 500 mcg by mouth every morning.    Historical Provider, MD  warfarin (COUMADIN) 4 MG tablet Take 4 mg by mouth daily after supper.     Historical Provider, MD   BP 115/70  Pulse 75  Temp(Src) 98 F (36.7 C) (Oral)  Resp 16  SpO2 96% Physical Exam  Nursing note and vitals  reviewed. Constitutional: She is oriented to person, place, and time. She appears well-developed and well-nourished.  Non-toxic appearance. No distress.  HENT:  Head: Normocephalic and atraumatic.  Eyes: Conjunctivae, EOM and lids are normal. Pupils are equal, round, and reactive to light.  Neck: Normal range of motion. Neck supple. No tracheal deviation present. No mass present.  Cardiovascular: Normal rate, regular rhythm and normal heart sounds.  Exam reveals no gallop.   No murmur heard. Pulmonary/Chest: Effort normal and breath sounds normal. No stridor. No respiratory distress. She has no decreased breath sounds. She has no wheezes. She has no rhonchi. She has no rales.  Abdominal: Soft. Normal appearance and bowel sounds are normal. She exhibits no distension. There is no tenderness. There is no rebound and no CVA tenderness.  Musculoskeletal: Normal range of motion. She exhibits no edema and no tenderness.  Neurological: She is alert and oriented to person, place, and time. She has normal strength. No cranial nerve deficit or sensory deficit. GCS eye subscore is 4. GCS verbal subscore is 5. GCS motor subscore is 6.  Skin: Skin is warm and dry. No abrasion and no rash noted.  Psychiatric: She has a normal mood and affect. Her speech is normal and behavior is normal.    ED Course  Procedures (including critical care time) Labs Review Labs Reviewed  URINE CULTURE  CBC WITH DIFFERENTIAL  COMPREHENSIVE METABOLIC PANEL  TROPONIN I  URINALYSIS, ROUTINE W REFLEX MICROSCOPIC  PROTIME-INR    Imaging Review No results found.   EKG Interpretation None      MDM   Final diagnoses:  None    Patient long-standing history of weakness and workup here has been negative. She has an appointment with her Dr. tomorrow and will keep that followup visit    Leota Jacobsen, MD 06/19/14 1210

## 2014-06-20 LAB — URINE CULTURE

## 2014-07-03 ENCOUNTER — Inpatient Hospital Stay (HOSPITAL_COMMUNITY)
Admission: EM | Admit: 2014-07-03 | Discharge: 2014-07-08 | DRG: 069 | Disposition: A | Discharge status: Skilled Nursing Facility | Payer: Medicare HMO | Attending: Family Medicine | Admitting: Family Medicine

## 2014-07-03 ENCOUNTER — Encounter (HOSPITAL_COMMUNITY): Payer: Self-pay | Admitting: Emergency Medicine

## 2014-07-03 ENCOUNTER — Emergency Department (HOSPITAL_COMMUNITY): Payer: Medicare HMO

## 2014-07-03 DIAGNOSIS — I509 Heart failure, unspecified: Secondary | ICD-10-CM | POA: Diagnosis present

## 2014-07-03 DIAGNOSIS — H3530 Unspecified macular degeneration: Secondary | ICD-10-CM | POA: Diagnosis present

## 2014-07-03 DIAGNOSIS — H409 Unspecified glaucoma: Secondary | ICD-10-CM | POA: Diagnosis present

## 2014-07-03 DIAGNOSIS — I472 Ventricular tachycardia, unspecified: Secondary | ICD-10-CM | POA: Diagnosis present

## 2014-07-03 DIAGNOSIS — G451 Carotid artery syndrome (hemispheric): Secondary | ICD-10-CM

## 2014-07-03 DIAGNOSIS — R4020 Unspecified coma: Secondary | ICD-10-CM | POA: Diagnosis not present

## 2014-07-03 DIAGNOSIS — Z66 Do not resuscitate: Secondary | ICD-10-CM | POA: Diagnosis present

## 2014-07-03 DIAGNOSIS — Z7982 Long term (current) use of aspirin: Secondary | ICD-10-CM

## 2014-07-03 DIAGNOSIS — L259 Unspecified contact dermatitis, unspecified cause: Secondary | ICD-10-CM | POA: Diagnosis present

## 2014-07-03 DIAGNOSIS — K5731 Diverticulosis of large intestine without perforation or abscess with bleeding: Secondary | ICD-10-CM | POA: Diagnosis not present

## 2014-07-03 DIAGNOSIS — I4891 Unspecified atrial fibrillation: Secondary | ICD-10-CM

## 2014-07-03 DIAGNOSIS — K219 Gastro-esophageal reflux disease without esophagitis: Secondary | ICD-10-CM | POA: Diagnosis present

## 2014-07-03 DIAGNOSIS — Z79899 Other long term (current) drug therapy: Secondary | ICD-10-CM

## 2014-07-03 DIAGNOSIS — B372 Candidiasis of skin and nail: Secondary | ICD-10-CM | POA: Diagnosis present

## 2014-07-03 DIAGNOSIS — R404 Transient alteration of awareness: Secondary | ICD-10-CM

## 2014-07-03 DIAGNOSIS — E785 Hyperlipidemia, unspecified: Secondary | ICD-10-CM | POA: Diagnosis present

## 2014-07-03 DIAGNOSIS — R569 Unspecified convulsions: Secondary | ICD-10-CM | POA: Diagnosis present

## 2014-07-03 DIAGNOSIS — K921 Melena: Secondary | ICD-10-CM

## 2014-07-03 DIAGNOSIS — E119 Type 2 diabetes mellitus without complications: Secondary | ICD-10-CM | POA: Diagnosis present

## 2014-07-03 DIAGNOSIS — I2510 Atherosclerotic heart disease of native coronary artery without angina pectoris: Secondary | ICD-10-CM | POA: Diagnosis present

## 2014-07-03 DIAGNOSIS — R4182 Altered mental status, unspecified: Secondary | ICD-10-CM

## 2014-07-03 DIAGNOSIS — I10 Essential (primary) hypertension: Secondary | ICD-10-CM

## 2014-07-03 DIAGNOSIS — I447 Left bundle-branch block, unspecified: Secondary | ICD-10-CM | POA: Diagnosis present

## 2014-07-03 DIAGNOSIS — G458 Other transient cerebral ischemic attacks and related syndromes: Secondary | ICD-10-CM

## 2014-07-03 DIAGNOSIS — G9340 Encephalopathy, unspecified: Secondary | ICD-10-CM | POA: Diagnosis present

## 2014-07-03 DIAGNOSIS — I482 Chronic atrial fibrillation, unspecified: Secondary | ICD-10-CM

## 2014-07-03 DIAGNOSIS — I4729 Other ventricular tachycardia: Secondary | ICD-10-CM | POA: Diagnosis present

## 2014-07-03 DIAGNOSIS — R1011 Right upper quadrant pain: Secondary | ICD-10-CM | POA: Diagnosis present

## 2014-07-03 DIAGNOSIS — Z8673 Personal history of transient ischemic attack (TIA), and cerebral infarction without residual deficits: Secondary | ICD-10-CM | POA: Diagnosis not present

## 2014-07-03 DIAGNOSIS — H5440 Blindness, one eye, unspecified eye: Secondary | ICD-10-CM | POA: Diagnosis present

## 2014-07-03 DIAGNOSIS — E039 Hypothyroidism, unspecified: Secondary | ICD-10-CM | POA: Diagnosis present

## 2014-07-03 DIAGNOSIS — Z7901 Long term (current) use of anticoagulants: Secondary | ICD-10-CM | POA: Diagnosis not present

## 2014-07-03 DIAGNOSIS — R40241 Glasgow coma scale score 13-15, unspecified time: Secondary | ICD-10-CM

## 2014-07-03 DIAGNOSIS — G459 Transient cerebral ischemic attack, unspecified: Principal | ICD-10-CM | POA: Diagnosis present

## 2014-07-03 DIAGNOSIS — R4701 Aphasia: Secondary | ICD-10-CM | POA: Diagnosis present

## 2014-07-03 DIAGNOSIS — E118 Type 2 diabetes mellitus with unspecified complications: Secondary | ICD-10-CM

## 2014-07-03 HISTORY — DX: Unspecified glaucoma: H40.9

## 2014-07-03 HISTORY — DX: Cerebral infarction, unspecified: I63.9

## 2014-07-03 HISTORY — DX: Disorder of thyroid, unspecified: E07.9

## 2014-07-03 HISTORY — DX: Type 2 diabetes mellitus without complications: E11.9

## 2014-07-03 HISTORY — DX: Heart failure, unspecified: I50.9

## 2014-07-03 LAB — URINALYSIS, ROUTINE W REFLEX MICROSCOPIC
BILIRUBIN URINE: NEGATIVE
GLUCOSE, UA: NEGATIVE mg/dL
HGB URINE DIPSTICK: NEGATIVE
KETONES UR: NEGATIVE mg/dL
Leukocytes, UA: NEGATIVE
Nitrite: NEGATIVE
PH: 8 (ref 5.0–8.0)
Protein, ur: NEGATIVE mg/dL
SPECIFIC GRAVITY, URINE: 1.012 (ref 1.005–1.030)
Urobilinogen, UA: 1 mg/dL (ref 0.0–1.0)

## 2014-07-03 LAB — LACTIC ACID, PLASMA: Lactic Acid, Venous: 0.9 mmol/L (ref 0.5–2.2)

## 2014-07-03 LAB — COMPREHENSIVE METABOLIC PANEL
ALBUMIN: 3.4 g/dL — AB (ref 3.5–5.2)
ALK PHOS: 73 U/L (ref 39–117)
ALT: 12 U/L (ref 0–35)
AST: 18 U/L (ref 0–37)
Anion gap: 12 (ref 5–15)
BILIRUBIN TOTAL: 0.5 mg/dL (ref 0.3–1.2)
BUN: 9 mg/dL (ref 6–23)
CHLORIDE: 105 meq/L (ref 96–112)
CO2: 28 mEq/L (ref 19–32)
Calcium: 8.8 mg/dL (ref 8.4–10.5)
Creatinine, Ser: 0.56 mg/dL (ref 0.50–1.10)
GFR calc Af Amer: >90 mL/min (ref >90)
GFR calc non Af Amer: 78 mL/min — ABNORMAL LOW (ref >90)
Glucose, Bld: 131 mg/dL — ABNORMAL HIGH (ref 70–99)
POTASSIUM: 3.7 meq/L (ref 3.7–5.3)
SODIUM: 145 meq/L (ref 137–147)
TOTAL PROTEIN: 6.4 g/dL (ref 6.0–8.3)

## 2014-07-03 LAB — CBC WITH DIFFERENTIAL/PLATELET
BASOS PCT: 1 % (ref 0–1)
Basophils Absolute: 0 10*3/uL (ref 0.0–0.1)
EOS ABS: 0.1 10*3/uL (ref 0.0–0.7)
Eosinophils Relative: 1 % (ref 0–5)
HCT: 41 % (ref 36.0–46.0)
HEMOGLOBIN: 13.9 g/dL (ref 12.0–15.0)
Lymphocytes Relative: 21 % (ref 12–46)
Lymphs Abs: 1.3 10*3/uL (ref 0.7–4.0)
MCH: 30.7 pg (ref 26.0–34.0)
MCHC: 33.9 g/dL (ref 30.0–36.0)
MCV: 90.5 fL (ref 78.0–100.0)
Monocytes Absolute: 0.4 10*3/uL (ref 0.1–1.0)
Monocytes Relative: 7 % (ref 3–12)
NEUTROS ABS: 4.4 10*3/uL (ref 1.7–7.7)
NEUTROS PCT: 70 % (ref 43–77)
Platelets: 160 10*3/uL (ref 150–400)
RBC: 4.53 MIL/uL (ref 3.87–5.11)
RDW: 13.8 % (ref 11.5–15.5)
WBC: 6.2 10*3/uL (ref 4.0–10.5)

## 2014-07-03 LAB — GLUCOSE, CAPILLARY
GLUCOSE-CAPILLARY: 122 mg/dL — AB (ref 70–99)
Glucose-Capillary: 132 mg/dL — ABNORMAL HIGH (ref 70–99)

## 2014-07-03 LAB — HEMOGLOBIN A1C
HEMOGLOBIN A1C: 6.7 % — AB (ref <5.7)
MEAN PLASMA GLUCOSE: 146 mg/dL — AB (ref <117)

## 2014-07-03 LAB — PROTIME-INR
INR: 1.98 — AB (ref 0.00–1.49)
PROTHROMBIN TIME: 22.5 s — AB (ref 11.6–15.2)

## 2014-07-03 LAB — TROPONIN I: Troponin I: <0.3 ng/mL (ref <0.30)

## 2014-07-03 LAB — RAPID URINE DRUG SCREEN, HOSP PERFORMED
Amphetamines: NOT DETECTED
Barbiturates: NOT DETECTED
Benzodiazepines: NOT DETECTED
COCAINE: NOT DETECTED
OPIATES: NOT DETECTED
TETRAHYDROCANNABINOL: NOT DETECTED

## 2014-07-03 LAB — I-STAT TROPONIN, ED: Troponin i, poc: 0 ng/mL (ref 0.00–0.08)

## 2014-07-03 LAB — TSH: TSH: 1.31 u[IU]/mL (ref 0.350–4.500)

## 2014-07-03 MED ORDER — LEVOTHYROXINE SODIUM 100 MCG IV SOLR
56.0000 ug | Freq: Every day | INTRAVENOUS | Status: DC
  Administered 2014-07-03 – 2014-07-04 (×2): 56 ug via INTRAVENOUS
  Filled 2014-07-03 (×2): qty 5

## 2014-07-03 MED ORDER — SODIUM CHLORIDE 0.45 % IV SOLN
INTRAVENOUS | Status: DC
  Administered 2014-07-03 – 2014-07-05 (×2): via INTRAVENOUS
  Administered 2014-07-05: 1000 mL via INTRAVENOUS
  Administered 2014-07-06: 20:00:00 via INTRAVENOUS

## 2014-07-03 MED ORDER — SODIUM CHLORIDE 0.9 % IJ SOLN
3.0000 mL | Freq: Two times a day (BID) | INTRAMUSCULAR | Status: DC
  Administered 2014-07-03 – 2014-07-08 (×6): 3 mL via INTRAVENOUS

## 2014-07-03 MED ORDER — METOPROLOL TARTRATE 1 MG/ML IV SOLN
2.5000 mg | Freq: Four times a day (QID) | INTRAVENOUS | Status: DC
  Administered 2014-07-03 – 2014-07-04 (×4): 2.5 mg via INTRAVENOUS
  Filled 2014-07-03 (×3): qty 5

## 2014-07-03 MED ORDER — INSULIN ASPART 100 UNIT/ML ~~LOC~~ SOLN
0.0000 [IU] | SUBCUTANEOUS | Status: DC
Start: 2014-07-03 — End: 2014-07-04
  Administered 2014-07-03: 21:00:00 via SUBCUTANEOUS
  Administered 2014-07-04: 2 [IU] via SUBCUTANEOUS
  Administered 2014-07-04 (×2): 1 [IU] via SUBCUTANEOUS

## 2014-07-03 MED ORDER — ONDANSETRON HCL 4 MG/2ML IJ SOLN: 4.0000 mg | Freq: Four times a day (QID) | INTRAMUSCULAR | Status: DC | PRN

## 2014-07-03 MED ORDER — LATANOPROST 0.005 % OP SOLN
1.0000 [drp] | Freq: Every day | OPHTHALMIC | Status: DC
  Administered 2014-07-03 – 2014-07-07 (×5): 1 [drp] via OPHTHALMIC
  Filled 2014-07-03: qty 2.5

## 2014-07-03 MED ORDER — DORZOLAMIDE HCL-TIMOLOL MAL 2-0.5 % OP SOLN
1.0000 [drp] | Freq: Two times a day (BID) | OPHTHALMIC | Status: DC
  Administered 2014-07-03 – 2014-07-08 (×10): 1 [drp] via OPHTHALMIC
  Filled 2014-07-03: qty 10

## 2014-07-03 MED ORDER — PANTOPRAZOLE SODIUM 40 MG IV SOLR
20.0000 mg | Freq: Every day | INTRAVENOUS | Status: DC
  Administered 2014-07-03: 20 mg via INTRAVENOUS
  Filled 2014-07-03: qty 40

## 2014-07-03 MED ORDER — HEPARIN (PORCINE) IN NACL 100-0.45 UNIT/ML-% IJ SOLN
800.0000 [IU]/h | INTRAMUSCULAR | Status: DC
Start: 2014-07-03 — End: 2014-07-05
  Administered 2014-07-03: 900 [IU]/h via INTRAVENOUS
  Administered 2014-07-04: 800 [IU]/h via INTRAVENOUS
  Filled 2014-07-03 (×2): qty 250

## 2014-07-03 MED ORDER — ONDANSETRON HCL 4 MG PO TABS: 4.0000 mg | ORAL_TABLET | Freq: Four times a day (QID) | ORAL | Status: DC | PRN

## 2014-07-03 NOTE — H&P (Signed)
[facility] Admission History and Physical Service Pager: [phone]  Patient name: Kathleen Mccarty Medical record number: [id number] Date of birth: 1918-01-25 Age: 80 y.o. Gender: female  Primary Care Provider: HARPER,SCOTT Consultants: neurology Code Status: DNR (confirmed with daughter on admission)  Chief Complaint: altered mental status  Assessment and Plan: Kathleen Mccarty is a 80 y.o. female presenting with acute encephalopathy and apparently inability to speak since about 8:30 this morning. PMH is significant for a-fib (paroxysmal, on warfarin and metorprolol), remote strokes noted on MRI 05/31/14, HTN, HLD, DM type 2, hypothyroidism, CAD (with implanted "heart monitor" in place since 2011, apparently MRI-safe), CHF, and recently-diagnosed depression and 'panic attacks' (pt within the past 2 weeks started taking Zoloft and Ativan from her PCP, though per daughter has taken very few doses of 0.5 mg Ativan, and none recently).  DDx broad, including stroke (esp given sudden onset and persistence of symptoms, though recent MRI at [facility] showed only remote strokes), seizure (recent EEG normal, but perhaps status non-convulsive seizure), infectious (unlikely given no fever, no tachycardia, normal O2 sat on RA, unimpressive though limited physical exam other than neurologic abnormalities, normal labs, no findings on CXR or urine), drug-induced (possibly related to Ativan or Zoloft, but onset was very sudden and witnessed, and pt reportedly not taking much Ativan at all, anyway), metabolic (again without great obvious lab abnormality), endocrine (hx of diabetes but no hypoglycemia, recent equivocal test suggesting hypocortisolism but then in the setting of syncope / near-syncope, which has not recurred), cardiac/cardiogenic (negative POC troponin, no report of chest pain, no change in EKG, and recent grossly normal though limited transcranial carotid ultrasound at  [facility]).  #Acute encephalopathy - working diagnosis of stroke (?subacute, though onset of symptoms abrupt) or seizure - NPO for now - SLP / OT / PT ordered - will culture blood and urine and check UDS - cycle troponins, monitor on telemetry - considered simply ordering MRI (implanted "heart monitor" in place since 2011 and pt had MRI at [facility] in June 2015), but will first consult neurology for further recs, first, as pt may additionally warrant carotid Dopplers, echocardiogram, EEG, etc -- greatly appreciate assistance  #HTN - BP mildly elevated, which may be baseline and/or age-appropriate - only on metoprolol and Imdur at home - hold Imdur, metoprolol IV sub for PO for now - monitor BP, consider hydralazine with parameters if needed  #DM type II - last A1c available several years old; will recheck - CBG's and sensitive SSI q4h while NPO, then change to qAC+HS  CARDIAC #CAD / CHF - no apparent fluid overload on exam or CXR, on metoprolol, Imdur, and Lasix at home #Hx of implantable "cardiac monitor" - uncertain type but NOT pacer / defibrillator, apparently MRI-safe #Atrial fibrillation - on ASA / warfarin at home but subtherapeutic; rate controlled currently #HLD - on Zocor 40 mg at home - hold ASA, statin, Imdur, Lasix while NPO, limit fluids IV as much as possible - metoprolol IV sub for PO, for now, 2.5 mg q6 with titration up if needed - consider echo, carotid dopplers, etc, as above pending neuro recommendations / further change in status - heparin per pharmacy consult while NPO (considered holding given question of stroke, but CT negative)  #Hypothyroidism - last TSH last month WNL, on Synthroid 112 mcg at home - Synthroid 75 mcg IV daily while NPO, recheck TSH  #GERD - hold home Zantac while NPO, continue Protonix IV sub for PO for now  #  Macular degeneration, blindness in left eye - continue home eye drops (Cosopt, Xalatan for home Travatan)  FEN/GI: NPO until swallow eval;  1/2NS @ 100 mL/h Prophylaxis: pharmacy consult for heparin given subtherapeutic INR and inability to take warfarin PO  Disposition: admit to inpt, telemetry bed, attending Dr. McDiarmid, with management as above  History of Present Illness: Kathleen Mccarty is a 80 y.o. female presenting with acute encephalopathy and apparently inability to speak since about 8:30 this morning. PMH is significant for a-fib (paroxysmal, on warfarin and metorprolol), remote strokes noted on MRI 05/31/14, HTN, HLD, DM type 2, hypothyroidism, CAD (with implanted "heart monitor" in place since 2011, apparently MRI-safe), CHF, and recently-diagnosed depression and 'panic attacks' (pt within the past 2 weeks started taking Zoloft and Ativan from her PCP, though per daughter has taken very few doses of 0.5 mg Ativan, and none recently). Note pt has preferred in the past to get almost all of her care at [facility] and reportedly "doesn't like [facility]," but EMS brought pt here due to possible stroke and no beds were available at [facility] for transfer after decision made to admit, per Dr. Tamera Punt (ED physician).  Pt reportedly was in her normal state of health last night and this morning until about 8:30, at which time she was getting out of bed with help from a 24-hour live-in caregiver through [employer] Pt said "hello" to the caregiver, then fell back on the bed and has been less responsive since that time, speaking very little or not at all. She does follow some commands and seems to be aware of her surroundings and attends to voices / faces, but does not speak or definitely answer questions appropriately (i.e., she shakes her head know when asked about pain, but when asked "Does it feel like you want to talk and can't," she grunts but not a clear yes or no as though she understood the question). She has had no change in medication other than starting Zoloft and Ativan, as above, though her daughter does state she has a pill she  takes for macular degeneration that she has not been taking due to having difficulty swallowing it due to its size; however, she eats / drinks and takes other medication by mouth without any problems.  Per pt's daughter, she has had periods of weakness / fatigue for which she has been seen at various ED's and at [facility] over the past couple of months, as well as similar episodes as the one described above but not lasting this long, for which she has gotten an MRI and EEG, both of which showed no acute abnormalities (though MRI did show remote lacunar infarcts). Otherwise, she has been "perfectly fine," per the daughter, without fever / chills, change in bowel habits, N/V/D, complaints of abd or chest pain, SOB, rashes, urinary complaints, or coryza-type illness.  Review Of Systems: LEVEL 5 CAVEAT. MOST HISTORY AND HPI OBTAINED FROM PT'S DAUGHTER AND CHART REVIEW. ROS per daughter per HPI. Otherwise 12 point review of systems was performed and was unremarkable.  Patient Active Problem List   Diagnosis Date Noted  . Hypocortisolism 05/10/2014  . Dehydration 05/08/2014  . Near syncope 05/08/2014  . Osteoarthritis- left hip 05/08/2014  . Flash pulmonary edema 11/09/2011  . A-fib 11/09/2011  . DM type 2 (diabetes mellitus, type 2) 11/09/2011  . CAD (coronary artery disease) 11/09/2011  . HTN (hypertension) 11/09/2011  . Hypothyroidism 11/09/2011  . SYNCOPE 07/28/2007  . DIABETES-TYPE 2 05/21/2007  .  CAD 05/21/2007  . LEG PAIN, RIGHT 04/14/2007  . HYPOTHYROIDISM NOS 12/27/2006  . HYPERLIPIDEMIA NEC/NOS 12/27/2006  . HYPERTENSION 12/27/2006  . ALLERGIC RHINITIS 12/27/2006   Past Medical History: Past Medical History  Diagnosis Date  . Dysrhythmia   . Cancer   . Arthritis   . History of thyroidectomy   . Hypertension   . Hyperlipidemia   . Atrial fibrillation     on coumadin  . CHF (congestive heart failure)   . Stroke   . Diabetes mellitus without complication   . Thyroid  disease     hypo  . Glaucoma    Past Surgical History: Past Surgical History  Procedure Laterality Date  . Breast surgery    . Abdominal hysterectomy     Social History: History  Substance Use Topics  . Smoking status: Never Smoker   . Smokeless tobacco: Never Used  . Alcohol Use: No   Additional social history: Per HPI; lives alone with live-in caregiver who helps with ADL's, meals, bathing, etc. Please also refer to relevant sections of EMR.  Family History: No family history on file. Allergies and Medications: Allergies  Allergen Reactions  . Amoxicillin     "Hard on stomach"  . Cephalosporins     "hard on stomach"  . Codeine     Unsure of reaction, may have been rash or may have been confusion  . Eggs Or Egg-Derived Products Swelling    Whole body swelling can't have raw eggs  . Iodine     Unknown reaction   . Shellfish Allergy Swelling    Whole body swells   . Adhesive [Tape] Rash    Sensitive to bandages   No current facility-administered medications on file prior to encounter.   Current Outpatient Prescriptions on File Prior to Encounter  Medication Sig Dispense Refill  . acetaminophen (TYLENOL) 500 MG tablet Take 500 mg by mouth every 6 (six) hours as needed for mild pain (For pain.).       Marland Kitchen aspirin EC 81 MG tablet Take 81 mg by mouth daily after supper.       . docusate sodium (COLACE) 100 MG capsule Take 100 mg by mouth every other day.      . dorzolamide-timolol (COSOPT) 22.3-6.8 MG/ML ophthalmic solution Place 1 drop into the left eye 2 (two) times daily.       . furosemide (LASIX) 20 MG tablet Take 20 mg by mouth every morning.       . isosorbide mononitrate (IMDUR) 60 MG 24 hr tablet Take 30 mg by mouth daily after supper.       . levothyroxine (SYNTHROID, LEVOTHROID) 112 MCG tablet Take 112 mcg by mouth daily before breakfast.       . metoprolol succinate (TOPROL-XL) 50 MG 24 hr tablet Take 1 tablet (50 mg total) by mouth daily after breakfast. Take  with or immediately following a meal.  30 tablet  0  . Multiple Vitamins-Minerals (ICAPS MV) TABS Take 2 tablets by mouth 2 (two) times daily.       . pantoprazole (PROTONIX) 20 MG tablet Take 20 mg by mouth daily.       . simvastatin (ZOCOR) 40 MG tablet Take 40 mg by mouth every morning.       . Travoprost, BAK Free, (TRAVATAN) 0.004 % SOLN ophthalmic solution Place 1 drop into the left eye every evening.       . vitamin B-12 (CYANOCOBALAMIN) 500 MCG tablet Take 500 mcg by mouth every  morning.      . warfarin (COUMADIN) 4 MG tablet Take 2-4 mg by mouth every evening. Take 4 mg (one tablet) on Tuesday, Wednesday, Thursday, Saturday, and Sunday.  Take 2mg  (half of a tablet) on Monday and Friday.      . nitroGLYCERIN (NITROSTAT) 0.4 MG SL tablet Place 0.4 mg under the tongue every 5 (five) minutes as needed for chest pain.         Objective: BP 146/65  Pulse 80  Temp(Src) 98.4 F (36.9 C) (Oral)  Resp 12  SpO2 96% Exam: General: elderly female lying in bed, in NAD, essentially nonverbal (speaks one word, "no," once during interview, when asked if her abdomen hurts, but then nods her head when asked if her belly is tender on exam) HEENT: /AT, PERRLA, MMM, posterior oropharynx clear  No definite cervical lymphadenopathy, no thyroid enlargement / tenderness Cardiovascular: irregularly irregular but a normal rate  loud blowing crescendo / decrescendo murmur loudest at upper sternal borders, without friction rub  Distal pulses intact bilaterally in LE Respiratory: CTAB anteriorally and laterally; pt unable to sit up or roll for posterior auscultation  Normal work of breathing, though pt either unable to take deep breath or unable to follow command for exam Abdomen: obese, soft, not clearly tender (jumps slightly with palpation with answers as above under general), BS+ Extremities: warm, well-perfused; chronic lower-extremity skin changes bilaterally (see below); pulses intact  Mild trace  edema at ankles bilaterally Skin: warm, dry, intact; prominent congested veins in bilateral feet but no obvious stasis dermatitis-type changes  Skin of feet bilaterally very flakey, but without obvious breakdown and no ulceration / bleeding / drainage Neuro: very difficult exam; Unclear if difficulty due to pt's ability to focus / cooperate / follow commands because of physical inability or lack of understanding  Slight tremor of head / neck, though reported at baseline per daughter  Strength 3+/5 bilaterally, with subtle weakness on the left compared to the right in grip and extremity movements  Awake and aware but unclear if truly alert / attending to other people in the room; mostly nonverbal  NOT somnolent or truly lethargic  Unable to test sensation  CN II intact in right eye (blind in left at baseline), VIII and X appear grossly intact  Unable to adequately test CN III, IV, VI, V, VII, XI, and XII due to cooperation issues as above  No GROSS / obvious cranial nerve deficit despite inadequate testing  Unable to perform cerebellar testing, as well  Labs and Imaging: CBC BMET   Recent Labs Lab 07/03/14 1135  WBC 6.2  HGB 13.9  HCT 41.0  PLT 160    Recent Labs Lab 07/03/14 1135  NA 145  K 3.7  CL 105  CO2 28  BUN 9  CREATININE 0.56  GLUCOSE 131*  CALCIUM 8.8      Recent Labs Lab 07/03/14 1135  INR 1.98*   UA 8/2: cloudy but otherwise normal  POC troponin: negative EKG: atrial fibrillation, nonspecific T-wave changes throughout, poor R-wave progression, normal rate; otherwise nonischemic, unchanged from previous  CXR 8/2 @1121 : left-sided implanted device, no effusions; subjective osteopenia per radiologist; no acute cardio-pulmonary abnormality noted  CT Head without contrast, 8/2 @1058 : Diffusely enlarged ventricles, stable atrophy and chronic small vessel changes without acute disease  Emmaline Kluver, MD 07/03/2014, 2:30 PM PGY-3, [facility] pager: [phone], text pages welcome

## 2014-07-03 NOTE — ED Notes (Signed)
Attempted report x1. 

## 2014-07-03 NOTE — ED Provider Notes (Signed)
CSN: [id number]     Arrival date & time 07/03/14  0957 History   First MD Initiated Contact with Patient 07/03/14 1002     Chief Complaint  Patient presents with  . Weakness     (Consider location/radiation/quality/duration/timing/severity/associated sxs/prior Treatment) HPI Comments: PT brought in by EMS for altered MS.  she lives at home with a caretaker. The caretaker says that she got up to urinate during the night and seemed fine. At about 8:30 this morning, she was getting out of bed and the caretaker was helping her. She said hello to the caretaker and then fell back on the bed and has been less responsive since that time. She is awake and will open eyes but has been nonverbal. EMS to report that she did talk to them but was talking quietly. She has not been found to have a unilateral deficit. Per daughter, she's had no recent illnesses. No fevers or vomiting. When asked patient if she is hurting anywhere, she shakes her head no. However she is not verbally answering questions.  Patient is a 80 y.o. female presenting with weakness.  Weakness    Past Medical History  Diagnosis Date  . Dysrhythmia   . Cancer   . Arthritis   . History of thyroidectomy   . Hypertension   . Hyperlipidemia   . Atrial fibrillation     on coumadin  . CHF (congestive heart failure)   . Stroke   . Diabetes mellitus without complication   . Thyroid disease     hypo  . Glaucoma    Past Surgical History  Procedure Laterality Date  . Breast surgery    . Abdominal hysterectomy     No family history on file. History  Substance Use Topics  . Smoking status: Never Smoker   . Smokeless tobacco: Never Used  . Alcohol Use: No   OB History   Grav Para Term Preterm Abortions TAB SAB Ect Mult Living                 Review of Systems  Unable to perform ROS: Patient nonverbal  Neurological: Positive for weakness.      Allergies  Amoxicillin; Cephalosporins; Codeine; Eggs or egg-derived  products; Iodine; Shellfish allergy; and Adhesive  Home Medications   Prior to Admission medications   Medication Sig Start Date End Date Taking? Authorizing Provider  acetaminophen (TYLENOL) 500 MG tablet Take 500 mg by mouth every 6 (six) hours as needed for mild pain (For pain.).    Yes Historical Provider, MD  aspirin EC 81 MG tablet Take 81 mg by mouth daily after supper.    Yes Historical Provider, MD  docusate sodium (COLACE) 100 MG capsule Take 100 mg by mouth every other day.   Yes Historical Provider, MD  dorzolamide-timolol (COSOPT) 22.3-6.8 MG/ML ophthalmic solution Place 1 drop into the left eye 2 (two) times daily.    Yes Historical Provider, MD  furosemide (LASIX) 20 MG tablet Take 20 mg by mouth every morning.    Yes Historical Provider, MD  isosorbide mononitrate (IMDUR) 60 MG 24 hr tablet Take 30 mg by mouth daily after supper.    Yes Historical Provider, MD  levothyroxine (SYNTHROID, LEVOTHROID) 112 MCG tablet Take 112 mcg by mouth daily before breakfast.    Yes Historical Provider, MD  LORazepam (ATIVAN) 1 MG tablet Take 0.5 mg by mouth every 8 (eight) hours as needed for anxiety.  06/20/14  Yes Historical Provider, MD  metoprolol succinate (TOPROL-XL) 50  MG 24 hr tablet Take 1 tablet (50 mg total) by mouth daily after breakfast. Take with or immediately following a meal. 05/10/14  Yes Ripudeep K Rai, MD  Multiple Vitamins-Minerals (ICAPS MV) TABS Take 2 tablets by mouth 2 (two) times daily.    Yes Historical Provider, MD  OVER THE COUNTER MEDICATION Take 10 mLs by mouth daily as needed (Over the counter antacid).   Yes Historical Provider, MD  pantoprazole (PROTONIX) 20 MG tablet Take 20 mg by mouth daily.    Yes Historical Provider, MD  ranitidine (ZANTAC) 150 MG tablet Take 150 mg by mouth every evening.   Yes Historical Provider, MD  sertraline (ZOLOFT) 25 MG tablet Take 25 mg by mouth every morning.  06/20/14  Yes Historical Provider, MD  simethicone (MYLICON) 40 JA/2.5KN  drops Take 40 mg by mouth 4 (four) times daily as needed for flatulence.   Yes Historical Provider, MD  simvastatin (ZOCOR) 40 MG tablet Take 40 mg by mouth every morning.    Yes Historical Provider, MD  Travoprost, BAK Free, (TRAVATAN) 0.004 % SOLN ophthalmic solution Place 1 drop into the left eye every evening.    Yes Historical Provider, MD  vitamin B-12 (CYANOCOBALAMIN) 500 MCG tablet Take 500 mcg by mouth every morning.   Yes Historical Provider, MD  warfarin (COUMADIN) 4 MG tablet Take 2-4 mg by mouth every evening. Take 4 mg (one tablet) on Tuesday, Wednesday, Thursday, Saturday, and Sunday.  Take 2mg  (half of a tablet) on Monday and Friday.   Yes Historical Provider, MD  nitroGLYCERIN (NITROSTAT) 0.4 MG SL tablet Place 0.4 mg under the tongue every 5 (five) minutes as needed for chest pain.     Historical Provider, MD   BP 143/62  Pulse 68  Temp(Src) 98.4 F (36.9 C) (Oral)  Resp 11  SpO2 97% Physical Exam  Constitutional: She is oriented to person, place, and time. She appears well-developed and well-nourished.  HENT:  Head: Normocephalic and atraumatic.  Eyes: Pupils are equal, round, and reactive to light.  Neck: Normal range of motion. Neck supple.  Cardiovascular: Normal rate, regular rhythm and normal heart sounds.   Pulmonary/Chest: Effort normal and breath sounds normal. No respiratory distress. She has no wheezes. She has no rales. She exhibits no tenderness.  Abdominal: Soft. Bowel sounds are normal. There is no tenderness. There is no rebound and no guarding.  Musculoskeletal: Normal range of motion. She exhibits no edema.  Lymphadenopathy:    She has no cervical adenopathy.  Neurological: She is alert and oriented to person, place, and time.  PT is awake with eyes open, nonverbal.  No facial drooping.  Will stick out tongue symmetrically.  Has some weak hand grip bilaterally.  downgoing toes on Babinski bilaterally, withdraws to stimuli bilaterally.  Otherwise, exam  limited.  Skin: Skin is warm and dry. No rash noted.  Psychiatric: She has a normal mood and affect.    ED Course  Procedures (including critical care time) Labs Review Results for orders placed during the hospital encounter of 07/03/14  CBC WITH DIFFERENTIAL      Result Value Ref Range   WBC 6.2  4.0 - 10.5 K/uL   RBC 4.53  3.87 - 5.11 MIL/uL   Hemoglobin 13.9  12.0 - 15.0 g/dL   HCT 41.0  36.0 - 46.0 %   MCV 90.5  78.0 - 100.0 fL   MCH 30.7  26.0 - 34.0 pg   MCHC 33.9  30.0 - 36.0 g/dL  RDW 13.8  11.5 - 15.5 %   Platelets 160  150 - 400 K/uL   Neutrophils Relative % 70  43 - 77 %   Neutro Abs 4.4  1.7 - 7.7 K/uL   Lymphocytes Relative 21  12 - 46 %   Lymphs Abs 1.3  0.7 - 4.0 K/uL   Monocytes Relative 7  3 - 12 %   Monocytes Absolute 0.4  0.1 - 1.0 K/uL   Eosinophils Relative 1  0 - 5 %   Eosinophils Absolute 0.1  0.0 - 0.7 K/uL   Basophils Relative 1  0 - 1 %   Basophils Absolute 0.0  0.0 - 0.1 K/uL  COMPREHENSIVE METABOLIC PANEL      Result Value Ref Range   Sodium 145  137 - 147 mEq/L   Potassium 3.7  3.7 - 5.3 mEq/L   Chloride 105  96 - 112 mEq/L   CO2 28  19 - 32 mEq/L   Glucose, Bld 131 (*) 70 - 99 mg/dL   BUN 9  6 - 23 mg/dL   Creatinine, Ser 0.56  0.50 - 1.10 mg/dL   Calcium 8.8  8.4 - 10.5 mg/dL   Total Protein 6.4  6.0 - 8.3 g/dL   Albumin 3.4 (*) 3.5 - 5.2 g/dL   AST 18  0 - 37 U/L   ALT 12  0 - 35 U/L   Alkaline Phosphatase 73  39 - 117 U/L   Total Bilirubin 0.5  0.3 - 1.2 mg/dL   GFR calc non Af Amer 78 (*) >90 mL/min   GFR calc Af Amer >90  >90 mL/min   Anion gap 12  5 - 15  URINALYSIS, ROUTINE W REFLEX MICROSCOPIC      Result Value Ref Range   Color, Urine YELLOW  YELLOW   APPearance CLOUDY (*) CLEAR   Specific Gravity, Urine 1.012  1.005 - 1.030   pH 8.0  5.0 - 8.0   Glucose, UA NEGATIVE  NEGATIVE mg/dL   Hgb urine dipstick NEGATIVE  NEGATIVE   Bilirubin Urine NEGATIVE  NEGATIVE   Ketones, ur NEGATIVE  NEGATIVE mg/dL   Protein, ur NEGATIVE   NEGATIVE mg/dL   Urobilinogen, UA 1.0  0.0 - 1.0 mg/dL   Nitrite NEGATIVE  NEGATIVE   Leukocytes, UA NEGATIVE  NEGATIVE  PROTIME-INR      Result Value Ref Range   Prothrombin Time 22.5 (*) 11.6 - 15.2 seconds   INR 1.98 (*) 0.00 - 1.49  LACTIC ACID, PLASMA      Result Value Ref Range   Lactic Acid, Venous 0.9  0.5 - 2.2 mmol/L  I-STAT TROPOININ, ED      Result Value Ref Range   Troponin i, poc 0.00  0.00 - 0.08 ng/mL   Comment 3            Dg Chest 2 View  07/03/2014   CLINICAL DATA:  Altered mental status  EXAM: CHEST  2 VIEW  COMPARISON:  Chest CT 05/17/2014, chest radiograph 05/12/2014  FINDINGS: Left-sided electronic device reidentified. Heart size is mildly enlarged with central vascular congestion. Aeration is minimally improved since previously. Persistent left basilar opacity likely in part corresponds to prominent epicardial fat pad as seen previously. No pneumothorax. No pleural effusion. Mild right AC joint degenerative change reidentified. The bones are subjectively osteopenic with mid thoracic kyphosis reidentified.  IMPRESSION: No new acute abnormality.   Electronically Signed   By: Conchita Paris M.D.   On:  07/03/2014 11:54   Ct Head Wo Contrast  07/03/2014   CLINICAL DATA:  [place].  Brief loss of consciousness.  EXAM: CT HEAD WITHOUT CONTRAST  TECHNIQUE: Contiguous axial images were obtained from the base of the skull through the vertex without intravenous contrast.  COMPARISON:  05/12/2014.  FINDINGS: Diffusely enlarged ventricles and subarachnoid spaces. Patchy white matter low density in both cerebral hemispheres. No skull fracture, intracranial hemorrhage or paranasal sinus air-fluid levels.  IMPRESSION: 1. No acute abnormality. 2. Stable atrophy and chronic small vessel white matter ischemic changes.   Electronically Signed   By: Enrique Sack M.D.   On: 07/03/2014 11:35      Imaging Review Dg Chest 2 View  07/03/2014   CLINICAL DATA:  Altered mental status  EXAM: CHEST  2  VIEW  COMPARISON:  Chest CT 05/17/2014, chest radiograph 05/12/2014  FINDINGS: Left-sided electronic device reidentified. Heart size is mildly enlarged with central vascular congestion. Aeration is minimally improved since previously. Persistent left basilar opacity likely in part corresponds to prominent epicardial fat pad as seen previously. No pneumothorax. No pleural effusion. Mild right AC joint degenerative change reidentified. The bones are subjectively osteopenic with mid thoracic kyphosis reidentified.  IMPRESSION: No new acute abnormality.   Electronically Signed   By: Conchita Paris M.D.   On: 07/03/2014 11:54   Ct Head Wo Contrast  07/03/2014   CLINICAL DATA:  [place].  Brief loss of consciousness.  EXAM: CT HEAD WITHOUT CONTRAST  TECHNIQUE: Contiguous axial images were obtained from the base of the skull through the vertex without intravenous contrast.  COMPARISON:  05/12/2014.  FINDINGS: Diffusely enlarged ventricles and subarachnoid spaces. Patchy white matter low density in both cerebral hemispheres. No skull fracture, intracranial hemorrhage or paranasal sinus air-fluid levels.  IMPRESSION: 1. No acute abnormality. 2. Stable atrophy and chronic small vessel white matter ischemic changes.   Electronically Signed   By: Enrique Sack M.D.   On: 07/03/2014 11:35     EKG Interpretation   Date/Time:  Sunday July 03 2014 10:02:35 EDT Ventricular Rate:  79 PR Interval:  194 QRS Duration: 140 QT Interval:  390 QTC Calculation: 447 R Axis:   -11 Text Interpretation:  Atrial fibrillation Left bundle branch block since  last tracing no significant change Confirmed by Avyan Livesay  MD, Marijah Larranaga ([id number])  on 07/03/2014 1:26:38 PM      MDM   Final diagnoses:  Glasgow coma scale total score 13-15    Patient presents with altered mental status. I don't find any focal neurologic deficits. There is no facial swelling. Patient will shake her head or not her head to answer questions but she is nonverbal.  Her CT scans and lab work have been unremarkable. Per the daughter, the patient does not like [facility] and would not have wanted to be transported here. She's requesting the patient be transferred to [facility]. I counseled her with the hospitalist at [facility] who advised that there are no beds available and they cannot accept the transfer. I formed the daughter of this and will consult unassigned medicine for admission. Her primary care physician is in [facility].    Malvin Johns, MD 07/03/14 1330

## 2014-07-03 NOTE — ED Notes (Signed)
Admitting MD at bedside.

## 2014-07-03 NOTE — ED Notes (Signed)
MD at bedside. 

## 2014-07-03 NOTE — Consult Note (Addendum)
Reason for Consult: Mute / minimally responsive Referring Physician: Dr. McDiarmid  CC: AMS  HPI: Kathleen Mccarty is a 80 y.o. female who lives alone in [place] with recently acquired 24-hour per day assistance. All of the history today is obtained from the patient's daughter and a caregiver who accompanies the daughter. The patient is currently mute. Apparently the patient was in her normal state of health last night and was laughing, talking,  and joking with her caregiver. This morning at approximately 8:30 AM the caregiver went to the patient's bedroom to help her get ready for the day. The patient said "good morning" while sitting on the edge of the bed trying to put on her slippers. At that point she suddenly fell back onto the bed and appeared to be unable to speak. EMS was summoned and the patient was brought to the [facility] emergency department. A CT scan of the head was performed which showed no acute abnormalities. The patient has remained mute although she appears alert, and seems to know what's going on around her, occasionally pointing to things that she wants, and using forms of sign language to express herself. At other times she keeps her eyes closed and lies motionless. The patient has an implanted loop recorder for a history of syncope. She has known atrial fibrillation and is on Coumadin. Her INR today was 1.98.   Past Medical History  Diagnosis Date  . Dysrhythmia   . Cancer   . Arthritis   . History of thyroidectomy   . Hypertension   . Hyperlipidemia   . Atrial fibrillation     on coumadin  . CHF (congestive heart failure)   . Stroke   . Diabetes mellitus without complication   . Thyroid disease     hypo  . Glaucoma     Past Surgical History  Procedure Laterality Date  . Breast surgery    . Abdominal hysterectomy      Family history: 3 children alive and well.  Patient does not speak for remaining family history  Social History:  reports that she has never  smoked. She has never used smokeless tobacco. She reports that she does not drink alcohol or use illicit drugs.  Allergies  Allergen Reactions  . Amoxicillin     "Hard on stomach"  . Cephalosporins     "hard on stomach"  . Codeine     Unsure of reaction, may have been rash or may have been confusion  . Eggs Or Egg-Derived Products Swelling    Whole body swelling can't have raw eggs  . Iodine     Unknown reaction   . Shellfish Allergy Swelling    Whole body swells   . Adhesive [Tape] Rash    Sensitive to bandages    Medications:   Current Facility-Administered Medications  Medication Dose Route Frequency Provider Last Rate Last Dose  . 0.45 % sodium chloride infusion   Intravenous Continuous Sharon Mt Street, MD      . dorzolamide-timolol (COSOPT) 22.3-6.8 MG/ML ophthalmic solution 1 drop  1 drop Left Eye BID Sharon Mt Street, MD      . latanoprost (XALATAN) 0.005 % ophthalmic solution 1 drop  1 drop Left Eye QHS Sharon Mt Street, MD      . levothyroxine (SYNTHROID, LEVOTHROID) injection 75 mcg  75 mcg Intravenous Daily Sharon Mt Street, MD      . metoprolol (LOPRESSOR) injection 2.5 mg  2.5 mg Intravenous 4 times per day Emmaline Kluver, MD      .  ondansetron (ZOFRAN) tablet 4 mg  4 mg Oral Q6H PRN Sharon Mt Street, MD       Or  . ondansetron [facility]) injection 4 mg  4 mg Intravenous Q6H PRN Sharon Mt Street, MD      . pantoprazole (PROTONIX) injection 20 mg  20 mg Intravenous Q24H Sharon Mt Street, MD      . sodium chloride 0.9 % injection 3 mL  3 mL Intravenous Q12H Sharon Mt Street, MD       ROS: History obtained from the patient's daughter. The patient is mute and unable to give a review of systems. According to the patient's daughter the patient has had no recent medical concerns.  General ROS: negative for - chills, fatigue, fever, night sweats, weight gain or weight loss Psychological ROS: negative for - behavioral disorder,  hallucinations, memory difficulties, mood swings or suicidal ideation Ophthalmic ROS: negative for - blurry vision, double vision, eye pain or loss of vision ENT ROS: negative for - epistaxis, nasal discharge, oral lesions, sore throat, tinnitus or vertigo Allergy and Immunology ROS: negative for - hives or itchy/watery eyes Hematological and Lymphatic ROS: negative for - bleeding problems, bruising or swollen lymph nodes Endocrine ROS: negative for - galactorrhea, hair pattern changes, polydipsia/polyuria or temperature intolerance Respiratory ROS: negative for - cough, hemoptysis, shortness of breath or wheezing Cardiovascular ROS: negative for - chest pain, dyspnea on exertion, edema or irregular heartbeat Gastrointestinal ROS: negative for - abdominal pain, diarrhea, hematemesis, nausea/vomiting or stool incontinence Genito-Urinary ROS: negative for - dysuria, hematuria, incontinence or urinary frequency/urgency Musculoskeletal ROS: negative for - joint swelling or muscular weakness Neurological ROS: as noted in HPI Dermatological ROS: negative for rash and skin lesion changes   Physical Examination: Blood pressure 141/81, pulse 76, temperature 98.4 F (36.9 C), temperature source Oral, resp. rate 13, SpO2 94.00%.  Mental Status: Alert.  Follows 3 step commands.  Mute.  Responds to all questioning with either nodding her head or motioning, oriented, thought content appropriate.  Speech fluent without evidence of aphasia.  Able to follow 3 step commands without difficulty. Cranial Nerves: II: Discs flat bilaterally; Blinks to bilateral confrontation. Pupils equal, round, reactive to light and accommodation III,IV, VI: right ptosis, extra-ocular motions intact bilaterally V,VII: patient will not cooperate to smile, facial light touch sensation normal bilaterally VIII: hearing normal bilaterally IX,X: gag reflex present XI: bilateral shoulder shrug XII: midline tongue  extension Motor: When I ask the patient to lift her extremities she shakes her head as if she can not.  When I lift her arm above her head she lets it slowly fall down to the bed without hitting her face.  This is bilateral.  She makes no effort with the lower extremities.   Sensory: Pinprick intact throughout, bilaterally Deep Tendon Reflexes: 2+ and symmetric with absent AJ's bilaterally Plantars: Right: upgoing   Left: upgoing but with wihtdrawal Cerebellar: Unable to perform Gait: Unable to test CV: pulses palpable throughout   Laboratory Studies:   Basic Metabolic Panel:  Recent Labs Lab 07/03/14 1135  NA 145  K 3.7  CL 105  CO2 28  GLUCOSE 131*  BUN 9  CREATININE 0.56  CALCIUM 8.8    Liver Function Tests:  Recent Labs Lab 07/03/14 1135  AST 18  ALT 12  ALKPHOS 73  BILITOT 0.5  PROT 6.4  ALBUMIN 3.4*   No results found for this basename: LIPASE, AMYLASE,  in the last 168 hours No results found for this basename: AMMONIA,  in the last 168 hours  CBC:  Recent Labs Lab 07/03/14 1135  WBC 6.2  NEUTROABS 4.4  HGB 13.9  HCT 41.0  MCV 90.5  PLT 160    Cardiac Enzymes: No results found for this basename: CKTOTAL, CKMB, CKMBINDEX, TROPONINI,  in the last 168 hours  BNP: No components found with this basename: POCBNP,   CBG: No results found for this basename: GLUCAP,  in the last 168 hours  Microbiology: Results for orders placed during the hospital encounter of 06/19/14  URINE CULTURE     Status: None   Collection Time    06/19/14 11:14 AM      Result Value Ref Range Status   Specimen Description URINE, CLEAN CATCH   Final   Special Requests NONE   Final   Culture  Setup Time     Final   Value: 06/19/2014 19:51     Performed at [facility]     Final   Value: 50,000 COLONIES/ML     Performed at [facility]   Culture     Final   Value: Multiple bacterial morphotypes present, none predominant. Suggest  appropriate recollection if clinically indicated.     Performed at [facility]   Report Status 06/20/2014 FINAL   Final    Coagulation Studies:  Recent Labs  07/03/14 1135  LABPROT 22.5*  INR 1.98*    Urinalysis:  Recent Labs Lab 07/03/14 1156  COLORURINE YELLOW  LABSPEC 1.012  PHURINE 8.0  GLUCOSEU NEGATIVE  HGBUR NEGATIVE  BILIRUBINUR NEGATIVE  KETONESUR NEGATIVE  PROTEINUR NEGATIVE  UROBILINOGEN 1.0  NITRITE NEGATIVE  LEUKOCYTESUR NEGATIVE    Lipid Panel:     Component Value Date/Time   CHOL 144 11/10/2011 0200   TRIG 95 11/10/2011 0200   HDL 67 11/10/2011 0200   CHOLHDL 2.1 11/10/2011 0200   VLDL 19 11/10/2011 0200   LDLCALC 58 11/10/2011 0200    HgbA1C:  Lab Results  Component Value Date   HGBA1C 6.3* 11/10/2011    Urine Drug Screen:   No results found for this basename: labopia, cocainscrnur, labbenz, amphetmu, thcu, labbarb    Alcohol Level: No results found for this basename: ETH,  in the last 168 hours  Other results: EKG: Atrial fibrillation with a ventricular response of 79 beats per minute with a left bundle branch block. Please refer to the formal reading for complete details.  Imaging:  Dg Chest 2 View 07/03/2014    No new acute abnormality.    Ct Head Wo Contrast 07/03/2014    1. No acute abnormality.  2. Stable atrophy and chronic small vessel white matter ischemic changes.     Mikey Bussing PA-C [facility] Pager [phone] 07/03/2014, 4:04 PM  Patient seen and examined.  Clinical course and management discussed.  Necessary edits performed.  I agree with the above.  Assessment and plan of care developed and discussed below.     Assessment/Plan: 80 year old female presenting with inability to speak.  Comprehension appears to be intact.  Does not want to cooperate with neurological examination at all times.  Clearly able to do more than she exhibits.  On Coumadin with INR of 1.98.  Head CT reviewed and shows no  acute changes.  Do not expect new ischemic disease at this time but will proceed with further work up.    Recommendations: 1. MRI of the brain without contrast 2. Continue Coumadin 3. Frequent neuro checks  4. Telemetry monitoring    Alexis Goodell, MD [facility] [phone]  07/03/2014  9:39 PM

## 2014-07-03 NOTE — ED Notes (Signed)
Per EMS- pt is from home caregiver witnessed pt fall back onto bed and has been unable to speak. Pt has spoken quietly with EMS. Pt was able to follow commands with EMS. Denies pain. CBG 150. Afib at 71bpm  with history of same

## 2014-07-03 NOTE — Progress Notes (Signed)
ANTICOAGULATION CONSULT NOTE - Initial Consult  Pharmacy Consult for heparin Indication: atrial fibrillation  Allergies  Allergen Reactions  . Amoxicillin     "Hard on stomach"  . Cephalosporins     "hard on stomach"  . Codeine     Unsure of reaction, may have been rash or may have been confusion  . Eggs Or Egg-Derived Products Swelling    Whole body swelling can't have raw eggs  . Iodine     Unknown reaction   . Shellfish Allergy Swelling    Whole body swells   . Adhesive [Tape] Rash    Sensitive to bandages    Patient Measurements: Weight: 164 lb 3.2 oz (74.481 kg) Heparin Dosing Weight: 74kg  Vital Signs: Temp: 98.2 F (36.8 C) (08/02 1606) Temp src: Oral (08/02 1606) BP: 135/51 mmHg (08/02 1606) Pulse Rate: 77 (08/02 1606)  Labs:  Recent Labs  07/03/14 1135  HGB 13.9  HCT 41.0  PLT 160  LABPROT 22.5*  INR 1.98*  CREATININE 0.56    The CrCl is unknown because both a height and weight (above a minimum accepted value) are required for this calculation.   Medical History: Past Medical History  Diagnosis Date  . Dysrhythmia   . Cancer   . Arthritis   . History of thyroidectomy   . Hypertension   . Hyperlipidemia   . Atrial fibrillation     on coumadin  . CHF (congestive heart failure)   . Stroke   . Diabetes mellitus without complication   . Thyroid disease     hypo  . Glaucoma     Medications:  Warfarin 4mg  on Tu/Wed/Th/Sat/Sun, 2mg  on M/F  Assessment: 80 year old female presenting with acute encephalopathy. Patient on chronic warfarin for afib with INR of 1.98 on admission. Patient currently unable to take po medications and orders received to start IV heparin until condition improves. CBC appears normal, no bleeding issues were noted on admission.  Goal of Therapy:  INR 2-3 Heparin level 0.3-0.7 units/ml Monitor platelets by anticoagulation protocol: Yes   Plan:  No heparin bolus with elevated INR Start heparin infusion at 900  units/hr Check anti-Xa level in 8 hours and daily while on heparin Continue to monitor H&H and platelets  Erin Hearing PharmD., BCPS Clinical Pharmacist Pager [phone] 07/03/2014 4:13 PM

## 2014-07-03 NOTE — ED Notes (Signed)
[facility] states that pt was at her baseline when she woke this morning. States that she lost consciousness and fell back onto the bed and did not speak to caregiver. Pt does not speak to Rn.

## 2014-07-04 ENCOUNTER — Inpatient Hospital Stay (HOSPITAL_COMMUNITY): Payer: Medicare HMO

## 2014-07-04 DIAGNOSIS — I4891 Unspecified atrial fibrillation: Secondary | ICD-10-CM

## 2014-07-04 DIAGNOSIS — R4182 Altered mental status, unspecified: Secondary | ICD-10-CM

## 2014-07-04 DIAGNOSIS — G459 Transient cerebral ischemic attack, unspecified: Secondary | ICD-10-CM

## 2014-07-04 DIAGNOSIS — E118 Type 2 diabetes mellitus with unspecified complications: Secondary | ICD-10-CM

## 2014-07-04 LAB — GLUCOSE, CAPILLARY
GLUCOSE-CAPILLARY: 105 mg/dL — AB (ref 70–99)
GLUCOSE-CAPILLARY: 109 mg/dL — AB (ref 70–99)
GLUCOSE-CAPILLARY: 108 mg/dL — AB (ref 70–99)
Glucose-Capillary: 124 mg/dL — ABNORMAL HIGH (ref 70–99)
Glucose-Capillary: 128 mg/dL — ABNORMAL HIGH (ref 70–99)
Glucose-Capillary: 151 mg/dL — ABNORMAL HIGH (ref 70–99)

## 2014-07-04 LAB — HEPARIN LEVEL (UNFRACTIONATED)
HEPARIN UNFRACTIONATED: 0.81 [IU]/mL — AB (ref 0.30–0.70)
Heparin Unfractionated: 0.38 IU/mL (ref 0.30–0.70)
Heparin Unfractionated: 0.53 IU/mL (ref 0.30–0.70)

## 2014-07-04 LAB — TROPONIN I: Troponin I: <0.3 ng/mL (ref <0.30)

## 2014-07-04 LAB — CBC
HCT: 41 % (ref 36.0–46.0)
HEMOGLOBIN: 13.6 g/dL (ref 12.0–15.0)
MCH: 30.3 pg (ref 26.0–34.0)
MCHC: 33.2 g/dL (ref 30.0–36.0)
MCV: 91.3 fL (ref 78.0–100.0)
Platelets: 155 10*3/uL (ref 150–400)
RBC: 4.49 MIL/uL (ref 3.87–5.11)
RDW: 13.9 % (ref 11.5–15.5)
WBC: 8.7 10*3/uL (ref 4.0–10.5)

## 2014-07-04 LAB — PROTIME-INR
INR: 2.04 — ABNORMAL HIGH (ref 0.00–1.49)
Prothrombin Time: 23 seconds — ABNORMAL HIGH (ref 11.6–15.2)

## 2014-07-04 LAB — CLOSTRIDIUM DIFFICILE BY PCR: Toxigenic C. Difficile by PCR: NEGATIVE

## 2014-07-04 MED ORDER — PANTOPRAZOLE SODIUM 20 MG PO TBEC
20.0000 mg | DELAYED_RELEASE_TABLET | Freq: Every day | ORAL | Status: DC
  Administered 2014-07-04 – 2014-07-07 (×4): 20 mg via ORAL
  Filled 2014-07-04 (×6): qty 1

## 2014-07-04 MED ORDER — METOPROLOL SUCCINATE ER 25 MG PO TB24
50.0000 mg | ORAL_TABLET | Freq: Every morning | ORAL | Status: DC
  Administered 2014-07-05 – 2014-07-08 (×4): 50 mg via ORAL
  Filled 2014-07-04 (×4): qty 2

## 2014-07-04 MED ORDER — LEVOTHYROXINE SODIUM 112 MCG PO TABS
112.0000 ug | ORAL_TABLET | Freq: Every day | ORAL | Status: DC
  Administered 2014-07-05 – 2014-07-08 (×4): 112 ug via ORAL
  Filled 2014-07-04 (×4): qty 1

## 2014-07-04 MED ORDER — WARFARIN - PHARMACIST DOSING INPATIENT
Freq: Every day | Status: DC
  Administered 2014-07-04: 18:00:00

## 2014-07-04 MED ORDER — NYSTATIN 100000 UNIT/GM EX POWD
Freq: Two times a day (BID) | CUTANEOUS | Status: DC
Start: 2014-07-04 — End: 2014-07-04

## 2014-07-04 MED ORDER — WARFARIN SODIUM 4 MG PO TABS
4.0000 mg | ORAL_TABLET | Freq: Once | ORAL | Status: AC
  Administered 2014-07-04: 4 mg via ORAL
  Filled 2014-07-04: qty 1

## 2014-07-04 MED ORDER — INSULIN ASPART 100 UNIT/ML ~~LOC~~ SOLN
0.0000 [IU] | Freq: Three times a day (TID) | SUBCUTANEOUS | Status: DC
  Administered 2014-07-05 – 2014-07-06 (×3): 1 [IU] via SUBCUTANEOUS
  Administered 2014-07-07: 2 [IU] via SUBCUTANEOUS
  Administered 2014-07-07 – 2014-07-08 (×4): 1 [IU] via SUBCUTANEOUS

## 2014-07-04 MED ORDER — NYSTATIN 100000 UNIT/GM EX POWD
Freq: Two times a day (BID) | CUTANEOUS | Status: DC
  Administered 2014-07-04 – 2014-07-05 (×3): via TOPICAL
  Administered 2014-07-05: 1 g via TOPICAL
  Administered 2014-07-06 – 2014-07-08 (×5): via TOPICAL
  Filled 2014-07-04: qty 15

## 2014-07-04 MED ORDER — HYDRALAZINE HCL 20 MG/ML IJ SOLN: 5.0000 mg | INTRAMUSCULAR | Status: DC | PRN

## 2014-07-04 MED ORDER — ASPIRIN 81 MG PO CHEW
81.0000 mg | CHEWABLE_TABLET | Freq: Every day | ORAL | Status: DC
  Administered 2014-07-04: 81 mg via ORAL
  Filled 2014-07-04 (×2): qty 1

## 2014-07-04 NOTE — H&P (Addendum)
FMTS ATTENDING ADMISSION NOTE Fran Neiswonger,MD I  have seen and examined this patient, reviewed their chart. I have discussed this patient with the resident. I agree with the resident's findings, assessment and care plan.  80 Y/O F with PMX of HTN, hypothyroidism, Afib, CAD, DM2, HLD, was brought in to the hospital for sudden change in mentation. As per her daughter, she communicates verbally normally and ambulates with support at home. Yesterday while trying to get up from her bed, she fell back into her bed and was not able to communicate verbally afterwards. Daughter mentioned she had a stroke in the past which she got adequate treatment. Denies any recent fall or head injury. She was recently treated at [facility] where she got an MRI of her brain which showed remote stroke. This morning patient communicates well, she stated she has pain on the right side of her neck. Her daughter feels she communicates better now.  No current facility-administered medications on file prior to encounter.   Current Outpatient Prescriptions on File Prior to Encounter  Medication Sig Dispense Refill  . acetaminophen (TYLENOL) 500 MG tablet Take 500 mg by mouth every 6 (six) hours as needed for mild pain (For pain.).       Marland Kitchen aspirin EC 81 MG tablet Take 81 mg by mouth daily after supper.       . docusate sodium (COLACE) 100 MG capsule Take 100 mg by mouth every other day.      . dorzolamide-timolol (COSOPT) 22.3-6.8 MG/ML ophthalmic solution Place 1 drop into the left eye 2 (two) times daily.       . furosemide (LASIX) 20 MG tablet Take 20 mg by mouth every morning.       . isosorbide mononitrate (IMDUR) 60 MG 24 hr tablet Take 30 mg by mouth daily after supper.       . levothyroxine (SYNTHROID, LEVOTHROID) 112 MCG tablet Take 112 mcg by mouth daily before breakfast.       . metoprolol succinate (TOPROL-XL) 50 MG 24 hr tablet Take 1 tablet (50 mg total) by mouth daily after breakfast. Take with or immediately  following a meal.  30 tablet  0  . Multiple Vitamins-Minerals (ICAPS MV) TABS Take 2 tablets by mouth 2 (two) times daily.       . pantoprazole (PROTONIX) 20 MG tablet Take 20 mg by mouth daily.       . simvastatin (ZOCOR) 40 MG tablet Take 40 mg by mouth every morning.       . Travoprost, BAK Free, (TRAVATAN) 0.004 % SOLN ophthalmic solution Place 1 drop into the left eye every evening.       . vitamin B-12 (CYANOCOBALAMIN) 500 MCG tablet Take 500 mcg by mouth every morning.      . warfarin (COUMADIN) 4 MG tablet Take 2-4 mg by mouth every evening. Take 4 mg (one tablet) on Tuesday, Wednesday, Thursday, Saturday, and Sunday.  Take 2mg  (half of a tablet) on Monday and Friday.      . nitroGLYCERIN (NITROSTAT) 0.4 MG SL tablet Place 0.4 mg under the tongue every 5 (five) minutes as needed for chest pain.        Filed Vitals:   07/04/14 0200 07/04/14 0400 07/04/14 0856 07/04/14 1412  BP: 123/54 139/66 158/79 137/79  Pulse: 75 66 76 75  Temp: 98.8 F (37.1 C) 98.6 F (37 C) 98.1 F (36.7 C) 97.3 F (36.3 C)  TempSrc: Oral Oral Oral Oral  Resp: 18 16 20  20  Height:      Weight:      SpO2: 93% 92% 95% 97%   Exam: Gen: Calm in bed, not in distress. HEENT: EOMI, right sided neck tenderness, ROM intact,neck supple. Neuro: Awake and alert, oriented x 3.++DTR, left sided weakness likely from previous stroke. Gait not assessed. Resp: Air entry equal and clear B/L CV: S1 S 2,irregular RR, no murmur. Abd: Soft, NT/ND, BS+ Ext: No edema, sensitive to touch.  A/P: 80 Y/O F with 1. Change in mental status: Seem to be almost back at baseline this morning.     Etiology unclear, r/o stroke.     CT reviewed negative for acute finding.     MRI recommended if will not affect her implantable device.     Neurology consult recommended.     Speech therapy and PT recommended.     Place fall precaution.  2. Afib: Was on coumadin at home, now on heparin.     Plan to transition to oral anticoagulant.      Monitor on telemetry.  3. DM2: STable on current regimen, monitor CBG,SSI .  4. HTN: Currently off home regimen since NPO.     IV hydralazine for coverage with BP parameter.

## 2014-07-04 NOTE — Evaluation (Signed)
Occupational Therapy Evaluation Patient Details Name: TONNETTE ZWIEBEL MRN: [id number] DOB: 1918-03-26 Today's Date: 07/04/2014    History of Present Illness EDDY TERMINE is a 80 y.o. female who lives alone in [place] with recently acquired 24-hour per day assistance. All of the history today is obtained from the patient's daughter and a caregiver who accompanies the daughter. The patient is currently mute. Apparently the patient was in her normal state of health last night and was laughing, talking,  and joking with her caregiver. This morning at approximately 8:30 AM the caregiver went to the patient's bedroom to help her get ready for the day. The patient said "good morning" while sitting on the edge of the bed trying to put on her slippers. At that point she suddenly fell back onto the bed and appeared to be unable to speak. Pt admitted 8/2.   Clinical Impression   Pt admitted with above. Education provided to pt and her daughter. Pt planning to d/c to SNF for rehab.     Follow Up Recommendations  SNF;Supervision/Assistance - 24 hour    Equipment Recommendations  Other (comment) (tbd)    Recommendations for Other Services       Precautions / Restrictions Precautions Precautions: Fall Restrictions Weight Bearing Restrictions: No      Mobility Bed Mobility Overal bed mobility: Needs Assistance Bed Mobility: Supine to Sit;Sit to Supine     Supine to sit: Min guard Sit to supine: Supervision   General bed mobility comments: Cues for technique.  Transfers Overall transfer level: Needs assistance Equipment used: Rolling walker (2 wheeled) Transfers: Sit to/from Stand Sit to Stand: Min guard         General transfer comment: cues for hand placement/technique.    Balance                                            ADL Overall ADL's : Needs assistance/impaired Eating/Feeding: Supervision/ safety;Sitting   Grooming: Standing;Min guard        Lower Body Bathing: Moderate assistance;Sit to/from stand   Upper Body Dressing : Minimal assistance;Sitting   Lower Body Dressing: Moderate assistance;Sit to/from stand;With adaptive equipment   Toilet Transfer: Min guard;Ambulation;Comfort height toilet;Grab bars;RW;BSC   Toileting- Clothing Manipulation and Hygiene: Min guard;Sit to/from stand       Functional mobility during ADLs: Min guard;Rolling walker General ADL Comments: Pt incontinent during ambulation-bathed off in bathroom and performed LB dressing. OT educated on AE for LB ADLs-pt practiced with reacher and started to use sock aid but did not complete task as she was not interested in sockaid. Educated on use of bag on walker and to sit for LB ADLs. Educated on use of button up shirts as pt has pain in left arm with shoulder flexion and reviewed dressing technique. Educated on use of long handled sponge.     Vision                     Perception     Praxis      Pertinent Vitals/Pain Pain in left shoulder during testing. No pain at end of session.     Hand Dominance Right   Extremity/Trunk Assessment Upper Extremity Assessment Upper Extremity Assessment: LUE deficits/detail LUE Deficits / Details: pain in left shoulder/elbow with shoulder flexion   Lower Extremity Assessment Lower Extremity Assessment: Defer to PT evaluation  Communication Communication Communication: No difficulties   Cognition Arousal/Alertness: Awake/alert Behavior During Therapy: WFL for tasks assessed/performed Overall Cognitive Status: Within Functional Limits for tasks assessed                     General Comments       Exercises       Shoulder Instructions      Home Living Family/patient expects to be discharged to:: Skilled nursing facility Living Arrangements: Other (Comment) (pt lives alone but has 24/7 supervision/assist)                               Additional Comments:  per dtr pt was receiving home OT/PT, dtr and pt reports caregivers can not provide more than minimal assist. pt desired SNF      Prior Functioning/Environment Level of Independence: Needs assistance  Gait / Transfers Assistance Needed: uses RW ADL's / Homemaking Assistance Needed: assist with bathing/dressing        OT Diagnosis: Acute pain;Generalized weakness   OT Problem List: Decreased strength;Decreased activity tolerance;Impaired balance (sitting and/or standing);Decreased knowledge of use of DME or AE;Decreased knowledge of precautions;Pain   OT Treatment/Interventions: Self-care/ADL training;DME and/or AE instruction;Therapeutic activities;Patient/family education;Balance training;Therapeutic exercise    OT Goals(Current goals can be found in the care plan section) Acute Rehab OT Goals Patient Stated Goal: not stated OT Goal Formulation: With patient Time For Goal Achievement: 07/11/14 Potential to Achieve Goals: Good ADL Goals Pt Will Perform Lower Body Dressing: with set-up;with supervision;with caregiver independent in assisting;sit to/from stand;with adaptive equipment Pt Will Transfer to Toilet: with modified independence;ambulating Pt Will Perform Toileting - Clothing Manipulation and hygiene: with modified independence;sit to/from stand  OT Frequency: Min 2X/week   Barriers to D/C:            Co-evaluation              End of Session Equipment Utilized During Treatment: Gait belt;Rolling walker  Activity Tolerance: Patient tolerated treatment well Patient left: in bed;with call bell/phone within reach;with bed alarm set;with family/visitor present   Time: 5498-2641 OT Time Calculation (min): 31 min Charges:  OT General Charges $OT Visit: 1 Procedure OT Evaluation $Initial OT Evaluation Tier I: 1 Procedure OT Treatments $Self Care/Home Management : 8-22 mins G-CodesBenito Mccreedy OTR/L [phone] 07/04/2014, 4:44 PM

## 2014-07-04 NOTE — Progress Notes (Addendum)
While giving patient a bath today; assessed bilateral redness with yellowish discharge under each breast and bilaterally in her groin area; await wound care nurse; requesting interdry dressing application; MD ordering nystatin powder BID until seen.

## 2014-07-04 NOTE — Progress Notes (Addendum)
Subjective: Patient improved this morning.  Awake and alert.  Talking.    Objective: Current vital signs: BP 158/79  Pulse 76  Temp(Src) 98.1 F (36.7 C) (Oral)  Resp 20  Ht 5\' 3"  (1.6 m)  Wt 74.481 kg (164 lb 3.2 oz)  BMI 29.09 kg/m2  SpO2 95% Vital signs in last 24 hours: Temp:  [98.1 F (36.7 C)-98.8 F (37.1 C)] 98.1 F (36.7 C) (08/03 0856) Pulse Rate:  [62-82] 76 (08/03 0856) Resp:  [10-20] 20 (08/03 0856) BP: (122-158)/(51-96) 158/79 mmHg (08/03 0856) SpO2:  [92 %-98 %] 95 % (08/03 0856) Weight:  [74.481 kg (164 lb 3.2 oz)] 74.481 kg (164 lb 3.2 oz) (08/02 1600)  Intake/Output from previous day:   Intake/Output this shift:   Nutritional status: Dysphagia  Neurologic Exam: Mental Status:  Alert. Follows 3 step commands. Responds appropriately to questions.  Fluent.   Cranial Nerves:  II: Discs flat bilaterally; Blinks to bilateral confrontation. Pupils equal, round, reactive to light and accommodation  III,IV, VI: right ptosis, extra-ocular motions intact bilaterally  V,VII: patient will not cooperate to smile, facial light touch sensation normal bilaterally  VIII: hearing normal bilaterally  IX,X: gag reflex present  XI: bilateral shoulder shrug  XII: midline tongue extension  Motor:  Patient lifts both arms at the elbow-right greater than left.  Patient lifts both legs off of the bed, right greater than left.   Sensory: Pinprick intact throughout, bilaterally  Deep Tendon Reflexes: 2+ and symmetric with absent AJ's bilaterally  Plantars:  Right: upgoing    Left: upgoing  Cerebellar:  normal finger-to-nose testing    Lab Results: Basic Metabolic Panel:  Recent Labs Lab 07/03/14 1135  NA 145  K 3.7  CL 105  CO2 28  GLUCOSE 131*  BUN 9  CREATININE 0.56  CALCIUM 8.8    Liver Function Tests:  Recent Labs Lab 07/03/14 1135  AST 18  ALT 12  ALKPHOS 73  BILITOT 0.5  PROT 6.4  ALBUMIN 3.4*   No results found for this basename: LIPASE,  AMYLASE,  in the last 168 hours No results found for this basename: AMMONIA,  in the last 168 hours  CBC:  Recent Labs Lab 07/03/14 1135 07/04/14 0045  WBC 6.2 8.7  NEUTROABS 4.4  --   HGB 13.9 13.6  HCT 41.0 41.0  MCV 90.5 91.3  PLT 160 155    Cardiac Enzymes:  Recent Labs Lab 07/03/14 1625 07/03/14 2152 07/04/14 0330  TROPONINI <0.30 <0.30 <0.30    Lipid Panel: No results found for this basename: CHOL, TRIG, HDL, CHOLHDL, VLDL, LDLCALC,  in the last 168 hours  CBG:  Recent Labs Lab 07/03/14 2019 07/04/14 0014 07/04/14 0451 07/04/14 0747 07/04/14 1147  GLUCAP 132* 124* 128* 105* 151*    Microbiology: Results for orders placed during the hospital encounter of 07/03/14  CULTURE, BLOOD (ROUTINE X 2)     Status: None   Collection Time    07/03/14  4:25 PM      Result Value Ref Range Status   Specimen Description BLOOD RIGHT ARM   Final   Special Requests     Final   Value: BOTTLES DRAWN AEROBIC AND ANAEROBIC 10CCAER,5CCANAN   Culture  Setup Time     Final   Value: 07/03/2014 20:46     Performed at [place]     Final   Value:        BLOOD CULTURE RECEIVED NO GROWTH TO DATE  CULTURE WILL BE HELD FOR 5 DAYS BEFORE ISSUING A FINAL NEGATIVE REPORT     Performed at [facility]   Report Status PENDING   Incomplete  CULTURE, BLOOD (ROUTINE X 2)     Status: None   Collection Time    07/03/14  4:40 PM      Result Value Ref Range Status   Specimen Description BLOOD RIGHT ARM   Final   Special Requests BOTTLES DRAWN AEROBIC ONLY 10CC   Final   Culture  Setup Time     Final   Value: 07/03/2014 20:47     Performed at [facility]   Culture     Final   Value:        BLOOD CULTURE RECEIVED NO GROWTH TO DATE CULTURE WILL BE HELD FOR 5 DAYS BEFORE ISSUING A FINAL NEGATIVE REPORT     Performed at [facility]   Report Status PENDING   Incomplete  CLOSTRIDIUM DIFFICILE BY PCR     Status: None   Collection Time    07/04/14  12:43 AM      Result Value Ref Range Status   C difficile by pcr NEGATIVE  NEGATIVE Final    Coagulation Studies:  Recent Labs  07/03/14 1135 07/04/14 0045  LABPROT 22.5* 23.0*  INR 1.98* 2.04*    Imaging: Dg Chest 2 View  07/03/2014   CLINICAL DATA:  Altered mental status  EXAM: CHEST  2 VIEW  COMPARISON:  Chest CT 05/17/2014, chest radiograph 05/12/2014  FINDINGS: Left-sided electronic device reidentified. Heart size is mildly enlarged with central vascular congestion. Aeration is minimally improved since previously. Persistent left basilar opacity likely in part corresponds to prominent epicardial fat pad as seen previously. No pneumothorax. No pleural effusion. Mild right AC joint degenerative change reidentified. The bones are subjectively osteopenic with mid thoracic kyphosis reidentified.  IMPRESSION: No new acute abnormality.   Electronically Signed   By: Conchita Paris M.D.   On: 07/03/2014 11:54   Ct Head Wo Contrast  07/03/2014   CLINICAL DATA:  [place].  Brief loss of consciousness.  EXAM: CT HEAD WITHOUT CONTRAST  TECHNIQUE: Contiguous axial images were obtained from the base of the skull through the vertex without intravenous contrast.  COMPARISON:  05/12/2014.  FINDINGS: Diffusely enlarged ventricles and subarachnoid spaces. Patchy white matter low density in both cerebral hemispheres. No skull fracture, intracranial hemorrhage or paranasal sinus air-fluid levels.  IMPRESSION: 1. No acute abnormality. 2. Stable atrophy and chronic small vessel white matter ischemic changes.   Electronically Signed   By: Enrique Sack M.D.   On: 07/03/2014 11:35    Medications:  I have reviewed the patient's current medications. Scheduled: . dorzolamide-timolol  1 drop Left Eye BID  . insulin aspart  0-9 Units Subcutaneous 6 times per day  . latanoprost  1 drop Left Eye QHS  . levothyroxine  56 mcg Intravenous Daily  . metoprolol  2.5 mg Intravenous 4 times per day  . pantoprazole (PROTONIX) IV   20 mg Intravenous QHS  . sodium chloride  3 mL Intravenous Q12H  . warfarin  4 mg Oral ONCE-1800  . Warfarin - Pharmacist Dosing Inpatient   Does not apply q1800    Assessment/Plan: Patient improved today.  Now speaking and moving extremities to command.  Remains on Coumadin with a therapeutic INR.    Recommendations: 1.  MRI of the brain pending 2.  Continue Coumadin  Case discussed with son at length   LOS:  1 day   Alexis Goodell, MD [facility] [phone] 07/04/2014  12:27 PM

## 2014-07-04 NOTE — Progress Notes (Signed)
Clinical Social Work Department CLINICAL SOCIAL WORK PLACEMENT NOTE 07/04/2014  Patient:  Kathleen Mccarty, Kathleen Mccarty  Account Number:  [id number] Admit date:  07/03/2014  Clinical Social Worker:  Hunt Oris, Latanya Presser  Date/time:  07/04/2014 12:45 PM  Clinical Social Work is seeking post-discharge placement for this patient at the following level of care:   SKILLED NURSING   (*CSW will update this form in Epic as items are completed)   07/04/2014  Patient/family provided with [place] Department of Clinical Social Work's list of facilities offering this level of care within the geographic area requested by the patient (or if unable, by the patient's family).  07/04/2014  Patient/family informed of their freedom to choose among providers that offer the needed level of care, that participate in Medicare, Medicaid or managed care program needed by the patient, have an available bed and are willing to accept the patient.  07/04/2014  Patient/family informed of [facility]' ownership interest in [facility], as well as of the fact that they are under no obligation to receive care at this facility.  PASARR submitted to EDS on 07/04/2014 PASARR number received on   FL2 transmitted to all facilities in geographic area requested by pt/family on  07/04/2014 FL2 transmitted to all facilities within larger geographic area on   Patient informed that his/her managed care company has contracts with or will negotiate with  certain facilities, including the following:     Patient/family informed of bed offers received:   Patient chooses bed at  Physician recommends and patient chooses bed at    Patient to be transferred to  on   Patient to be transferred to facility by  Patient and family notified of transfer on  Name of family member notified:    The following physician request were entered in Epic:   Additional Comments:  Hunt Oris, MSW, [place]

## 2014-07-04 NOTE — Progress Notes (Addendum)
ANTICOAGULATION CONSULT NOTE - Follow Up Consult  Pharmacy Consult for heparin Indication: atrial fibrillation  Allergies  Allergen Reactions  . Amoxicillin     "Hard on stomach"  . Cephalosporins     "hard on stomach"  . Codeine     Unsure of reaction, may have been rash or may have been confusion  . Eggs Or Egg-Derived Products Swelling    Whole body swelling can't have raw eggs  . Iodine     Unknown reaction   . Shellfish Allergy Swelling    Whole body swells   . Adhesive [Tape] Rash    Sensitive to bandages    Patient Measurements: Height: 5\' 3"  (160 cm) Weight: 164 lb 3.2 oz (74.481 kg) IBW/kg (Calculated) : 52.4 Heparin Dosing Weight: 74kg  Vital Signs: Temp: 98.1 F (36.7 C) (08/03 0856) Temp src: Oral (08/03 0856) BP: 158/79 mmHg (08/03 0856) Pulse Rate: 76 (08/03 0856)  Labs:  Recent Labs  07/03/14 1135 07/03/14 1625 07/03/14 2152 07/04/14 0045 07/04/14 0330 07/04/14 0750  HGB 13.9  --   --  13.6  --   --   HCT 41.0  --   --  41.0  --   --   PLT 160  --   --  155  --   --   LABPROT 22.5*  --   --  23.0*  --   --   INR 1.98*  --   --  2.04*  --   --   HEPARINUNFRC  --   --   --  0.38  --  0.81*  CREATININE 0.56  --   --   --   --   --   TROPONINI  --  <0.30 <0.30  --  <0.30  --     Estimated Creatinine Clearance: 42.4 ml/min (by C-G formula based on Cr of 0.56).   Medical History: Past Medical History  Diagnosis Date  . Dysrhythmia   . Cancer   . Arthritis   . History of thyroidectomy   . Hypertension   . Hyperlipidemia   . Atrial fibrillation     on coumadin  . CHF (congestive heart failure)   . Stroke   . Diabetes mellitus without complication   . Thyroid disease     hypo  . Glaucoma     Medications:  Warfarin 4mg  on Tu/Wed/Th/Sat/Sun, 2mg  on M/F  Assessment: 80 year old female presenting with acute encephalopathy. Patient on chronic warfarin for afib with INR of 1.98 on admission. Patient was transitioned to IV heparin since  she is unable to take oral meds. HL this AM was supra-therapeutic at 0.81. INR trended up slightly to 2.04 despite holding coumadin dose yesterday. CBC appears normal, no bleeding issues noted per RN  Goal of Therapy:  Heparin level 0.3-0.7 units/ml Monitor platelets by anticoagulation protocol: Yes   Plan:  Decrease heparin infusion to 800 units/hr.  Check anti-Xa level in 8 hours and daily while on heparin Continue to monitor H&H and platelets F/u MRI    Albertina Parr, PharmD.  Clinical Pharmacist Pager [phone]   Addendum: Pharmacy consulted to resume coumadin. Despite INR being 2.04 this AM, anticipate it to trend down tomorrow since Coumadin was held yesterday.   Home Coumadin dose: 2 mg on M/F; 4 mg on all other days   Plan: 1) Continue heparin infusion at 800 units/hr 2) Give Coumadin 4 mg x 1 dose today.  3) F/u INR tom. If INR remains > 2, d/c  heparin drip  4) Monitor for s/s of bleeding   Albertina Parr, PharmD.  Clinical Pharmacist Pager [phone]

## 2014-07-04 NOTE — Progress Notes (Signed)
CARE MANAGEMENT NOTE 07/04/2014  Patient:  Kathleen Mccarty, Kathleen Mccarty   Account Number:  [id number]  Date Initiated:  07/04/2014  Documentation initiated by:  Olga Coaster  Subjective/Objective Assessment:   ADMITTED WITH ALTERED MENTAL STATUS     Action/Plan:   CM FOLLOWING FOR DCP   Anticipated DC Date:  07/06/2014   Anticipated DC Plan:  SKILLED NURSING FACILITY  In-house referral  Clinical Social Worker      DC Planning Services  CM consult        Status of service:  In process, will continue to follow Medicare Important Message given?   (If response is "NO", the following Medicare IM given date fields will be blank)  Per UR Regulation:  Reviewed for med. necessity/level of care/duration of stay  Comments:  8/3/2015Mindi Slicker RN,BSN,MHA [phone]

## 2014-07-04 NOTE — Progress Notes (Signed)
Patient ID: Kathleen Mccarty, female   DOB: 09/19/1918, 80 y.o.   MRN: [id number]  Family Medicine Teaching Service Daily Progress Note Intern Pager: [phone]  Patient name: Kathleen Mccarty Medical record number: [id number] Date of birth: [birth year] Age: 80 y.o. Gender: female  Primary Care Provider: HARPER,SCOTT Consultants: Neuro, PT/OT/SLP Code Status: DNR  Pt Overview and Major Events to Date:  8/2- admitted for acute encephalopathy and inability to speak  8/3 - able to verbalize and follow commands  Assessment and Plan: Kathleen Mccarty is a 80 y.o. female presenting with acute encephalopathy and inability to speak. PMH is significant for a-fib, remote strokes noted on MRI 05/31/14, HTN, HLD, DM type 2, hypothyroidism, CAD (with implanted "heart monitor" in place since 2011, apparently MRI-safe), CHF, and recently-diagnosed depression and 'panic attacks'.  #Acute encephalopathy - working diagnosis of stroke (?subacute, though onset of symptoms abrupt) or seizure   - SLP / OT / PT ordered -- was cleared by speech; PT saw patient are recommend continued PT services -discussion with family about SNF by social worker - will culture blood and urine - pending  - cycle troponins, monitor on telemetry  - MRI ordered (implanted "heart monitor" in place since 2011 and pt [facility] in [facility]), per neurology recs -continue to monitor mental status   #HTN - BP mildly elevated, which may be baseline and/or age-appropriate  - only on metoprolol and Imdur at home  - restart Imdur and metoprolol PO  - monitor BP, hydralazine prn with parameters(SBP >160, DBP >110)   #DM type II - A1c 6.7 - CBG's and sensitive SSI q4h while NPO, then change to qAC+HS   CARDIAC  #CAD / CHF - no apparent fluid overload on exam or CXR, on metoprolol, Imdur, and Lasix at home  #Hx of implantable "cardiac monitor" - uncertain type but NOT pacer / defibrillator, apparently MRI-safe  #Atrial  fibrillation - on ASA / warfarin at home but subtherapeutic; rate controlled currently  #HLD - on Zocor 40 mg at home  - will restart ASA, statin, lasix, and Imdur since no longer NPO -limit fluids IV as much as possible  - metoprolol PO 50mg  daily - consider echo, carotid dopplers, etc, as above pending change in status   - heparin per pharmacy consult while patient was NPO but now back on warfarin with heparin taper   #Hypothyroidism - last TSH WNL -Synthroid 112 mcg    #GERD - continue Protonix PO 20mg   #Macular degeneration, blindness in left eye - continue home eye drops (Cosopt, Xalatan for home Travatan)   FEN/GI: Regular diet(soft); 1/2NS @ 100 mL/h  Prophylaxis: warfarin PO with heparin taper  Disposition:  Continue current management plan as above, monitor neuro exams, will plan discharge once improvement (possible SNF even though patient has live in aid)  Subjective:  Patient able to verbalize concerns and speak much better today. Daughter who was at beside agreed that patient has improved speech. She complained about some right sided neck pain. Otherwise denies chest pain, SOB, n/v, and fevers.   Objective: Temp:  [97.3 F (36.3 C)-98.8 F (37.1 C)] 97.3 F (36.3 C) (08/03 1412) Pulse Rate:  [66-77] 75 (08/03 1412) Resp:  [12-20] 20 (08/03 1412) BP: (122-158)/(51-96) 137/79 mmHg (08/03 1412) SpO2:  [92 %-97 %] 97 % (08/03 1412) Weight:  [164 lb 3.2 oz (74.481 kg)] 164 lb 3.2 oz (74.481 kg) (08/02 1600) Physical Exam: General: elderly female lying in bed, in NAD, cooperative,  alert HEENT: [place]/AT, PERRLA,  Dry MM, No definite cervical lymphadenopathy, tender to palpation on R side of neck Cardiovascular: irregularly irregular but a normal rate; loud blowing crescendo / decrescendo murmur loudest at upper sternal borders, without friction rub; Distal pulses intact bilaterally in LE  Respiratory: CTAB anteriorally and laterally; pt unable to sit up or roll for posterior  auscultation  Normal work of breathing, Abdomen: soft, NTND, BS+ , no hepatosplenomegaly Extremities: warm, well-perfused; chronic lower-extremity skin changes bilaterally (see below),area around ankles very tender to palpation. Trace edema in BLE Skin: warm, dry, intact; prominent congested veins in bilateral feet but no obvious stasis dermatitis-type changes Neuro: No gross/ obvious cranial nerve deficit despite inadequate testing, Slight tremor of head /neck, Strength 3+/5 bilaterally, with weakness on the left compared to the right; Awake and aware; sensation intact, Unable to get BLE reflexes due to extreme tenderness  Laboratory:  Recent Labs Lab 07/03/14 1135 07/04/14 0045  WBC 6.2 8.7  HGB 13.9 13.6  HCT 41.0 41.0  PLT 160 155    Recent Labs Lab 07/03/14 1135  NA 145  K 3.7  CL 105  CO2 28  BUN 9  CREATININE 0.56  CALCIUM 8.8  PROT 6.4  BILITOT 0.5  ALKPHOS 73  ALT 12  AST 18  GLUCOSE 131*   TSH 1.310 C. Diff - negative UA 8/2: cloudy but otherwise normal  POC troponin: negative  PT/INR = 23.0/2.04 Heparin level = .81 UDS NEG  Imaging/Diagnostic Tests: EKG: atrial fibrillation, nonspecific T-wave changes throughout, poor R-wave progression, normal rate; otherwise nonischemic, unchanged from previous CXR 8/2 @1121 : left-sided implanted device, no effusions; subjective osteopenia per radiologist; no acute cardio-pulmonary abnormality noted   Dg Chest 2 View  07/03/2014   CLINICAL DATA:  Altered mental status  EXAM: CHEST  2 VIEW  COMPARISON:  Chest CT 05/17/2014, chest radiograph 05/12/2014  FINDINGS: Left-sided electronic device reidentified. Heart size is mildly enlarged with central vascular congestion. Aeration is minimally improved since previously. Persistent left basilar opacity likely in part corresponds to prominent epicardial fat pad as seen previously. No pneumothorax. No pleural effusion. Mild right AC joint degenerative change reidentified. The bones  are subjectively osteopenic with mid thoracic kyphosis reidentified.  IMPRESSION: No new acute abnormality.   Electronically Signed   By: Conchita Paris M.D.   On: 07/03/2014 11:54   Ct Head Wo Contrast  07/03/2014   CLINICAL DATA:  [place].  Brief loss of consciousness.  EXAM: CT HEAD WITHOUT CONTRAST  TECHNIQUE: Contiguous axial images were obtained from the base of the skull through the vertex without intravenous contrast.  COMPARISON:  05/12/2014.  FINDINGS: Diffusely enlarged ventricles and subarachnoid spaces. Patchy white matter low density in both cerebral hemispheres. No skull fracture, intracranial hemorrhage or paranasal sinus air-fluid levels.  IMPRESSION: 1. No acute abnormality. 2. Stable atrophy and chronic small vessel white matter ischemic changes.   Electronically Signed   By: Enrique Sack M.D.   On: 07/03/2014 11:35    [name], DO 07/04/2014, 3:07 PM PGY-1, [place] Intern pager: [phone], text pages welcome

## 2014-07-04 NOTE — Progress Notes (Signed)
Bed offers given to pt and daughter. Pt gladly accepted placement at [facility]. CSW to assist with dc once pt is medically stable.  Hunt Oris, MSW, [place]

## 2014-07-04 NOTE — Progress Notes (Signed)
ANTICOAGULATION CONSULT NOTE - Follow Up Consult  Pharmacy Consult for heparin Indication: atrial fibrillation   Labs:  Recent Labs  07/03/14 1135 07/03/14 1625 07/03/14 2152 07/04/14 0045  HGB 13.9  --   --  13.6  HCT 41.0  --   --  41.0  PLT 160  --   --  155  LABPROT 22.5*  --   --  23.0*  INR 1.98*  --   --  2.04*  HEPARINUNFRC  --   --   --  0.38  CREATININE 0.56  --   --   --   TROPONINI  --  <0.30 <0.30  --     Assessment/Plan:  80yo female therapeutic on heparin with initial dosing for Afib. Will continue gtt at current rate and confirm stable with additional level.   Kathleen Mccarty, PharmD, BCPS  07/04/2014,1:18 AM

## 2014-07-04 NOTE — Progress Notes (Signed)
ANTICOAGULATION CONSULT NOTE - Follow Up Consult  Pharmacy Consult for Heparin Indication: atrial fibrillation  Allergies  Allergen Reactions  . Amoxicillin     "Hard on stomach"  . Cephalosporins     "hard on stomach"  . Codeine     Unsure of reaction, may have been rash or may have been confusion  . Eggs Or Egg-Derived Products Swelling    Whole body swelling can't have raw eggs  . Iodine     Unknown reaction   . Shellfish Allergy Swelling    Whole body swells   . Adhesive [Tape] Rash    Sensitive to bandages    Patient Measurements: Height: 5\' 3"  (160 cm) Weight: 164 lb 3.2 oz (74.481 kg) IBW/kg (Calculated) : 52.4 Heparin Dosing Weight: 68kg  Vital Signs: Temp: 98.4 F (36.9 C) (08/03 1904) Temp src: Oral (08/03 1904) BP: 161/73 mmHg (08/03 1904) Pulse Rate: 89 (08/03 1904)  Labs:  Recent Labs  07/03/14 1135 07/03/14 1625 07/03/14 2152 07/04/14 0045 07/04/14 0330 07/04/14 0750 07/04/14 1909  HGB 13.9  --   --  13.6  --   --   --   HCT 41.0  --   --  41.0  --   --   --   PLT 160  --   --  155  --   --   --   LABPROT 22.5*  --   --  23.0*  --   --   --   INR 1.98*  --   --  2.04*  --   --   --   HEPARINUNFRC  --   --   --  0.38  --  0.81* 0.53  CREATININE 0.56  --   --   --   --   --   --   TROPONINI  --  <0.30 <0.30  --  <0.30  --   --     Estimated Creatinine Clearance: 42.4 ml/min (by C-G formula based on Cr of 0.56).   Medications:  Heparin @ 800 units/hr  Assessment: 89yof continues on heparin for afib. Heparin level is now therapeutic after rate decrease this morning. No bleeding reported.  Goal of Therapy:  Heparin level 0.3-0.7 units/ml Monitor platelets by anticoagulation protocol: Yes   Plan:  1) Continue heparin at 800 units/hr 2) Follow up heparin level in AM  Deboraha Sprang 07/04/2014,7:41 PM

## 2014-07-04 NOTE — Evaluation (Signed)
Physical Therapy Evaluation Patient Details Name: Kathleen Mccarty MRN: [id number] DOB: 04/23/1918 Today's Date: 07/04/2014   History of Present Illness  Kathleen Mccarty is a 80 y.o. female who lives alone in [place] with recently acquired 24-hour per day assistance. All of the history today is obtained from the patient's daughter and a caregiver who accompanies the daughter. The patient is currently mute. Apparently the patient was in her normal state of health last night and was laughing, talking,  and joking with her caregiver. This morning at approximately 8:30 AM the caregiver went to the patient's bedroom to help her get ready for the day. The patient said "good morning" while sitting on the edge of the bed trying to put on her slippers. At that point she suddenly fell back onto the bed and appeared to be unable to speak. Pt admitted 8/2.  Clinical Impression  Pt able to communicate clearly with PT however now presenting with bilat UE/LE weakness. Pt demo's inconsistent MMT vs functional assessment in regard to UEs and LEs. Pt observed eating with SLP however when asked to raise UEs during MMT pt unable. Pt appears self limiting in regard to functional mobility and currently required 2 person assist to transfer to chair. Pt reports that her caregivers can not provide the level of assist she needs and she strongly desires to go to a rehab facility. Spoke with case management regarding pt's request.    Follow Up Recommendations SNF;Supervision/Assistance - 24 hour    Equipment Recommendations  None recommended by PT    Recommendations for Other Services       Precautions / Restrictions Precautions Precautions: Fall Restrictions Weight Bearing Restrictions: No      Mobility  Bed Mobility Overal bed mobility: Needs Assistance Bed Mobility: Rolling;Sidelying to Sit Rolling: Supervision Sidelying to sit: Mod assist;Max assist       General bed mobility comments: pt  initiated rolling however was then unable to complete sitting EOB requiring modA for trunk elevation. pt c/o dizziness upon sitting.  Transfers Overall transfer level: Needs assistance Equipment used: Rolling walker (2 wheeled) Transfers: Sit to/from [facility] Sit to Stand: Max assist;+2 physical assistance Stand pivot transfers: Max assist;+2 physical assistance       General transfer comment: pt initiated transfer well however had strong posterior lean. questionable if pt is self limiting as LE weakness not primary compliant when pt came to ED  Ambulation/Gait                Stairs            Wheelchair Mobility    Modified Rankin (Stroke Patients Only)       Balance Overall balance assessment: Needs assistance Sitting-balance support: Feet supported;Single extremity supported Sitting balance-Leahy Scale: Poor     Standing balance support: Bilateral upper extremity supported Standing balance-Leahy Scale: Poor Standing balance comment: requires physical assist                             Pertinent Vitals/Pain Reports soreness in neck during speaking    Home Living Family/patient expects to be discharged to:: [place]:  (pt lives alone but has 24/7 supervision/assist)               Additional Comments: per dtr pt was receiving home OT/PT, dtr and pt reports caregivers can not provide more than minimal assist. pt desired SNF    Prior Function  Level of Independence: Needs assistance   Gait / Transfers Assistance Needed: uses RW  ADL's / Homemaking Assistance Needed: assist with bathing/dressing        Hand Dominance   Dominant Hand: Right    Extremity/Trunk Assessment   Upper Extremity Assessment: Generalized weakness           Lower Extremity Assessment: Generalized weakness      Cervical / Trunk Assessment: Kyphotic  Communication   Communication: No difficulties   Cognition Arousal/Alertness: Awake/alert Behavior During Therapy: WFL for tasks assessed/performed Overall Cognitive Status:  (pt with inconsistent report of symptoms vs functional observation)                       General Comments      Exercises        Assessment/Plan    PT Assessment Patient needs continued PT services  PT Diagnosis Difficulty walking;Generalized weakness   PT Problem List Decreased strength;Decreased activity tolerance;Decreased balance;Decreased mobility  PT Treatment Interventions DME instruction;Gait training;Functional mobility training;Therapeutic activities;Therapeutic exercise   PT Goals (Current goals can be found in the Care Plan section) Acute Rehab PT Goals Patient Stated Goal: rehab PT Goal Formulation: With patient Time For Goal Achievement: 07/18/14 Potential to Achieve Goals: Good    Frequency Min 3X/week   Barriers to discharge   caregivers can only provide minA, pt currently maxAx2    Co-evaluation               End of Session Equipment Utilized During Treatment: Gait belt Activity Tolerance: Patient limited by fatigue Patient left: in chair;with call bell/phone within reach;with family/visitor present Nurse Communication: Mobility status (RN present to assist with transfer)         Time: [phone] PT Time Calculation (min): 32 min   Charges:   PT Evaluation $Initial PT Evaluation Tier I: 1 Procedure PT Treatments $Therapeutic Activity: 8-22 mins   PT G CodesKingsley Callander 07/04/2014, 10:54 AM  Kittie Plater, PT, DPT Pager #: [phone] Office #: [phone]

## 2014-07-04 NOTE — Progress Notes (Signed)
Clinical Social Work Department BRIEF PSYCHOSOCIAL ASSESSMENT 07/04/2014  Patient:  Kathleen Mccarty, Kathleen Mccarty     Account Number:  [id number]     Admit date:  07/03/2014  Clinical Social Worker:  Valda Lamb  Date/Time:  07/04/2014 12:39 PM  Referred by:  Physician  Date Referred:  07/04/2014 Referred for  SNF Placement   Other Referral:   Interview type:  Family Other interview type:   CSW completed assessment with the pt's daughter Kathleen Mccarty [phone]    PSYCHOSOCIAL DATA Living Status:  ALONE Admitted from facility:   Level of care:   Primary support name:  Kathleen Mccarty [phone] Primary support relationship to patient:  CHILD, ADULT Degree of support available:   Pt lives alone however daughter appears to be actively involved in pt care.    CURRENT CONCERNS Current Concerns  Post-Acute Placement   Other Concerns:    SOCIAL WORK ASSESSMENT / PLAN Covering CSW informed that pt will need ST SNF at discharge.    CSW observed that pt is confused therefore CSW completed the assessment with pt's daughter [name]. Pt's daughter confirmed that she would be agreeable to SNF for pt at discharge. Daughter states that she would prefer placement at [facility] however will be agreeable to [facility] doing a SNF search in [facility].    CSW to submit for insurance auth, complete FL2, submit for a pasrr and provide bed offers when available.   Assessment/plan status:  Psychosocial Support/Ongoing Assessment of Needs Other assessment/ plan:   Information/referral to community resources:   CSW to provide a SNF list and bed offers.    PATIENT'S/FAMILY'S RESPONSE TO PLAN OF CARE: Pt presents confused. CSW completed assessment with pts daughter who is agreeable to CSW's intervention.    CSW to remain following.       Hunt Oris, MSW, [place]

## 2014-07-04 NOTE — Progress Notes (Signed)
Talked to patient with daughter present about DCP; patient has private caregivers at home around the clock but is interested in SNF placement; Soc Worker referral placed; Mindi Slicker RN,BSN,MHA [phone]

## 2014-07-04 NOTE — Progress Notes (Signed)
FMTS ATTENDING  NOTE Kathleen Fiebelkorn,MD I  have seen and examined this patient, reviewed their chart. I have discussed this patient with the resident. I agree with the resident's findings, assessment and care plan. 

## 2014-07-04 NOTE — Evaluation (Signed)
Clinical/Bedside Swallow Evaluation Patient Details  Name: Kathleen Mccarty MRN: [id number] Date of Birth: December 12, 1917  Today's Date: 07/04/2014 Time: 1030-1053 SLP Time Calculation (min): 23 min  Past Medical History:  Past Medical History  Diagnosis Date  . Dysrhythmia   . Cancer   . Arthritis   . History of thyroidectomy   . Hypertension   . Hyperlipidemia   . Atrial fibrillation     on coumadin  . CHF (congestive heart failure)   . Stroke   . Diabetes mellitus without complication   . Thyroid disease     hypo  . Glaucoma    Past Surgical History:  Past Surgical History  Procedure Laterality Date  . Breast surgery    . Abdominal hysterectomy     HPI:  Kathleen Mccarty is a 80 y.o. female who lives alone in [place] with recently acquired 24-hour per day assistance. She was admitted 8/2 due to sudden onset inability to verbally communicate. MRI pending.   Assessment / Plan / Recommendation Clinical Impression  Pt demonstrates adequate airway protection with no overt s/s of aspiration noted at bedside. Prolonged mastication is likely secondary to condition of dentition, and patient reports eating softer solids at home PTA. Recommend that patient initiate a Dys 3 diet and thin liquids with intermittent supervision (diet restrictions as reported by family documented in order and MD was asked to clarify). SLP to follow briefly for tolerance as etiology of symptoms is still being determined. Pt also endorses intermittermittent odynopharygia (none reported during this evaluation), which MD may wish to assess further.    Aspiration Risk  Mild    Diet Recommendation Dysphagia 3 (Mechanical Soft);Thin liquid (pt says no eggs, low sodium)   Liquid Administration via: Cup;Straw Medication Administration: Whole meds with liquid (may need to crush larger pills PRN) Supervision: Patient able to self feed;Intermittent supervision to cue for compensatory strategies Compensations:  Slow rate;Small sips/bites Postural Changes and/or Swallow Maneuvers: Seated upright 90 degrees    Other  Recommendations Oral Care Recommendations: Oral care BID   Follow Up Recommendations  Other (comment) (TBD; none anticipated)    Frequency and Duration min 2x/week  1 week   Pertinent Vitals/Pain n/a    SLP Swallow Goals     Swallow Study Prior Functional Status       General Date of Onset: 07/03/14 HPI: Kathleen Mccarty is a 80 y.o. female who lives alone in [place] with recently acquired 24-hour per day assistance. She was admitted 8/2 due to sudden onset inability to verbally communicate. MRI pending. Type of Study: Bedside swallow evaluation Previous Swallow Assessment: none in chart Diet Prior to this Study: NPO Temperature Spikes Noted: No Respiratory Status: Room air History of Recent Intubation: No Behavior/Cognition: Alert;Cooperative;Pleasant mood Oral Cavity - Dentition: Dentures, top;Poor condition;Missing dentition (missing dentition, poor condition on bottom) Self-Feeding Abilities: Able to feed self Patient Positioning: Upright in chair Baseline Vocal Quality: Clear Volitional Cough: Weak Volitional Swallow: Able to elicit    Oral/Motor/Sensory Function Overall Oral Motor/Sensory Function: Other (comment) (did not follow commands during oral motor exam-appears WFL)   Ice Chips Ice chips: Not tested   Thin Liquid Thin Liquid: Impaired Presentation: Cup;Self Fed;Straw Pharyngeal  Phase Impairments: Multiple swallows (intermittent second swallow)    Nectar Thick Nectar Thick Liquid: Not tested   Honey Thick Honey Thick Liquid: Not tested   Puree Puree: Impaired Presentation: Self Fed;Spoon Pharyngeal Phase Impairments: Multiple swallows (intermittent second swallow)   Solid   GO  Solid: Impaired Presentation: Self Fed Oral Phase Impairments: Impaired mastication        Germain Osgood, M.A. CCC-SLP [phone]  Germain Osgood 07/04/2014,11:16 AM

## 2014-07-05 DIAGNOSIS — R4020 Unspecified coma: Secondary | ICD-10-CM

## 2014-07-05 DIAGNOSIS — K921 Melena: Secondary | ICD-10-CM

## 2014-07-05 DIAGNOSIS — R404 Transient alteration of awareness: Secondary | ICD-10-CM

## 2014-07-05 LAB — BASIC METABOLIC PANEL
Anion gap: 12 (ref 5–15)
BUN: 10 mg/dL (ref 6–23)
CALCIUM: 8.7 mg/dL (ref 8.4–10.5)
CO2: 24 mEq/L (ref 19–32)
Chloride: 107 mEq/L (ref 96–112)
Creatinine, Ser: 0.59 mg/dL (ref 0.50–1.10)
GFR calc Af Amer: 89 mL/min — ABNORMAL LOW (ref >90)
GFR calc non Af Amer: 77 mL/min — ABNORMAL LOW (ref >90)
GLUCOSE: 155 mg/dL — AB (ref 70–99)
Potassium: 3.7 mEq/L (ref 3.7–5.3)
Sodium: 143 mEq/L (ref 137–147)

## 2014-07-05 LAB — CBC
HCT: 39.8 % (ref 36.0–46.0)
HEMATOCRIT: 37 % (ref 36.0–46.0)
Hemoglobin: 13.5 g/dL (ref 12.0–15.0)
Hemoglobin: 12.6 g/dL (ref 12.0–15.0)
MCH: 31.3 pg (ref 26.0–34.0)
MCH: 31.2 pg (ref 26.0–34.0)
MCHC: 33.9 g/dL (ref 30.0–36.0)
MCHC: 34.1 g/dL (ref 30.0–36.0)
MCV: 92.1 fL (ref 78.0–100.0)
MCV: 91.6 fL (ref 78.0–100.0)
PLATELETS: 169 10*3/uL (ref 150–400)
PLATELETS: 159 10*3/uL (ref 150–400)
RBC: 4.32 MIL/uL (ref 3.87–5.11)
RBC: 4.04 MIL/uL (ref 3.87–5.11)
RDW: 14 % (ref 11.5–15.5)
RDW: 13.9 % (ref 11.5–15.5)
WBC: 7.3 10*3/uL (ref 4.0–10.5)
WBC: 7 10*3/uL (ref 4.0–10.5)

## 2014-07-05 LAB — URINE CULTURE: Colony Count: 50000

## 2014-07-05 LAB — GLUCOSE, CAPILLARY
Glucose-Capillary: 112 mg/dL — ABNORMAL HIGH (ref 70–99)
Glucose-Capillary: 143 mg/dL — ABNORMAL HIGH (ref 70–99)
Glucose-Capillary: 125 mg/dL — ABNORMAL HIGH (ref 70–99)
Glucose-Capillary: 117 mg/dL — ABNORMAL HIGH (ref 70–99)

## 2014-07-05 LAB — PROTIME-INR
INR: 1.92 — ABNORMAL HIGH (ref 0.00–1.49)
Prothrombin Time: 22 seconds — ABNORMAL HIGH (ref 11.6–15.2)

## 2014-07-05 MED ORDER — ACETAMINOPHEN 325 MG PO TABS
650.0000 mg | ORAL_TABLET | Freq: Four times a day (QID) | ORAL | Status: DC | PRN
Start: 2014-07-05 — End: 2014-07-08

## 2014-07-05 NOTE — Progress Notes (Signed)
Pt on bedpan, copious amount of bright red blood with several clots in her stool.  Pt asymptomatic, c/o NO pain in her ABD or rectum, no dizziness/lightheadnedness/ nausea/vomiting.  MD paged and came immediately to assess.  Verbal order to stop the heparin gtt at this time.  Pt encouraged to call RN if she develops any of the before stated symptoms.  Will monitor and await new orders.

## 2014-07-05 NOTE — Progress Notes (Signed)
Speech Language Pathology Treatment: Dysphagia  Patient Details Name: Kathleen Mccarty MRN: [id number] DOB: 11-Feb-1918 Today's Date: 07/05/2014 Time: 0757-3225 SLP Time Calculation (min): 11 min  Assessment / Plan / Recommendation Clinical Impression  SLP provided skilled observation of regular textures and thin liquids, with no cues needed for use of general aspiration precautions. Pt reports that regular textures remain too difficult to masticate, although she has been eating softer foods at home PTA. Per patient and daughter report, she appears to be at her baseline level of function, and MRI was negative for acute infarct. No further f/u is recommended at this time.   HPI HPI: Kathleen Mccarty is a 80 y.o. female who lives alone in [place] with recently acquired 24-hour per day assistance. She was admitted 8/2 due to sudden onset inability to verbally communicate. MRI pending.   Pertinent Vitals n/a  SLP Plan  All goals met    Recommendations Diet recommendations: Dysphagia 3 (mechanical soft);Thin liquid Liquids provided via: Cup;Straw Medication Administration: Whole meds with liquid Supervision: Patient able to self feed;Intermittent supervision to cue for compensatory strategies Compensations: Slow rate;Small sips/bites Postural Changes and/or Swallow Maneuvers: Seated upright 90 degrees              Oral Care Recommendations: Oral care BID Follow up Recommendations: None Plan: All goals met    GO      Kathleen Mccarty, M.A. CCC-SLP [phone]  Kathleen Mccarty 07/05/2014, 11:20 AM

## 2014-07-05 NOTE — Progress Notes (Signed)
FMTS ATTENDING  NOTE Kehinde Eniola,MD I  have seen and examined this patient, reviewed their chart. I have discussed this patient with the resident. I agree with the resident's findings, assessment and care plan.  Patient's mental status is back to normal, concern for rectal bleeding, I actually saw large volume of watery stool mixed with blood. Patient denies N/V, no stomach pain. On Coumadin and Heparin for her Afib, this was discontinued this morning. GI consulted. Hg this morning was normal, we will recheck that. While in the room with her this morning, nurse mentioned she had a bout of Vtach, this was when she was been cleaned up, she denies any chest pain or SOB, cardiac strip reviewed with lots of artifacts. BMET added to lab today, continue telemetry monitoring and vital check for now.

## 2014-07-05 NOTE — Progress Notes (Signed)
Patient ID: Kathleen Mccarty, female   DOB: October 29, 1918, 80 y.o.   MRN: [id number]  Family Medicine Teaching Service Daily Progress Note Intern Pager: [phone]  Patient name: Kathleen Mccarty Medical record number: [id number] Date of birth: [birth year] Age: 80 y.o. Gender: female  Primary Care Provider: HARPER,SCOTT Consultants: Neuro, PT/OT/SLP Code Status: DNR  Pt Overview and Major Events to Date:  8/2- admitted for acute encephalopathy and inability to speak  8/3 - able to verbalize and follow commands 8/4 -  BRBPR; Run of VTach (4PVCs) on monitoring this morning  Assessment and Plan: Kathleen Mccarty is a 80 y.o. female presenting with acute encephalopathy and inability to speak. PMH is significant for a-fib, remote strokes noted on MRI 05/31/14, HTN, HLD, DM type 2, hypothyroidism, CAD (with implanted "heart monitor" in place since 2011, apparently MRI-safe), CHF, and recently-diagnosed depression and 'panic attacks'.  #Acute encephalopathy - working diagnosis of stroke (?subacute, though onset of symptoms abrupt) or seizure; patient improved   - SLP / OT / PT ordered -- was cleared by speech; PT saw patient recommend continued PT services  -discussion with family about SNF by Education officer, museum; patient willing to go to [place] - blood Cx NGTD and urine CX needs to be recollected - cycle troponins, monitor on telemetry  - MRI showed no acute findings -continue to monitor mental status  -neuro has signed off on patient  #Hematochezia - likely diverticular bleed -GI consulted; appreciate recs -On CT diverticula were noted -Heparin drip, ASA, and coumadin discontinued at this time -will monitor closely - Recheck CBC   #HTN - BP mildly elevated, which may be baseline and/or age-appropriate  - only on metoprolol and Imdur at home  - metoprolol PO  -Imdur being held at this time  - monitor BP, hydralazine prn with parameters(SBP >160, DBP >110)   #DM type II - A1c 6.7 -  CBG's and sensitive SSI q4h while NPO, then change to qAC+HS   CARDIAC  #CAD / CHF - no apparent fluid overload on exam or CXR, on metoprolol, Imdur, and Lasix at home  #Hx of implantable "cardiac monitor" - uncertain type but NOT pacer / defibrillator, apparently MRI-safe  #Atrial fibrillation - on ASA / warfarin at home but subtherapeutic; rate controlled currently  #HLD - on Zocor 40 mg at home  - will restart ASA, statin, lasix, and Imdur since no longer NPO -limit fluids IV as much as possible  - metoprolol PO 50mg  daily - consider echo, carotid dopplers, etc, as above pending change in status   - heparin per pharmacy consult while patient was NPO but now back on warfarin with heparin taper -Run of VTach (4PVCs) on monitoring this morning   #Hypothyroidism - last TSH WNL -Synthroid 112 mcg    #GERD - continue Protonix PO 20mg   #Macular degeneration, blindness in left eye - continue home eye drops (Cosopt, Xalatan for home Travatan)   FEN/GI: Regular diet(soft); 1/2NS @ 100 mL/h  Prophylaxis: None due to bleed  Disposition:  Continue current management plan as above, monitor neuro exams, monitor for continued bleeding, will plan discharge to SNF once improvement   Subjective:  Upon walking into the room this morning to see patient she had BRBPR with clots and stool mixed in. She was hemodynamically stable and voiced no concerns for pain or light-headedness. Denied nausea or bleeding per rectum before.   Objective: Temp:  [97.3 F (36.3 C)-98.4 F (36.9 C)] 97.7 F (36.5 C) (08/04 0541) Pulse Rate:  [  70-89] 84 (08/04 0541) Resp:  [16-20] 18 (08/04 0541) BP: (137-161)/(65-79) 153/79 mmHg (08/04 0541) SpO2:  [95 %-98 %] 96 % (08/04 0541) Physical Exam: General: elderly female lying in bed, in NAD,  alert HEENT: [place]/AT, MMM, OMI Cardiovascular: irregularly irregular but a normal rate; loud blowing crescendo / decrescendo murmur loudest at upper sternal borders, without  friction rub; Distal pulses intact bilaterally in LE  Respiratory: CTAB anteriorally and laterally; pt unable to sit up or roll for posterior auscultation, normal work of breathing, Abdomen: soft, NTND, BS+ , no hepatosplenomegaly Extremities: warm, well-perfused; chronic lower-extremity skin changes bilaterally (see below),area around ankles very tender to palpation. Trace edema in BLE Skin: warm, dry, intact; prominent congested veins in bilateral feet but no obvious stasis dermatitis-type changes Neuro: No gross/ obvious cranial nerve deficit despite inadequate testing, strength 3+/5 bilaterally, with weakness on the left compared to the right; Awake and aware; sensation intact, Unable to get BLE reflexes due to extreme tenderness Rectal exam: no masses palpated, no hemorrhoids, active bleeding with clots  Laboratory:  CBC Latest Ref Rng 07/05/2014 07/05/2014 07/04/2014  WBC 4.0 - 10.5 K/uL 7.0 7.3 8.7  Hemoglobin 12.0 - 15.0 g/dL 12.6 13.5 13.6  Hematocrit 36.0 - 46.0 % 37.0 39.8 41.0  Platelets 150 - 400 K/uL 159 169 155   BMET    Component Value Date/Time   NA 143 07/05/2014 1100   K 3.7 07/05/2014 1100   CL 107 07/05/2014 1100   CO2 24 07/05/2014 1100   GLUCOSE 155* 07/05/2014 1100   BUN 10 07/05/2014 1100   CREATININE 0.59 07/05/2014 1100   CALCIUM 8.7 07/05/2014 1100   GFRNONAA 77* 07/05/2014 1100   GFRAA 89* 07/05/2014 1100   TSH 1.310 C. Diff - negative UA 8/2: cloudy but otherwise normal  POC troponin: negative  PT/INR = 22.0/1.92 UDS NEG  Imaging/Diagnostic Tests: EKG: atrial fibrillation, nonspecific T-wave changes throughout, poor R-wave progression, normal rate; otherwise nonischemic, unchanged from previous CXR 8/2 @1121 : left-sided implanted device, no effusions; subjective osteopenia per radiologist; no acute cardio-pulmonary abnormality noted   Mr Brain Wo Contrast  07/04/2014   CLINICAL DATA:  Acute encephalopathy. Inability to speak. Known atrial fibrillation. Improving clinical  status.  EXAM: MRI HEAD WITHOUT CONTRAST  TECHNIQUE: Multiplanar, multiecho pulse sequences of the brain and surrounding structures were obtained without intravenous contrast.  COMPARISON:  CT head 07/03/2014.  FINDINGS: No evidence for acute infarction, hemorrhage, mass lesion, hydrocephalus, or extra-axial fluid. Generalized cerebral and cerebellar atrophy. Moderate Chronic microvascular ischemic change affects the periventricular and subcortical white matter. There are scattered lacunes and prominent perivascular spaces. Flow voids are maintained. No chronic hemorrhage. Chronic BILATERAL cerebellar infarcts. No acute findings in the sinuses, mastoids, or orbits. Mild pannus. Good general agreement with prior CT.  IMPRESSION: Age-related changes as described.  No acute intracranial findings.   Electronically Signed   By: Rolla Flatten M.D.   On: 07/04/2014 19:06    [name], DO 07/05/2014, 15:25PM PGY-1, [place] Intern pager: [phone], text pages welcome

## 2014-07-05 NOTE — Progress Notes (Signed)
Subjective: Patient unchanged from yesterday.  Eating breakfast.  Awake and alert.  Communicative.    Objective: Current vital signs: BP 153/79  Pulse 84  Temp(Src) 97.7 F (36.5 C) (Oral)  Resp 18  Ht 5\' 3"  (1.6 m)  Wt 74.481 kg (164 lb 3.2 oz)  BMI 29.09 kg/m2  SpO2 96% Vital signs in last 24 hours: Temp:  [97.3 F (36.3 C)-98.4 F (36.9 C)] 97.7 F (36.5 C) (08/04 0541) Pulse Rate:  [70-89] 84 (08/04 0541) Resp:  [16-20] 18 (08/04 0541) BP: (137-161)/(65-79) 153/79 mmHg (08/04 0541) SpO2:  [95 %-98 %] 96 % (08/04 0541)  Intake/Output from previous day:   Intake/Output this shift: Total I/O In: 480 [P.O.:480] Out: -  Nutritional status: Dysphagia  Neurologic Exam: Mental Status:  Alert. Follows 3 step commands. Responds appropriately to questions. Fluent.  Cranial Nerves:  II: Discs flat bilaterally; Blinks to bilateral confrontation. Pupils equal, round, reactive to light and accommodation  III,IV, VI: right ptosis, extra-ocular motions intact bilaterally  V,VII: patient will not cooperate to smile, facial light touch sensation normal bilaterally  VIII: hearing normal bilaterally  IX,X: gag reflex present  XI: bilateral shoulder shrug  XII: midline tongue extension  Motor:  Patient lifts both arms at the elbow-right greater than left. Patient lifts both legs off of the bed, right greater than left.  Sensory: Pinprick intact throughout, bilaterally  Deep Tendon Reflexes: 2+ and symmetric with absent AJ's bilaterally  Plantars:  Right: upgoing   Left: upgoing  Cerebellar:  normal finger-to-nose testing   Lab Results: Basic Metabolic Panel:  Recent Labs Lab 07/03/14 1135  NA 145  K 3.7  CL 105  CO2 28  GLUCOSE 131*  BUN 9  CREATININE 0.56  CALCIUM 8.8    Liver Function Tests:  Recent Labs Lab 07/03/14 1135  AST 18  ALT 12  ALKPHOS 73  BILITOT 0.5  PROT 6.4  ALBUMIN 3.4*   No results found for this basename: LIPASE, AMYLASE,  in the last  168 hours No results found for this basename: AMMONIA,  in the last 168 hours  CBC:  Recent Labs Lab 07/03/14 1135 07/04/14 0045 07/05/14 0637  WBC 6.2 8.7 7.3  NEUTROABS 4.4  --   --   HGB 13.9 13.6 13.5  HCT 41.0 41.0 39.8  MCV 90.5 91.3 92.1  PLT 160 155 169    Cardiac Enzymes:  Recent Labs Lab 07/03/14 1625 07/03/14 2152 07/04/14 0330  TROPONINI <0.30 <0.30 <0.30    Lipid Panel: No results found for this basename: CHOL, TRIG, HDL, CHOLHDL, VLDL, LDLCALC,  in the last 168 hours  CBG:  Recent Labs Lab 07/04/14 0747 07/04/14 1147 07/04/14 1626 07/04/14 2109 07/05/14 0631  GLUCAP 105* 151* 109* 108* 112*    Microbiology: Results for orders placed during the hospital encounter of 07/03/14  CULTURE, BLOOD (ROUTINE X 2)     Status: None   Collection Time    07/03/14  4:25 PM      Result Value Ref Range Status   Specimen Description BLOOD RIGHT ARM   Final   Special Requests     Final   Value: BOTTLES DRAWN AEROBIC AND ANAEROBIC 10CCAER,5CCANAN   Culture  Setup Time     Final   Value: 07/03/2014 20:46     Performed at [facility]   Culture     Final   Value:        BLOOD CULTURE RECEIVED NO GROWTH TO DATE CULTURE WILL BE  HELD FOR 5 DAYS BEFORE ISSUING A FINAL NEGATIVE REPORT     Performed at [facility]   Report Status PENDING   Incomplete  CULTURE, BLOOD (ROUTINE X 2)     Status: None   Collection Time    07/03/14  4:40 PM      Result Value Ref Range Status   Specimen Description BLOOD RIGHT ARM   Final   Special Requests BOTTLES DRAWN AEROBIC ONLY 10CC   Final   Culture  Setup Time     Final   Value: 07/03/2014 20:47     Performed at [facility]   Culture     Final   Value:        BLOOD CULTURE RECEIVED NO GROWTH TO DATE CULTURE WILL BE HELD FOR 5 DAYS BEFORE ISSUING A FINAL NEGATIVE REPORT     Performed at [facility]   Report Status PENDING   Incomplete  URINE CULTURE     Status: None   Collection Time     07/03/14  6:10 PM      Result Value Ref Range Status   Specimen Description URINE, CLEAN CATCH   Final   Special Requests NONE   Final   Culture  Setup Time     Final   Value: 07/04/2014 01:34     Performed at [place]     Final   Value: 50,000 COLONIES/ML     Performed at [facility]   Culture     Final   Value: Multiple bacterial morphotypes present, none predominant. Suggest appropriate recollection if clinically indicated.     Performed at [facility]   Report Status 07/05/2014 FINAL   Final  CLOSTRIDIUM DIFFICILE BY PCR     Status: None   Collection Time    07/04/14 12:43 AM      Result Value Ref Range Status   C difficile by pcr NEGATIVE  NEGATIVE Final    Coagulation Studies:  Recent Labs  07/03/14 1135 07/04/14 0045  LABPROT 22.5* 23.0*  INR 1.98* 2.04*    Imaging: Dg Chest 2 View  07/03/2014   CLINICAL DATA:  Altered mental status  EXAM: CHEST  2 VIEW  COMPARISON:  Chest CT 05/17/2014, chest radiograph 05/12/2014  FINDINGS: Left-sided electronic device reidentified. Heart size is mildly enlarged with central vascular congestion. Aeration is minimally improved since previously. Persistent left basilar opacity likely in part corresponds to prominent epicardial fat pad as seen previously. No pneumothorax. No pleural effusion. Mild right AC joint degenerative change reidentified. The bones are subjectively osteopenic with mid thoracic kyphosis reidentified.  IMPRESSION: No new acute abnormality.   Electronically Signed   By: Conchita Paris M.D.   On: 07/03/2014 11:54   Ct Head Wo Contrast  07/03/2014   CLINICAL DATA:  [place].  Brief loss of consciousness.  EXAM: CT HEAD WITHOUT CONTRAST  TECHNIQUE: Contiguous axial images were obtained from the base of the skull through the vertex without intravenous contrast.  COMPARISON:  05/12/2014.  FINDINGS: Diffusely enlarged ventricles and subarachnoid spaces. Patchy white matter low density in  both cerebral hemispheres. No skull fracture, intracranial hemorrhage or paranasal sinus air-fluid levels.  IMPRESSION: 1. No acute abnormality. 2. Stable atrophy and chronic small vessel white matter ischemic changes.   Electronically Signed   By: Enrique Sack M.D.   On: 07/03/2014 11:35   Mr Brain Wo Contrast  07/04/2014   CLINICAL DATA:  Acute encephalopathy.  Inability to speak. Known atrial fibrillation. Improving clinical status.  EXAM: MRI HEAD WITHOUT CONTRAST  TECHNIQUE: Multiplanar, multiecho pulse sequences of the brain and surrounding structures were obtained without intravenous contrast.  COMPARISON:  CT head 07/03/2014.  FINDINGS: No evidence for acute infarction, hemorrhage, mass lesion, hydrocephalus, or extra-axial fluid. Generalized cerebral and cerebellar atrophy. Moderate Chronic microvascular ischemic change affects the periventricular and subcortical white matter. There are scattered lacunes and prominent perivascular spaces. Flow voids are maintained. No chronic hemorrhage. Chronic BILATERAL cerebellar infarcts. No acute findings in the sinuses, mastoids, or orbits. Mild pannus. Good general agreement with prior CT.  IMPRESSION: Age-related changes as described.  No acute intracranial findings.   Electronically Signed   By: Rolla Flatten M.D.   On: 07/04/2014 19:06    Medications:  I have reviewed the patient's current medications. Scheduled: . aspirin  81 mg Oral Daily  . dorzolamide-timolol  1 drop Left Eye BID  . insulin aspart  0-9 Units Subcutaneous TID AC & HS  . latanoprost  1 drop Left Eye QHS  . levothyroxine  112 mcg Oral QAC breakfast  . metoprolol succinate  50 mg Oral q morning - 10a  . nystatin   Topical BID  . pantoprazole  20 mg Oral Daily  . sodium chloride  3 mL Intravenous Q12H  . Warfarin - Pharmacist Dosing Inpatient   Does not apply q1800    Assessment/Plan: Patient at baseline.  MRI of the brain reviewed and shows no acute changes.  Patient remains on  Coumadin.    Recommendations: 1.  No evidence of TIA or stroke at this time.  No further neurologic intervention is recommended at this time.  If further questions arise, please call or page at that time.  Thank you for allowing neurology to participate in the care of this patient.    LOS: 2 days   Alexis Goodell, MD [facility] [phone] 07/05/2014  9:22 AM

## 2014-07-05 NOTE — Consult Note (Signed)
WOC wound consult note Reason for Consult: evaluation for antimicrobial wicking fabric to treat intertriginous dermatitis.  Pt reports issues under her breast for some time. New areas in the bilateral groin.  Wound type: intertriginous skin damage/dermatitis with evidence of candida overgrowth. Pressure Ulcer POA: No Wound bed: areas of intense erythema in the bilateral groin and inframammary  Drainage (amount, consistency, odor) none Periwound:intact Dressing procedure/placement/frequency: Antimicrobial wicking fabric (Interdry Ag+) to be placed under the bilateral breast and in the bilateral groin. Change every 5 days. Remove for bath and replace.  Do not wash.  Explained rationale for wicking fabric to the patient and the caregiver in the room  Discussed POC with patient and bedside nurse.  Re consult if needed, will not follow at this time. Thanks  Xander Jutras Kellogg, [place] [phone])

## 2014-07-05 NOTE — Consult Note (Signed)
Unassigned Consult  Reason for Consult: Hematochezia Referring Physician: Family Medicine  Warnell Bureau HPI: This is a 80 year old female admitted for stroke-like symptoms, but her MRI yesterday was negative.  This AM she was using the bedpan and hematochezia was noted.  She is on chronic coumadin for her atrial fibrillation.  A CT scan this past June, which was ordered for RUQ pain, was significant for severe colonic diverticulosis.  Per the Nursing note, the patient did not complain of any abdominal pain with the hematochezia event.  Since the initial bleed she only had one other episode of a lower volume of bleeding.  Currently she does report a mild periumbilical pain.  Her daughter was in the room and she does not think she had a prior colonoscopy in the past.  No prior history of any GI bleeds.  Past Medical History  Diagnosis Date  . Dysrhythmia   . Cancer   . Arthritis   . History of thyroidectomy   . Hypertension   . Hyperlipidemia   . Atrial fibrillation     on coumadin  . CHF (congestive heart failure)   . Stroke   . Diabetes mellitus without complication   . Thyroid disease     hypo  . Glaucoma     Past Surgical History  Procedure Laterality Date  . Breast surgery    . Abdominal hysterectomy      No family history on file.  Social History:  reports that she has never smoked. She has never used smokeless tobacco. She reports that she does not drink alcohol or use illicit drugs.  Allergies:  Allergies  Allergen Reactions  . Amoxicillin     "Hard on stomach"  . Cephalosporins     "hard on stomach"  . Codeine     Unsure of reaction, may have been rash or may have been confusion  . Eggs Or Egg-Derived Products Swelling    Whole body swelling can't have raw eggs  . Iodine     Unknown reaction   . Shellfish Allergy Swelling    Whole body swells   . Adhesive [Tape] Rash    Sensitive to bandages    Medications:  Scheduled: . aspirin  81 mg Oral Daily   . dorzolamide-timolol  1 drop Left Eye BID  . insulin aspart  0-9 Units Subcutaneous TID AC & HS  . latanoprost  1 drop Left Eye QHS  . levothyroxine  112 mcg Oral QAC breakfast  . metoprolol succinate  50 mg Oral q morning - 10a  . nystatin   Topical BID  . pantoprazole  20 mg Oral Daily  . sodium chloride  3 mL Intravenous Q12H  . Warfarin - Pharmacist Dosing Inpatient   Does not apply q1800   Continuous: . sodium chloride 100 mL/hr at 07/05/14 0210  . heparin 800 Units/hr (07/04/14 1856)    Results for orders placed during the hospital encounter of 07/03/14 (from the past 24 hour(s))  GLUCOSE, CAPILLARY     Status: Abnormal   Collection Time    07/04/14 11:47 AM      Result Value Ref Range   Glucose-Capillary 151 (*) 70 - 99 mg/dL   Comment 1 Notify RN     Comment 2 Documented in Chart    GLUCOSE, CAPILLARY     Status: Abnormal   Collection Time    07/04/14  4:26 PM      Result Value Ref Range   Glucose-Capillary 109 (*)  70 - 99 mg/dL   Comment 1 Notify RN     Comment 2 Documented in Chart    HEPARIN LEVEL (UNFRACTIONATED)     Status: None   Collection Time    07/04/14  7:09 PM      Result Value Ref Range   Heparin Unfractionated 0.53  0.30 - 0.70 IU/mL  GLUCOSE, CAPILLARY     Status: Abnormal   Collection Time    07/04/14  9:09 PM      Result Value Ref Range   Glucose-Capillary 108 (*) 70 - 99 mg/dL   Comment 1 Notify RN     Comment 2 Documented in Chart    GLUCOSE, CAPILLARY     Status: Abnormal   Collection Time    07/05/14  6:31 AM      Result Value Ref Range   Glucose-Capillary 112 (*) 70 - 99 mg/dL   Comment 1 Notify RN     Comment 2 Documented in Chart    CBC     Status: None   Collection Time    07/05/14  6:37 AM      Result Value Ref Range   WBC 7.3  4.0 - 10.5 K/uL   RBC 4.32  3.87 - 5.11 MIL/uL   Hemoglobin 13.5  12.0 - 15.0 g/dL   HCT 39.8  36.0 - 46.0 %   MCV 92.1  78.0 - 100.0 fL   MCH 31.3  26.0 - 34.0 pg   MCHC 33.9  30.0 - 36.0 g/dL    RDW 14.0  11.5 - 15.5 %   Platelets 169  150 - 400 K/uL     Dg Chest 2 View  07/03/2014   CLINICAL DATA:  Altered mental status  EXAM: CHEST  2 VIEW  COMPARISON:  Chest CT 05/17/2014, chest radiograph 05/12/2014  FINDINGS: Left-sided electronic device reidentified. Heart size is mildly enlarged with central vascular congestion. Aeration is minimally improved since previously. Persistent left basilar opacity likely in part corresponds to prominent epicardial fat pad as seen previously. No pneumothorax. No pleural effusion. Mild right AC joint degenerative change reidentified. The bones are subjectively osteopenic with mid thoracic kyphosis reidentified.  IMPRESSION: No new acute abnormality.   Electronically Signed   By: Conchita Paris M.D.   On: 07/03/2014 11:54   Ct Head Wo Contrast  07/03/2014   CLINICAL DATA:  [place].  Brief loss of consciousness.  EXAM: CT HEAD WITHOUT CONTRAST  TECHNIQUE: Contiguous axial images were obtained from the base of the skull through the vertex without intravenous contrast.  COMPARISON:  05/12/2014.  FINDINGS: Diffusely enlarged ventricles and subarachnoid spaces. Patchy white matter low density in both cerebral hemispheres. No skull fracture, intracranial hemorrhage or paranasal sinus air-fluid levels.  IMPRESSION: 1. No acute abnormality. 2. Stable atrophy and chronic small vessel white matter ischemic changes.   Electronically Signed   By: Enrique Sack M.D.   On: 07/03/2014 11:35   Mr Brain Wo Contrast  07/04/2014   CLINICAL DATA:  Acute encephalopathy. Inability to speak. Known atrial fibrillation. Improving clinical status.  EXAM: MRI HEAD WITHOUT CONTRAST  TECHNIQUE: Multiplanar, multiecho pulse sequences of the brain and surrounding structures were obtained without intravenous contrast.  COMPARISON:  CT head 07/03/2014.  FINDINGS: No evidence for acute infarction, hemorrhage, mass lesion, hydrocephalus, or extra-axial fluid. Generalized cerebral and cerebellar atrophy.  Moderate Chronic microvascular ischemic change affects the periventricular and subcortical white matter. There are scattered lacunes and prominent perivascular spaces. Flow voids are maintained.  No chronic hemorrhage. Chronic BILATERAL cerebellar infarcts. No acute findings in the sinuses, mastoids, or orbits. Mild pannus. Good general agreement with prior CT.  IMPRESSION: Age-related changes as described.  No acute intracranial findings.   Electronically Signed   By: Rolla Flatten M.D.   On: 07/04/2014 19:06    ROS:  As stated above in the HPI otherwise negative.  Blood pressure 153/79, pulse 84, temperature 97.7 F (36.5 C), temperature source Oral, resp. rate 18, height 5\' 3"  (1.6 m), weight 164 lb 3.2 oz (74.481 kg), SpO2 96.00%.    PE: Gen: NAD, but fearful and weak appearing HEENT:  [place]/AT, EOMI Neck: Supple, no LAD Lungs: CTA Bilaterally CV: RRR without M/G/R ABM: Soft, mild periumbilical tenderness, +BS Ext: No C/C/E Rectal: Maroon colored stool  Assessment/Plan: 1) Probable diverticular bleed. 2) Acute encephalopathy. 3) CAD. 4) HTN.   The patient is hemodynamically stable.  I suspect that she will have some more bleeding, which is typical of a diverticular bleed.  With her age and comorbidities, she is at high risk for a colonoscopy.  I discussed this issue with the patient, her daughter, and son.    Plan: 1) Follow HGB. 2) Transfuse as necessary.  Tyshawna Alarid D 07/05/2014, 9:25 AM

## 2014-07-06 DIAGNOSIS — E119 Type 2 diabetes mellitus without complications: Secondary | ICD-10-CM

## 2014-07-06 DIAGNOSIS — I10 Essential (primary) hypertension: Secondary | ICD-10-CM

## 2014-07-06 LAB — GLUCOSE, CAPILLARY
GLUCOSE-CAPILLARY: 120 mg/dL — AB (ref 70–99)
Glucose-Capillary: 124 mg/dL — ABNORMAL HIGH (ref 70–99)
Glucose-Capillary: 111 mg/dL — ABNORMAL HIGH (ref 70–99)
Glucose-Capillary: 119 mg/dL — ABNORMAL HIGH (ref 70–99)

## 2014-07-06 LAB — CBC
HEMATOCRIT: 37 % (ref 36.0–46.0)
HEMATOCRIT: 36.1 % (ref 36.0–46.0)
Hemoglobin: 12.6 g/dL (ref 12.0–15.0)
Hemoglobin: 12.2 g/dL (ref 12.0–15.0)
MCH: 30.9 pg (ref 26.0–34.0)
MCH: 31.2 pg (ref 26.0–34.0)
MCHC: 34.1 g/dL (ref 30.0–36.0)
MCHC: 33.8 g/dL (ref 30.0–36.0)
MCV: 90.7 fL (ref 78.0–100.0)
MCV: 92.3 fL (ref 78.0–100.0)
PLATELETS: 155 10*3/uL (ref 150–400)
Platelets: 155 10*3/uL (ref 150–400)
RBC: 4.08 MIL/uL (ref 3.87–5.11)
RBC: 3.91 MIL/uL (ref 3.87–5.11)
RDW: 14.1 % (ref 11.5–15.5)
RDW: 14.1 % (ref 11.5–15.5)
WBC: 6.7 10*3/uL (ref 4.0–10.5)
WBC: 7 10*3/uL (ref 4.0–10.5)

## 2014-07-06 LAB — PROTIME-INR
INR: 1.72 — ABNORMAL HIGH (ref 0.00–1.49)
Prothrombin Time: 20.2 seconds — ABNORMAL HIGH (ref 11.6–15.2)

## 2014-07-06 NOTE — Progress Notes (Addendum)
Unassigned patient Cross cover  Subjective: Patient has passed more dark blood on 2 occasions today. She denies having any abdominal pain, nausea or vomiting. She does not feel she can prep for a colonoscopy and is very clear that at her age she does NOT need an operation.   Objective: Vital signs in last 24 hours: Temp:  [97.3 F (36.3 C)-98.6 F (37 C)] 98 F (36.7 C) (08/05 1842) Pulse Rate:  [65-84] 70 (08/05 1842) Resp:  [18] 18 (08/05 1842) BP: (119-168)/(58-97) 119/97 mmHg (08/05 1842) SpO2:  [94 %-98 %] 94 % (08/05 1842) Last BM Date: 07/05/14  Intake/Output from previous day: 08/04 0701 - 08/05 0700 In: 1140 [P.O.:1140] Out: -  Intake/Output this shift:   General appearance: alert, delightful lady, cooperative, appears stated age, no distress and pale Resp: clear to auscultation bilaterally Cardio: irregularly irregular rate and rhythm, S1, S2 normal, no murmur, click, rub or gallop GI: soft, non-tender; bowel sounds normal; no masses,  no organomegaly Extremities: extremities normal, atraumatic, no cyanosis or edema  Lab Results:  Recent Labs  07/05/14 1306 07/06/14 0522 07/06/14 1416  WBC 7.0 6.7 7.0  HGB 12.6 12.6 12.2  HCT 37.0 37.0 36.1  PLT 159 155 155   BMET  Recent Labs  07/05/14 1100  NA 143  K 3.7  CL 107  CO2 24  GLUCOSE 155*  BUN 10  CREATININE 0.59  CALCIUM 8.7    Recent Labs  07/05/14 1100 07/06/14 0522  LABPROT 22.0* 20.2*  INR 1.92* 1.72*    Recent Labs  07/04/14 0043  CDIFFPCR NEGATIVE   Studies/Results: Mr Brain Wo Contrast  07/04/2014   CLINICAL DATA:  Acute encephalopathy. Inability to speak. Known atrial fibrillation. Improving clinical status.  EXAM: MRI HEAD WITHOUT CONTRAST  TECHNIQUE: Multiplanar, multiecho pulse sequences of the brain and surrounding structures were obtained without intravenous contrast.  COMPARISON:  CT head 07/03/2014.  FINDINGS: No evidence for acute infarction, hemorrhage, mass lesion,  hydrocephalus, or extra-axial fluid. Generalized cerebral and cerebellar atrophy. Moderate Chronic microvascular ischemic change affects the periventricular and subcortical white matter. There are scattered lacunes and prominent perivascular spaces. Flow voids are maintained. No chronic hemorrhage. Chronic BILATERAL cerebellar infarcts. No acute findings in the sinuses, mastoids, or orbits. Mild pannus. Good general agreement with prior CT.  IMPRESSION: Age-related changes as described.  No acute intracranial findings.   Electronically Signed   By: Rolla Flatten M.D.   On: 07/04/2014 19:06   Medications: I have reviewed the patient's current medications.  Assessment/Plan: 1) Rectal bleeding thought to be secondary to diverticulosis-hemoglobin is currently stable. Continue to monitor serial CBC's. 2) HTN/Hyperlipidemia. 3) Acute change in mental status-improved.  4) AODM.  LOS: 3 days   Jatara Huettner 07/06/2014, 6:45 PM

## 2014-07-06 NOTE — Progress Notes (Signed)
oob per wlaker to br with no distress. Mod assist. Blood tinged watery stools continue.

## 2014-07-06 NOTE — Progress Notes (Signed)
Physical Therapy Treatment Patient Details Name: SAYLEE SHERRILL MRN: [id number] DOB: [birth year] Today's Date: 07/06/2014    History of Present Illness Kathleen Mccarty is a 80 y.o. female who lives alone in [place] with recently acquired 24-hour per day assistance. All of the history today is obtained from the patient's daughter and a caregiver who accompanies the daughter. The patient is currently mute. Apparently the patient was in her normal state of health last night and was laughing, talking,  and joking with her caregiver. This morning at approximately 8:30 AM the caregiver went to the patient's bedroom to help her get ready for the day. The patient said "good morning" while sitting on the edge of the bed trying to put on her slippers. At that point she suddenly fell back onto the bed and appeared to be unable to speak. Pt admitted 8/2.    PT Comments    Pt progressing well now, moving with minimal or less assistance.  Reporting dizziness with movement that fades when still.  Agree with need for short-term rehab at SNF  Follow Up Recommendations  SNF;Supervision/Assistance - 24 hour     Equipment Recommendations  None recommended by PT    Recommendations for Other Services       Precautions / Restrictions Precautions Precautions: Fall    Mobility  Bed Mobility Overal bed mobility: Needs Assistance Bed Mobility: Supine to Sit;Sit to Supine     Supine to sit: Min guard     General bed mobility comments: Cues for technique.  Transfers Overall transfer level: Needs assistance Equipment used: Rolling walker (2 wheeled) Transfers: Sit to/from Stand Sit to Stand: Min assist         General transfer comment: cues for hand placement/technique.  Ambulation/Gait Ambulation/Gait assistance: Min guard Ambulation Distance (Feet): 65 Feet Assistive device: Rolling walker (2 wheeled) Gait Pattern/deviations: Step-through pattern;Decreased step length -  right;Decreased step length - left;Decreased stride length   Gait velocity interpretation: Below normal speed for age/gender General Gait Details: short equal step length, slow in part due to visual deficits   Stairs            Wheelchair Mobility    Modified Rankin (Stroke Patients Only)       Balance Overall balance assessment: Needs assistance Sitting-balance support: No upper extremity supported Sitting balance-Leahy Scale: Fair Sitting balance - Comments: can also w/shift to unweight and scoot hips forward   Standing balance support: Bilateral upper extremity supported;Single extremity supported Standing balance-Leahy Scale: Poor                      Cognition Arousal/Alertness: Awake/alert Behavior During Therapy: WFL for tasks assessed/performed Overall Cognitive Status: Within Functional Limits for tasks assessed                      Exercises      General Comments        Pertinent Vitals/Pain     Home Living                      Prior Function            PT Goals (current goals can now be found in the care plan section) Acute Rehab PT Goals PT Goal Formulation: With patient Time For Goal Achievement: 07/18/14 Potential to Achieve Goals: Good Progress towards PT goals: Progressing toward goals    Frequency  Min 3X/week    PT Plan Current plan  remains appropriate    Co-evaluation             End of Session   Activity Tolerance: Patient tolerated treatment well Patient left: in chair;with call bell/phone within reach;with family/visitor present     Time: 1000-1027 PT Time Calculation (min): 27 min  Charges:  $Gait Training: 8-22 mins $Therapeutic Activity: 8-22 mins                    G Codes:      Tannen Vandezande, Tessie Fass 07/06/2014, 10:35 AM 07/06/2014  Donnella Sham, PT [phone]  (pager)

## 2014-07-06 NOTE — Progress Notes (Signed)
FMTS ATTENDING  NOTE Mira Balon,MD I  have seen and examined this patient, reviewed their chart. I have discussed this patient with the resident. I agree with the resident's findings, assessment and care plan.Patient continues to be mentally alert and stable. Concern for today is persistent rectal bleed. She has been evaluated by GI, due to medical status not deemed stable for colonoscopy. Plan to continue holding anticoagulant despite Afib. Consider ASA 81 mg qd, continue betablocker. HR wnl. Hgb stable, we will monitor patient overnight.

## 2014-07-06 NOTE — Progress Notes (Signed)
Patient ID: Kathleen Mccarty, female   DOB: 02-03-1918, 80 y.o.   MRN: [id number]  Family Medicine Teaching Service Daily Progress Note Intern Pager: [phone]  Patient name: Kathleen Mccarty Medical record number: [id number] Date of birth: 1918-03-30 Age: 80 y.o. Gender: female  Primary Care Provider: HARPER,SCOTT Consultants: Neuro, PT/OT/SLP Code Status: DNR  Pt Overview and Major Events to Date:  8/2- admitted for acute encephalopathy and inability to speak  8/3 - able to verbalize and follow commands 8/4 -  BRBPR; [place] (4PVCs) on monitoring this morning 8/5 -  Still having Hematochezia  Assessment and Plan: Kathleen Mccarty is a 80 y.o. female who presented with acute encephalopathy and inability to speak. PMH is significant for a-fib, remote strokes noted on MRI 05/31/14, HTN, HLD, DM type 2, hypothyroidism, CAD (with implanted "heart monitor" in place since 2011, apparently MRI-safe), CHF, and recently-diagnosed depression and 'panic attacks'.  #Acute encephalopathy - working diagnosis of stroke (?subacute, though onset of symptoms abrupt) or seizure; patient improved at baseline mental status currently   - SLP / OT / PT ordered -- was cleared by speech; PT saw patient recommend continued PT services  -discussion with family about SNF by Education officer, museum; patient to go to [facility] - blood Cx NGTD and urine CX needs to be recollected - MRI showed no acute findings -continue to monitor mental status  -neuro has signed off on patient  #Hematochezia - likely diverticular bleed -GI consulted; they suggest monitoring patients hemodynamic status and transfuse if needed. -On CT diverticula were noted -Heparin drip, ASA, and coumadin discontinued at this time - discussed no anticoagulation anymore for this patient with family. However, will put her on ASA when bleeding stops. -will monitor closely - Recheck CBC this PM then daily until bleeding stops   #HTN - BP mildly  elevated, which may be baseline and/or age-appropriate  - only on metoprolol and Imdur at home  - metoprolol PO  -Imdur being held at this time  - monitor BP, hydralazine prn with parameters(SBP >160, DBP >110)   #DM type II - A1c 6.7 - qAC+HS   CARDIAC  #CAD / CHF - no apparent fluid overload on exam or CXR, on metoprolol, Imdur, and Lasix at home  #Hx of implantable "cardiac monitor" - uncertain type but NOT pacer / defibrillator, apparently MRI-safe  #Atrial fibrillation - on ASA / warfarin at home but subtherapeutic; rate controlled currently  #HLD - on Zocor 40 mg at home  -on ASA, statin, lasix, and Imdur  -limit fluids IV as much as possible  - metoprolol PO 50mg  daily - consider echo, carotid dopplers, etc, as above pending change in status   - patient not on any anticoagulation at this time for A.fib due to bleed   #Hypothyroidism - last TSH WNL -Synthroid 112 mcg    #GERD - continue Protonix PO 20mg   #Macular degeneration, blindness in left eye - continue home eye drops (Cosopt, Xalatan for home Travatan)   FEN/GI: Regular diet(soft); 1/2NS @ 100 mL/h  Prophylaxis: None due to bleed  Disposition:  Continue current management plan as above, monitor neuro exams, monitor for continued bleeding, will plan discharge to SNF once improvement   Subjective:  Patient doing well this morning.  Is complaining of some dizziness this morning mainly with movement. Also states she had a headache last night that has resolved.   There was mention by patient's daughter and nurse that the patient had another episode of bleeding this  morning. Same appearance as yesterday (BRBPR).   Objective: Temp:  [97.3 F (36.3 C)-98.6 F (37 C)] 98.3 F (36.8 C) (08/05 0533) Pulse Rate:  [69-84] 69 (08/05 0533) Resp:  [18] 18 (08/05 0533) BP: (123-168)/(62-86) 168/86 mmHg (08/05 0533) SpO2:  [94 %-98 %] 97 % (08/05 0533) Physical Exam: General: elderly female lying in bed, in NAD,   alert HEENT: [place]/AT, MMM, OMI Cardiovascular: irregularly irregular but a normal rate; loud blowing crescendo / decrescendo murmur loudest at upper sternal borders, without friction rub; Distal pulses intact bilaterally in LE  Respiratory: CTAB anteriorally and laterally; pt unable to sit up or roll for posterior auscultation, normal work of breathing, Abdomen: soft, ND, BS+ , no hepatosplenomegaly, Tender to palpation in RUQ and LLQ. Extremities: warm, well-perfused; chronic lower-extremity skin changes bilaterally (see below),area around ankles very tender to palpation. Skin: warm, dry, intact; prominent congested veins in bilateral feet but no obvious stasis dermatitis-type changes Neuro: No focal deficits on exam. A&Ox3.  Laboratory:  CBC Latest Ref Rng 07/06/2014 07/05/2014 07/05/2014  WBC 4.0 - 10.5 K/uL 6.7 7.0 7.3  Hemoglobin 12.0 - 15.0 g/dL 12.6 12.6 13.5  Hematocrit 36.0 - 46.0 % 37.0 37.0 39.8  Platelets 150 - 400 K/uL 155 159 169   BMET    Component Value Date/Time   NA 143 07/05/2014 1100   K 3.7 07/05/2014 1100   CL 107 07/05/2014 1100   CO2 24 07/05/2014 1100   GLUCOSE 155* 07/05/2014 1100   BUN 10 07/05/2014 1100   CREATININE 0.59 07/05/2014 1100   CALCIUM 8.7 07/05/2014 1100   GFRNONAA 77* 07/05/2014 1100   GFRAA 89* 07/05/2014 1100   TSH 1.310 C. Diff - negative UA 8/2: cloudy but otherwise normal  PT/INR = 20.2/1.72  Imaging/Diagnostic Tests: EKG: atrial fibrillation, nonspecific T-wave changes throughout, poor R-wave progression, normal rate; otherwise nonischemic, unchanged from previous CXR 8/2 @1121 : left-sided implanted device, no effusions; subjective osteopenia per radiologist; no acute cardio-pulmonary abnormality noted    [name], DO 07/06/2014, 15:25PM PGY-1, [facility] Intern pager: [phone], text pages welcome

## 2014-07-06 NOTE — Progress Notes (Signed)
Bloody stool this am, bright red, clear.

## 2014-07-07 LAB — GLUCOSE, CAPILLARY
GLUCOSE-CAPILLARY: 162 mg/dL — AB (ref 70–99)
GLUCOSE-CAPILLARY: 113 mg/dL — AB (ref 70–99)
GLUCOSE-CAPILLARY: 128 mg/dL — AB (ref 70–99)
Glucose-Capillary: 127 mg/dL — ABNORMAL HIGH (ref 70–99)

## 2014-07-07 LAB — CBC
HEMATOCRIT: 37.5 % (ref 36.0–46.0)
Hemoglobin: 12.4 g/dL (ref 12.0–15.0)
MCH: 30.8 pg (ref 26.0–34.0)
MCHC: 33.1 g/dL (ref 30.0–36.0)
MCV: 93.1 fL (ref 78.0–100.0)
Platelets: 133 10*3/uL — ABNORMAL LOW (ref 150–400)
RBC: 4.03 MIL/uL (ref 3.87–5.11)
RDW: 14.1 % (ref 11.5–15.5)
WBC: 6.5 10*3/uL (ref 4.0–10.5)

## 2014-07-07 LAB — PROTIME-INR
INR: 1.6 — ABNORMAL HIGH (ref 0.00–1.49)
Prothrombin Time: 19.1 seconds — ABNORMAL HIGH (ref 11.6–15.2)

## 2014-07-07 MED ORDER — ASPIRIN EC 81 MG PO TBEC: 81.0000 mg | DELAYED_RELEASE_TABLET | Freq: Every day | ORAL | Status: AC

## 2014-07-07 NOTE — Discharge Summary (Signed)
[facility] Discharge Summary  Patient name: Kathleen Mccarty Medical record number: [id number] Date of birth: [birth year] Age: 80 y.o. Gender: female Date of Admission: 07/03/2014  Date of Discharge: 07/08/14 Admitting Physician: Blane Ohara McDiarmid, MD  Primary Care Provider: HARPER,SCOTT Consultants: Neuro, GI  Indication for Hospitalization: AMS  Discharge Diagnoses/Problem List:  Acute encephalopathy (resolved), possible TIA GI bleed, likely diverticular (resolved) DM type II HTN HLD Hypothyroidism Chronic atrial fibrillation Macular degeneration  Disposition: discharge to SNF  Discharge Condition: Improved  Discharge Exam: BP 129/57  Pulse 80  Temp(Src) 98.2 F (36.8 C) (Oral)  Resp 18  Ht 5\' 3"  (1.6 m)  Wt 164 lb 3.2 oz (74.481 kg)  BMI 29.09 kg/m2  SpO2 94% General: elderly female sitting in chair, in NAD, alert  HEENT: [place]/AT, MMM, OMI  Cardiovascular: irregularly irregular but a normal rate; loud blowing crescendo / decrescendo murmur loudest at upper sternal borders, without friction rub; Distal pulses intact bilaterally in LE  Respiratory: CTAB; normal work of breathing  Abdomen: soft, ND, BS+ , no hepatosplenomegaly, Tender to palpation in LLQ.  Extremities: warm, well-perfused; chronic lower-extremity skin changes bilaterally (see below),area around ankles very tender to palpation.  Skin: warm, dry, intact; prominent congested veins in bilateral feet but no obvious stasis dermatitis-type changes  Neuro: No focal deficits on exam. A&Ox3.  Brief Hospital Course:  Mr. Jake Bathe is a 80 year old female who presented with acute encephalopathy and inability to speak. Past medical history is significant for ACE remote shrugs hypertension HLD DM hypothyroidism CAD CHF. Concerns concerns for possible stroke or contrast and neuro was consulted. Urine cultures were ordered and were contaminated. Blood cultures had no growth to date. CT and MRI were  ordered for patient and showed no acute abnormalities. Chest x-ray also so no acute abnormalities.   On admission patient developed a diverticular bleed. GI was consulted and agreed with acute diverticular bleed. Patient also started having loose stools and C. difficile was ordered. C. difficile was negative. Patient's heparin drip aspirin and Coumadin were all stopped. Serial CBCs were ordered and all showed stable hemoglobin. There was no more gross bright red blood per rectum, and just trace blood on stool on discharge. Patient and family agreed to have no procedures done at this time for patient due to her age.  On day of discharge, pt's Hb has remained stable 11-12 and her vital signs have been stable and she was deemed stable for discharge to SNF. Please see below for details by problem list.  #Acute encephalopathy - working diagnosis of stroke vs TIA (?subacute, though onset of symptoms abrupt) or seizure, though now resolved with no abnormalities on MRI; patient improved at baseline mental status currently  - SLP / OT / PT ordered -- was cleared by speech; PT saw patient recommend continued PT services  - could consider outpatient neurology follow-up  #Hematochezia - likely diverticular bleed; improved. No more BRBPR, only trace blood on stool  -GI consulted and followed patient; they are okay with patient being d/c to SNF since Hb stable  -On CT diverticula were noted - pt discharged with ASA only and NO anticoagulation, at this time  #HTN - BP intermittently elevated, which may be baseline and/or age-appropriate  - continue metorprolol and Imdur at discharge  #DM type II - A1c 6.7; pt was managed with sliding scale insulin while admitted, not on medications at home  CARDIAC  #CAD / CHF - no apparent fluid overload on exam or  CXR, on metoprolol, Imdur, and Lasix at home  #Hx of implantable "cardiac monitor" - uncertain type but NOT pacer / defibrillator, apparently MRI-safe  #Atrial  fibrillation - on ASA / warfarin at home but subtherapeutic; rate controlled currently  #HLD - on Zocor 40 mg at home  - continued ASA, statin, lasix, and Imdur  - continue metoprolol PO 50mg  daily  - could consider echo, carotid dopplers, etc, as above if there are any further mental status changes - patient was discharge on any anticoagulation other than ASA at this time for A.fib due to bleed   #Hypothyroidism - last TSH WNL; continue Synthroid 112 mcg   #GERD - continue Protonix PO 20mg    #Macular degeneration, blindness in left eye - continue home eye drops (Cosopt, Xalatan for home Travatan)    Issues for Follow Up:  -Diverticular bleed: follow Hbg and make sure stable; transfuse as needed -PT/OT  -Follow-up with PCP outpatient  Significant Procedures: None  Significant Labs and Imaging:  CBC Latest Ref Rng 07/08/2014 07/07/2014 07/06/2014  WBC 4.0 - 10.5 K/uL 6.8 6.5 7.0  Hemoglobin 12.0 - 15.0 g/dL 11.5(L) 12.4 12.2  Hematocrit 36.0 - 46.0 % 34.4(L) 37.5 36.1  Platelets 150 - 400 K/uL 141(L) 133(L) 155    Recent Labs Lab 07/03/14 1135 07/05/14 1100  NA 145 143  K 3.7 3.7  CL 105 107  CO2 28 24  GLUCOSE 131* 155*  BUN 9 10  CREATININE 0.56 0.59  CALCIUM 8.8 8.7  ALKPHOS 73  --   AST 18  --   ALT 12  --   ALBUMIN 3.4*  --    TSH 1.31  C. Diff - negative  UA 8/2: cloudy but otherwise normal  PT/INR = 17.4/1.42  Results/Tests Pending at Time of Discharge: None  Discharge Medications:    Medication List    STOP taking these medications       ATIVAN 1 MG tablet  Generic drug:  LORazepam     warfarin 4 MG tablet  Commonly known as:  COUMADIN      TAKE these medications       acetaminophen 500 MG tablet  Commonly known as:  TYLENOL  Take 500 mg by mouth every 6 (six) hours as needed for mild pain (For pain.).     aspirin EC 81 MG tablet  Take 1 tablet (81 mg total) by mouth daily after supper. Restart in 7 days     docusate sodium 100 MG capsule   Commonly known as:  COLACE  Take 100 mg by mouth every other day.     dorzolamide-timolol 22.3-6.8 MG/ML ophthalmic solution  Commonly known as:  COSOPT  Place 1 drop into the left eye 2 (two) times daily.     furosemide 20 MG tablet  Commonly known as:  LASIX  Take 20 mg by mouth every morning.     ICAPS MV Tabs  Take 2 tablets by mouth 2 (two) times daily.     isosorbide mononitrate 60 MG 24 hr tablet  Commonly known as:  IMDUR  Take 30 mg by mouth daily after supper.     levothyroxine 112 MCG tablet  Commonly known as:  SYNTHROID, LEVOTHROID  Take 112 mcg by mouth daily before breakfast.     metoprolol succinate 50 MG 24 hr tablet  Commonly known as:  TOPROL-XL  Take 1 tablet (50 mg total) by mouth daily after breakfast. Take with or immediately following a meal.  nitroGLYCERIN 0.4 MG SL tablet  Commonly known as:  NITROSTAT  Place 0.4 mg under the tongue every 5 (five) minutes as needed for chest pain.     OVER THE COUNTER MEDICATION  Take 10 mLs by mouth daily as needed (Over the counter antacid).     pantoprazole 20 MG tablet  Commonly known as:  PROTONIX  Take 20 mg by mouth daily.     ranitidine 150 MG tablet  Commonly known as:  ZANTAC  Take 150 mg by mouth every evening.     sertraline 25 MG tablet  Commonly known as:  ZOLOFT  Take 25 mg by mouth every morning.     simethicone 40 MG/0.6ML drops  Commonly known as:  MYLICON  Take 40 mg by mouth 4 (four) times daily as needed for flatulence.     simvastatin 40 MG tablet  Commonly known as:  ZOCOR  Take 40 mg by mouth every morning.     Travoprost (BAK Free) 0.004 % Soln ophthalmic solution  Commonly known as:  TRAVATAN  Place 1 drop into the left eye every evening.     vitamin B-12 500 MCG tablet  Commonly known as:  CYANOCOBALAMIN  Take 500 mcg by mouth every morning.        Discharge Instructions: Please refer to Patient Instructions section of EMR for full details.  Patient was counseled  important signs and symptoms that should prompt return to medical care, changes in medications, dietary instructions, activity restrictions, and follow up appointments.   Follow-Up Appointments: Follow-up Information   Schedule an appointment as soon as possible for a visit with HARPER,SCOTT. (For PCP follow up)    Contact information:   [place] [address] [phone]       Schedule an appointment as soon as possible for a visit with Beryle Beams, MD. (diverticular bleeding)    Specialty:  Gastroenterology   Contact information:   [place], [place] [address] [place], DO 07/07/2014, 1:37 PM PGY-1, [place]  The above was written primarily by Dr. Luiz Blare, DO and completed / edited for clarification by me. Emmaline Kluver, MD PGY-3, [facility] 07/08/2014, 12:51 PM

## 2014-07-07 NOTE — Progress Notes (Signed)
UR complete.  Tery Hoeger RN, MSN 

## 2014-07-07 NOTE — Progress Notes (Signed)
Patient doing well today, more awake and alert, denies any stomach pain, no N/V. Had some episodes of clot in her stool this morning however no gross rectal bleed. Hgb remains stable at around 12. We will hold anticoagulant for now, she might benefit from ASA 81mg  for her Afib instead. Monitor v/s and CBC for now. Plan SNF discharge once stable.

## 2014-07-07 NOTE — Progress Notes (Signed)
Patient ID: Kathleen Mccarty, female   DOB: [birth year], 80 y.o.   MRN: [id number]  Family Medicine Teaching Service Daily Progress Note Intern Pager: [phone]  Patient name: Kathleen Mccarty Medical record number: [id number] Date of birth: November 10, 1918 Age: 80 y.o. Gender: female  Primary Care Provider: HARPER,SCOTT Consultants: Neuro, PT/OT/SLP Code Status: DNR  Pt Overview and Major Events to Date:  8/2- admitted for acute encephalopathy and inability to speak  8/3 - able to verbalize and follow commands 8/4 -  BRBPR; [place] (4PVCs) on monitoring this morning 8/5 -  Still having Hematochezia  Assessment and Plan: Kathleen Mccarty is a 80 y.o. female who presented with acute encephalopathy and inability to speak. PMH is significant for a-fib, remote strokes noted on MRI 05/31/14, HTN, HLD, DM type 2, hypothyroidism, CAD (with implanted "heart monitor" in place since 2011, apparently MRI-safe), CHF, and recently-diagnosed depression and 'panic attacks'.  #Acute encephalopathy - working diagnosis of stroke (?subacute, though onset of symptoms abrupt) or seizure; patient improved at baseline mental status currently   - SLP / OT / PT ordered -- was cleared by speech; PT saw patient recommend continued PT services  -discussion with family about SNF by Education officer, museum; patient to go to [facility] - blood Cx NGTD and urine CX needs to be recollected(contaminated) - MRI showed no acute findings -continue to monitor mental status  -neuro has signed off on patient  #Hematochezia - likely diverticular bleed; improved. No more BRBPR only trace blood on stool -GI consulted and following patient; they are okay with patient being d/c to SNF since Hgb stable - monitor patients hemodynamic status and transfuse if needed -On CT diverticula were noted -Heparin drip, ASA, and coumadin discontinued at this time - discussed no further anticoagulation. However, will put her on ASA when bleeding  stops. -will monitor closely - Recheck CBC AM  #HTN - BP mildly elevated, which may be baseline and/or age-appropriate  - only on metoprolol and Imdur at home  - metoprolol PO  -Imdur being held at this time  - monitor BP, hydralazine prn with parameters(SBP >160, DBP >110)   #DM type II - A1c 6.7 - qAC+HS   CARDIAC  #CAD / CHF - no apparent fluid overload on exam or CXR, on metoprolol, Imdur, and Lasix at home  #Hx of implantable "cardiac monitor" - uncertain type but NOT pacer / defibrillator, apparently MRI-safe  #Atrial fibrillation - on ASA / warfarin at home but subtherapeutic; rate controlled currently  #HLD - on Zocor 40 mg at home  -on ASA, statin, lasix, and Imdur  -limit fluids IV as much as possible  - metoprolol PO 50mg  daily - consider echo, carotid dopplers, etc, as above pending change in status   - patient not on any anticoagulation at this time for A.fib due to bleed   #Hypothyroidism - last TSH WNL -Synthroid 112 mcg    #GERD - continue Protonix PO 20mg   #Macular degeneration, blindness in left eye - continue home eye drops (Cosopt, Xalatan for home Travatan)   FEN/GI: Regular diet(soft); Saline locked  Prophylaxis: None due to bleed  Disposition:  Continue current management plan as above, monitor for continued bleeding, will plan discharge to SNF for tomorrow   Subjective:  Patient doing well today. No more episodes of gross hematochezia. Only trace blood seen in stool. Patient and her family are requesting that the patient stay one more day to follow Hgb and for social reasons.  Objective: Temp:  [  98 F (36.7 C)-98.8 F (37.1 C)] 98.1 F (36.7 C) (08/06 0513) Pulse Rate:  [65-88] 88 (08/06 0513) Resp:  [16-18] 18 (08/06 0513) BP: (119-162)/(58-97) 162/93 mmHg (08/06 0513) SpO2:  [94 %-97 %] 97 % (08/06 0513) Physical Exam: General: elderly female lying in bed, in NAD,  alert HEENT: [place]/AT, MMM, OMI Cardiovascular: irregularly irregular but a  normal rate; loud blowing crescendo / decrescendo murmur loudest at upper sternal borders, without friction rub; Distal pulses intact bilaterally in LE  Respiratory: CTAB anteriorally and laterally; pt unable to sit up or roll for posterior auscultation, normal work of breathing Abdomen: soft, ND, BS+ , no hepatosplenomegaly, Tender to palpation in LLQ. Extremities: warm, well-perfused; chronic lower-extremity skin changes bilaterally (see below),area around ankles very tender to palpation. Skin: warm, dry, intact; prominent congested veins in bilateral feet but no obvious stasis dermatitis-type changes Neuro: No focal deficits on exam. A&Ox3.  Laboratory: CBC Latest Ref Rng 07/06/2014 07/06/2014 07/05/2014  WBC 4.0 - 10.5 K/uL 7.0 6.7 7.0  Hemoglobin 12.0 - 15.0 g/dL 12.2 12.6 12.6  Hematocrit 36.0 - 46.0 % 36.1 37.0 37.0  Platelets 150 - 400 K/uL 155 155 159   BMET    Component Value Date/Time   Kathleen 143 07/05/2014 1100   K 3.7 07/05/2014 1100   CL 107 07/05/2014 1100   CO2 24 07/05/2014 1100   GLUCOSE 155* 07/05/2014 1100   BUN 10 07/05/2014 1100   CREATININE 0.59 07/05/2014 1100   CALCIUM 8.7 07/05/2014 1100   GFRNONAA 77* 07/05/2014 1100   GFRAA 89* 07/05/2014 1100   TSH 1.310 C. Diff - negative UA 8/2: cloudy but otherwise normal  PT/INR = 20.2/1.72  Imaging/Diagnostic Tests: EKG: atrial fibrillation, nonspecific T-wave changes throughout, poor R-wave progression, normal rate; otherwise nonischemic, unchanged from previous CXR 8/2 @1121 : left-sided implanted device, no effusions; subjective osteopenia per radiologist; no acute cardio-pulmonary abnormality noted    Kathleen Shams, DO 07/07/2014, 07:30AM PGY-1, [place] Intern pager: [phone], text pages welcome

## 2014-07-07 NOTE — Discharge Instructions (Addendum)
Instructions to SNF: Patient will benefit from continued PT / OT evaluations. She should follow up with Gi if she has any further concerns for GI bleeding.  Discharge Date: 07/07/2014  Reason for hospitalization: You were hospitalized due to an altered mental status and inability to verbalize. A work up for stroke was done that showed no new changes. At time of discharge mental status improved and stroke was ruled out. You also had an episode of bleeding per rectum during this stay believed to be from a diverticular bleed. Bleeding may reoccur with diverticular disease and the only thing to do is monitor it and make sure that your blood levels are stable.  You will be discharged to a skilled nursing facility.  You should call your PCP to schedule a follow-up appointment.  Thank you for letting us participate in your care!

## 2014-07-07 NOTE — Progress Notes (Signed)
FMTS ATTENDING  NOTE Kathleen Canizales,MD I  have seen and examined this patient, reviewed their chart. I have discussed this patient with the resident. I agree with the resident's findings, assessment and care plan. 

## 2014-07-07 NOTE — Clinical Social Work Note (Signed)
CSW advised by [facility] that patient is currently not ready for discharge to SNF due to some bleeding. Updated [facility] SN[facility] where patient plans to go at discharge.  CSW will follow-  Eduard Clos, MSW, [place]

## 2014-07-07 NOTE — Progress Notes (Signed)
Subjective: No further bleeding last evening.  Only a total of 2 bloody bowel movements yesterday.  Objective: Vital signs in last 24 hours: Temp:  [98 F (36.7 C)-98.8 F (37.1 C)] 98.1 F (36.7 C) (08/06 0513) Pulse Rate:  [65-88] 88 (08/06 0513) Resp:  [16-18] 18 (08/06 0513) BP: (119-162)/(58-97) 162/93 mmHg (08/06 0513) SpO2:  [94 %-97 %] 97 % (08/06 0513) Last BM Date: 07/05/14  Intake/Output from previous day:   Intake/Output this shift:    General appearance: alert and no distress GI: soft, non-tender; bowel sounds normal; no masses,  no organomegaly  Lab Results:  Recent Labs  07/05/14 1306 07/06/14 0522 07/06/14 1416  WBC 7.0 6.7 7.0  HGB 12.6 12.6 12.2  HCT 37.0 37.0 36.1  PLT 159 155 155   BMET  Recent Labs  07/05/14 1100  NA 143  K 3.7  CL 107  CO2 24  GLUCOSE 155*  BUN 10  CREATININE 0.59  CALCIUM 8.7   LFT No results found for this basename: PROT, ALBUMIN, AST, ALT, ALKPHOS, BILITOT, BILIDIR, IBILI,  in the last 72 hours PT/INR  Recent Labs  07/05/14 1100 07/06/14 0522  LABPROT 22.0* 20.2*  INR 1.92* 1.72*   Hepatitis Panel No results found for this basename: HEPBSAG, HCVAB, HEPAIGM, HEPBIGM,  in the last 72 hours C-Diff No results found for this basename: CDIFFTOX,  in the last 72 hours Fecal Lactopherrin No results found for this basename: FECLLACTOFRN,  in the last 72 hours  Studies/Results: No results found.  Medications:  Scheduled: . dorzolamide-timolol  1 drop Left Eye BID  . insulin aspart  0-9 Units Subcutaneous TID AC & HS  . latanoprost  1 drop Left Eye QHS  . levothyroxine  112 mcg Oral QAC breakfast  . metoprolol succinate  50 mg Oral q morning - 10a  . nystatin   Topical BID  . pantoprazole  20 mg Oral Daily  . sodium chloride  3 mL Intravenous Q12H   Continuous: . sodium chloride 100 mL/hr at 07/06/14 2015    Assessment/Plan: 1) Presumed diverticular bleed. 2) AMS - resolved.   It appears that she  is stable and improving from the bleeding standpoint.  Her HGB is pending this AM, but there was no significant change.  Plan: 1) Continue supportive care. 2) Follow HGB and transfuse as necessary.   LOS: 4 days   Takeela Peil D 07/07/2014, 7:20 AM

## 2014-07-08 LAB — CBC
HCT: 34.4 % — ABNORMAL LOW (ref 36.0–46.0)
HEMOGLOBIN: 11.5 g/dL — AB (ref 12.0–15.0)
MCH: 30.3 pg (ref 26.0–34.0)
MCHC: 33.4 g/dL (ref 30.0–36.0)
MCV: 90.8 fL (ref 78.0–100.0)
Platelets: 141 10*3/uL — ABNORMAL LOW (ref 150–400)
RBC: 3.79 MIL/uL — AB (ref 3.87–5.11)
RDW: 14.4 % (ref 11.5–15.5)
WBC: 6.8 10*3/uL (ref 4.0–10.5)

## 2014-07-08 LAB — GLUCOSE, CAPILLARY
GLUCOSE-CAPILLARY: 137 mg/dL — AB (ref 70–99)
Glucose-Capillary: 126 mg/dL — ABNORMAL HIGH (ref 70–99)

## 2014-07-08 LAB — PROTIME-INR
INR: 1.42 (ref 0.00–1.49)
PROTHROMBIN TIME: 17.4 s — AB (ref 11.6–15.2)

## 2014-07-08 NOTE — Discharge Summary (Signed)
FMTS ATTENDING  NOTE Lanae Federer,MD I  have seen and examined this patient, reviewed their chart. I have discussed this patient with the resident. I agree with the resident's findings, assessment and care plan. 

## 2014-07-08 NOTE — Progress Notes (Signed)
Subjective: No further bleeding in the past 24 hours.  Objective: Vital signs in last 24 hours: Temp:  [97.5 F (36.4 C)-98.4 F (36.9 C)] 97.9 F (36.6 C) (08/07 0642) Pulse Rate:  [77-97] 80 (08/07 0642) Resp:  [18] 18 (08/07 0642) BP: (119-163)/(64-85) 163/84 mmHg (08/07 0642) SpO2:  [94 %-98 %] 96 % (08/07 0642) Last BM Date: 07/07/14  Intake/Output from previous day:   Intake/Output this shift:    General appearance: alert and no distress GI: soft, non-tender; bowel sounds normal; no masses,  no organomegaly  Lab Results:  Recent Labs  07/06/14 1416 07/07/14 0721 07/08/14 0442  WBC 7.0 6.5 6.8  HGB 12.2 12.4 11.5*  HCT 36.1 37.5 34.4*  PLT 155 133* 141*   BMET  Recent Labs  07/05/14 1100  NA 143  K 3.7  CL 107  CO2 24  GLUCOSE 155*  BUN 10  CREATININE 0.59  CALCIUM 8.7   LFT No results found for this basename: PROT, ALBUMIN, AST, ALT, ALKPHOS, BILITOT, BILIDIR, IBILI,  in the last 72 hours PT/INR  Recent Labs  07/07/14 0721 07/08/14 0442  LABPROT 19.1* 17.4*  INR 1.60* 1.42   Hepatitis Panel No results found for this basename: HEPBSAG, HCVAB, HEPAIGM, HEPBIGM,  in the last 72 hours C-Diff No results found for this basename: CDIFFTOX,  in the last 72 hours Fecal Lactopherrin No results found for this basename: FECLLACTOFRN,  in the last 72 hours  Studies/Results: No results found.  Medications:  Scheduled: . dorzolamide-timolol  1 drop Left Eye BID  . insulin aspart  0-9 Units Subcutaneous TID AC & HS  . latanoprost  1 drop Left Eye QHS  . levothyroxine  112 mcg Oral QAC breakfast  . metoprolol succinate  50 mg Oral q morning - 10a  . nystatin   Topical BID  . pantoprazole  20 mg Oral Daily  . sodium chloride  3 mL Intravenous Q12H   Continuous: . sodium chloride 50 mL/hr at 07/07/14 0930    Assessment/Plan: 1) Diverticular bleed.   No further bleeding.  I think she can be safely discharged home.  She can follow up with her  PCP.  Plan: 1) Okay to D/C home. 2) Signing off.   LOS: 5 days   Keyli Duross D 07/08/2014, 9:46 AM

## 2014-07-08 NOTE — Progress Notes (Signed)
Discharge orders received.  Pt IV removed and education complete.  Report called to [facility].  Awaiting transportation.  Will continue to monitor. Cori Razor, RN 07/08/2014 1:58 PM

## 2014-07-08 NOTE — Clinical Social Work Note (Signed)
Patient for d/c today to SNF bed at [facility] today- SNF auth rec'd [id number]). Family and patient agreeable to this plan- patient is visiting with her family along with her 71 month old great grandson and seems content and pleased with plans- Daughter [name] lives very near [place] so they are extrememly happy with the plans- will plan transfer via EMS. Eduard Clos, MSW, [place]

## 2014-07-08 NOTE — Progress Notes (Signed)
Transported out via stretcher.  VSS upon discharge. Cori Razor, RN 07/08/2014 2:51 PM

## 2014-07-09 LAB — CULTURE, BLOOD (ROUTINE X 2)
Culture: NO GROWTH
Culture: NO GROWTH

## 2014-07-12 ENCOUNTER — Encounter (HOSPITAL_COMMUNITY): Payer: Self-pay | Admitting: Emergency Medicine

## 2014-07-12 ENCOUNTER — Emergency Department (HOSPITAL_COMMUNITY): Payer: Medicare HMO

## 2014-07-12 ENCOUNTER — Emergency Department (HOSPITAL_COMMUNITY)
Admission: EM | Admit: 2014-07-12 | Discharge: 2014-07-12 | Disposition: A | Discharge status: Home / Self Care | Payer: Medicare HMO | Attending: Family Medicine | Admitting: Family Medicine

## 2014-07-12 DIAGNOSIS — E785 Hyperlipidemia, unspecified: Secondary | ICD-10-CM | POA: Insufficient documentation

## 2014-07-12 DIAGNOSIS — Z7982 Long term (current) use of aspirin: Secondary | ICD-10-CM | POA: Insufficient documentation

## 2014-07-12 DIAGNOSIS — I4891 Unspecified atrial fibrillation: Secondary | ICD-10-CM | POA: Insufficient documentation

## 2014-07-12 DIAGNOSIS — Z8589 Personal history of malignant neoplasm of other organs and systems: Secondary | ICD-10-CM | POA: Insufficient documentation

## 2014-07-12 DIAGNOSIS — E119 Type 2 diabetes mellitus without complications: Secondary | ICD-10-CM | POA: Diagnosis not present

## 2014-07-12 DIAGNOSIS — R1013 Epigastric pain: Secondary | ICD-10-CM | POA: Insufficient documentation

## 2014-07-12 DIAGNOSIS — R079 Chest pain, unspecified: Secondary | ICD-10-CM | POA: Insufficient documentation

## 2014-07-12 DIAGNOSIS — Z9889 Other specified postprocedural states: Secondary | ICD-10-CM | POA: Diagnosis not present

## 2014-07-12 DIAGNOSIS — Z79899 Other long term (current) drug therapy: Secondary | ICD-10-CM | POA: Diagnosis not present

## 2014-07-12 DIAGNOSIS — K219 Gastro-esophageal reflux disease without esophagitis: Secondary | ICD-10-CM | POA: Diagnosis not present

## 2014-07-12 DIAGNOSIS — G8929 Other chronic pain: Secondary | ICD-10-CM | POA: Diagnosis not present

## 2014-07-12 DIAGNOSIS — E079 Disorder of thyroid, unspecified: Secondary | ICD-10-CM | POA: Diagnosis not present

## 2014-07-12 DIAGNOSIS — Z8673 Personal history of transient ischemic attack (TIA), and cerebral infarction without residual deficits: Secondary | ICD-10-CM | POA: Diagnosis not present

## 2014-07-12 DIAGNOSIS — H409 Unspecified glaucoma: Secondary | ICD-10-CM | POA: Insufficient documentation

## 2014-07-12 DIAGNOSIS — I10 Essential (primary) hypertension: Secondary | ICD-10-CM | POA: Insufficient documentation

## 2014-07-12 DIAGNOSIS — R197 Diarrhea, unspecified: Secondary | ICD-10-CM | POA: Insufficient documentation

## 2014-07-12 DIAGNOSIS — Z880 Allergy status to penicillin: Secondary | ICD-10-CM | POA: Insufficient documentation

## 2014-07-12 DIAGNOSIS — M129 Arthropathy, unspecified: Secondary | ICD-10-CM | POA: Diagnosis not present

## 2014-07-12 DIAGNOSIS — I509 Heart failure, unspecified: Secondary | ICD-10-CM | POA: Insufficient documentation

## 2014-07-12 LAB — I-STAT CHEM 8, ED
BUN: 10 mg/dL (ref 6–23)
Calcium, Ion: 1.16 mmol/L (ref 1.13–1.30)
Chloride: 108 mEq/L (ref 96–112)
Creatinine, Ser: 0.6 mg/dL (ref 0.50–1.10)
Glucose, Bld: 137 mg/dL — ABNORMAL HIGH (ref 70–99)
HEMATOCRIT: 31 % — AB (ref 36.0–46.0)
Hemoglobin: 10.5 g/dL — ABNORMAL LOW (ref 12.0–15.0)
POTASSIUM: 3.5 meq/L — AB (ref 3.7–5.3)
Sodium: 144 mEq/L (ref 137–147)
TCO2: 23 mmol/L (ref 0–100)

## 2014-07-12 LAB — CBC WITH DIFFERENTIAL/PLATELET
BASOS ABS: 0 10*3/uL (ref 0.0–0.1)
BASOS PCT: 1 % (ref 0–1)
Eosinophils Absolute: 0.2 10*3/uL (ref 0.0–0.7)
Eosinophils Relative: 4 % (ref 0–5)
HCT: 33.3 % — ABNORMAL LOW (ref 36.0–46.0)
Hemoglobin: 11.1 g/dL — ABNORMAL LOW (ref 12.0–15.0)
Lymphocytes Relative: 21 % (ref 12–46)
Lymphs Abs: 1.2 10*3/uL (ref 0.7–4.0)
MCH: 31.1 pg (ref 26.0–34.0)
MCHC: 33.3 g/dL (ref 30.0–36.0)
MCV: 93.3 fL (ref 78.0–100.0)
Monocytes Absolute: 0.5 10*3/uL (ref 0.1–1.0)
Monocytes Relative: 10 % (ref 3–12)
NEUTROS ABS: 3.5 10*3/uL (ref 1.7–7.7)
NEUTROS PCT: 64 % (ref 43–77)
Platelets: 148 10*3/uL — ABNORMAL LOW (ref 150–400)
RBC: 3.57 MIL/uL — ABNORMAL LOW (ref 3.87–5.11)
RDW: 14.4 % (ref 11.5–15.5)
WBC: 5.4 10*3/uL (ref 4.0–10.5)

## 2014-07-12 LAB — COMPREHENSIVE METABOLIC PANEL
ALT: 10 U/L (ref 0–35)
AST: 13 U/L (ref 0–37)
Albumin: 3 g/dL — ABNORMAL LOW (ref 3.5–5.2)
Alkaline Phosphatase: 62 U/L (ref 39–117)
Anion gap: 11 (ref 5–15)
BILIRUBIN TOTAL: 0.6 mg/dL (ref 0.3–1.2)
BUN: 11 mg/dL (ref 6–23)
CO2: 25 mEq/L (ref 19–32)
CREATININE: 0.58 mg/dL (ref 0.50–1.10)
Calcium: 8.7 mg/dL (ref 8.4–10.5)
Chloride: 109 mEq/L (ref 96–112)
GFR calc Af Amer: 89 mL/min — ABNORMAL LOW (ref >90)
GFR calc non Af Amer: 77 mL/min — ABNORMAL LOW (ref >90)
Glucose, Bld: 134 mg/dL — ABNORMAL HIGH (ref 70–99)
Potassium: 3.6 mEq/L — ABNORMAL LOW (ref 3.7–5.3)
Sodium: 145 mEq/L (ref 137–147)
TOTAL PROTEIN: 5.7 g/dL — AB (ref 6.0–8.3)

## 2014-07-12 LAB — I-STAT TROPONIN, ED: TROPONIN I, POC: 0.01 ng/mL (ref 0.00–0.08)

## 2014-07-12 LAB — CK: Total CK: 58 U/L (ref 7–177)

## 2014-07-12 LAB — URINALYSIS, ROUTINE W REFLEX MICROSCOPIC
Bilirubin Urine: NEGATIVE
Glucose, UA: NEGATIVE mg/dL
Hgb urine dipstick: NEGATIVE
KETONES UR: NEGATIVE mg/dL
Leukocytes, UA: NEGATIVE
Nitrite: NEGATIVE
PH: 5 (ref 5.0–8.0)
Protein, ur: NEGATIVE mg/dL
SPECIFIC GRAVITY, URINE: 1.014 (ref 1.005–1.030)
Urobilinogen, UA: 0.2 mg/dL (ref 0.0–1.0)

## 2014-07-12 LAB — LACTIC ACID, PLASMA: Lactic Acid, Venous: 1 mmol/L (ref 0.5–2.2)

## 2014-07-12 LAB — LIPASE, BLOOD: Lipase: 19 U/L (ref 11–59)

## 2014-07-12 LAB — PRO B NATRIURETIC PEPTIDE: PRO B NATRI PEPTIDE: 1468 pg/mL — AB (ref 0–450)

## 2014-07-12 MED ORDER — PANTOPRAZOLE SODIUM 40 MG PO TBEC: 40.0000 mg | DELAYED_RELEASE_TABLET | Freq: Every day | ORAL | Status: DC

## 2014-07-12 MED ORDER — GI COCKTAIL ~~LOC~~
30.0000 mL | Freq: Once | ORAL | Status: AC
  Administered 2014-07-12: 30 mL via ORAL
  Filled 2014-07-12: qty 30

## 2014-07-12 MED ORDER — FUROSEMIDE 10 MG/ML IJ SOLN
20.0000 mg | Freq: Once | INTRAMUSCULAR | Status: AC
  Administered 2014-07-12: 20 mg via INTRAVENOUS
  Filled 2014-07-12: qty 2

## 2014-07-12 MED ORDER — POTASSIUM CHLORIDE CRYS ER 20 MEQ PO TBCR
40.0000 meq | EXTENDED_RELEASE_TABLET | Freq: Once | ORAL | Status: AC
  Administered 2014-07-12: 40 meq via ORAL
  Filled 2014-07-12: qty 2

## 2014-07-12 MED ORDER — PANTOPRAZOLE SODIUM 40 MG PO TBEC
40.0000 mg | DELAYED_RELEASE_TABLET | Freq: Once | ORAL | Status: AC
  Administered 2014-07-12: 40 mg via ORAL
  Filled 2014-07-12: qty 1

## 2014-07-12 NOTE — ED Notes (Signed)
Patient states she was awakened with chest pain , back pain and lower extremity pain, States she was here about a week ago with same

## 2014-07-12 NOTE — ED Provider Notes (Signed)
CSN: [id number]     Arrival date & time 07/12/14  0600 History   First MD Initiated Contact with Patient 07/12/14 [phone]   History provided primarily by patient, additional history provided by patient's daughter at bedside.   Chief Complaint  Patient presents with  . Chest Pain   HPI  Patient reports complaint of epigastric abdominal pain that started last night, currently with some improvement. Described as "aching, pressure", unclear what makes it worse, denies relationship to food or movement. Currently admits to mostly resolved pain unless touching abdomen, on palpation described as "sharp" without radiation, abdominal pain is associated with "feeling gassy, bloated, and burping". Denies any recent fever/chills, radiation of pain to back, nausea or vomiting, diarrhea, bloody stool (denies any blood in stool since last hospitalization). Admits constipation x 2 days (normal BM daily), dizziness on standing, urinating well. Did not receive medicines today.   Further history provided that patient has been having similar chronic chest and abdominal pain over past several months. Additional complaints with intermittent chronic right hip pain extending down to right knee and lower leg, both ankles remain tender and with some increased swelling over past 24 hours. Currently working with physical therapy (previously only working on upper extremities), and recently just completed full body physical therapy with stationary bike riding, which patient attributes bilateral leg pain to.  Significant recent history with admission at [facility] from 07/03/14 through 07/08/14 for Altered Mental Status, at that time without any identified etiology, imaging was negative including MRI, patient had returned to baseline and discharged to SNF. Additionally, had rectal bleeding, followed by GI, suspected to be diverticular bleed on CT scan, resumed ASA only and held anticoagulation.  Currently resident at [facility]  SNF (since 07/08/14), gradual functional decline over past several months, followed by Dr. Thalia Party ([facility]), not currently followed by Cardiology. Recent decreased appetite, drinks increased amounts of coffee and less water, making good amounts of urine.  Past Medical History  Diagnosis Date  . Dysrhythmia   . Cancer   . Arthritis   . History of thyroidectomy   . Hypertension   . Hyperlipidemia   . Atrial fibrillation     on coumadin  . CHF (congestive heart failure)   . Stroke   . Diabetes mellitus without complication   . Thyroid disease     hypo  . Glaucoma    Past Surgical History  Procedure Laterality Date  . Breast surgery    . Abdominal hysterectomy     History reviewed. No pertinent family history. History  Substance Use Topics  . Smoking status: Never Smoker   . Smokeless tobacco: Never Used  . Alcohol Use: No   OB History   Grav Para Term Preterm Abortions TAB SAB Ect Mult Living                 Review of Systems  See above HPI  Allergies  Amoxicillin; Cephalosporins; Codeine; Eggs or egg-derived products; Iodine; Shellfish allergy; and Adhesive  Home Medications   Prior to Admission medications   Medication Sig Start Date End Date Taking? Authorizing Provider  acetaminophen (TYLENOL) 500 MG tablet Take 500 mg by mouth every 6 (six) hours as needed for mild pain (For pain.).    Yes Historical Provider, MD  alum & mag hydroxide-simeth (MAALOX/MYLANTA) 200-200-20 MG/5ML suspension Take 10 mLs by mouth every 6 (six) hours as needed for indigestion or heartburn (indigestion).   Yes Historical Provider, MD  aspirin EC 81 MG tablet  Take 1 tablet (81 mg total) by mouth daily after supper. Restart in 7 days 07/07/14  Yes Bernadene Bell, MD  docusate sodium (COLACE) 100 MG capsule Take 100 mg by mouth every other day.   Yes Historical Provider, MD  dorzolamide-timolol (COSOPT) 22.3-6.8 MG/ML ophthalmic solution Place 1 drop into the left eye 2 (two) times  daily.    Yes Historical Provider, MD  furosemide (LASIX) 20 MG tablet Take 20 mg by mouth every morning.    Yes Historical Provider, MD  isosorbide mononitrate (IMDUR) 60 MG 24 hr tablet Take 30 mg by mouth daily after supper.    Yes Historical Provider, MD  levothyroxine (SYNTHROID, LEVOTHROID) 112 MCG tablet Take 112 mcg by mouth daily before breakfast.    Yes Historical Provider, MD  metoprolol succinate (TOPROL-XL) 50 MG 24 hr tablet Take 1 tablet (50 mg total) by mouth daily after breakfast. Take with or immediately following a meal. 05/10/14  Yes Ripudeep K Rai, MD  Multiple Vitamins-Minerals (ICAPS MV) TABS Take 2 tablets by mouth 2 (two) times daily.    Yes Historical Provider, MD  nitroGLYCERIN (NITROSTAT) 0.4 MG SL tablet Place 0.4 mg under the tongue every 5 (five) minutes as needed for chest pain.    Yes Historical Provider, MD  pantoprazole (PROTONIX) 20 MG tablet Take 20 mg by mouth daily.    Yes Historical Provider, MD  ranitidine (ZANTAC) 150 MG tablet Take 150 mg by mouth every evening.   Yes Historical Provider, MD  sertraline (ZOLOFT) 25 MG tablet Take 25 mg by mouth every morning.  06/20/14  Yes Historical Provider, MD  simethicone (MYLICON) 40 VV/6.1YW drops Take 40 mg by mouth 4 (four) times daily as needed for flatulence.   Yes Historical Provider, MD  simvastatin (ZOCOR) 40 MG tablet Take 40 mg by mouth every morning.    Yes Historical Provider, MD  Travoprost, BAK Free, (TRAVATAN) 0.004 % SOLN ophthalmic solution Place 1 drop into the left eye every evening.    Yes Historical Provider, MD  vitamin B-12 (CYANOCOBALAMIN) 500 MCG tablet Take 500 mcg by mouth every morning.   Yes Historical Provider, MD   BP 140/72  Pulse 73  Temp(Src) 98 F (36.7 C) (Oral)  Resp 20  Ht 5\' 3"  (1.6 m)  Wt 167 lb (75.751 kg)  BMI 29.59 kg/m2  SpO2 97% Physical Exam  Gen - well-appearing, pleasant, cooperative, NAD HEENT - NCAT, PERRL, EOMI, oropharynx clear, dry MM and tongue Neck -  supple, non-tender Heart - irregularly irregular, normal rate, 2/6 systolic murmur heard Lungs - Good air movement and upper fields CTAB with mild +bibasilar crackles. Normal work of breathing. Speaks full sentences without resp distress. No supplemental O2 req. Abd - obese, soft, +epigastric tenderness > generalized mid and lower abdominal tenderness to palpation only, no rebound, no guarding, no masses, +active BS Ext - Right Hip: mild tenderness to palpation over trochanter, and lower leg calf / ankle with intact FROM hip and knee. Bilateral ankle +1 pitting edema, bilateral calves appear symmetrical without erythema or extending edema, peripheral pulses intact +2 b/l Skin - warm, dry, no rashes Neuro - awake, alert, oriented (name, daughter's name, place, year), grossly non-focal, intact muscle strength 5/5 b/l (mild chronic residual weakness Left UE/LE), intact distal sensation to light touch  ED Course  Procedures (including critical care time) Labs Review Labs Reviewed  CBC WITH DIFFERENTIAL - Abnormal; Notable for the following:    RBC 3.57 (*)    Hemoglobin  11.1 (*)    HCT 33.3 (*)    Platelets 148 (*)    All other components within normal limits  COMPREHENSIVE METABOLIC PANEL - Abnormal; Notable for the following:    Potassium 3.6 (*)    Glucose, Bld 134 (*)    Total Protein 5.7 (*)    Albumin 3.0 (*)    GFR calc non Af Amer 77 (*)    GFR calc Af Amer 89 (*)    All other components within normal limits  PRO B NATRIURETIC PEPTIDE - Abnormal; Notable for the following:    Pro B Natriuretic peptide (BNP) 1468.0 (*)    All other components within normal limits  I-STAT CHEM 8, ED - Abnormal; Notable for the following:    Potassium 3.5 (*)    Glucose, Bld 137 (*)    Hemoglobin 10.5 (*)    HCT 31.0 (*)    All other components within normal limits  URINALYSIS, ROUTINE W REFLEX MICROSCOPIC  LIPASE, BLOOD  LACTIC ACID, PLASMA  CK  I-STAT TROPOININ, ED    Imaging Review Dg  Chest 2 View  07/12/2014   CLINICAL DATA:  Chest pain.  EXAM: CHEST  2 VIEW  COMPARISON:  07/03/2014  FINDINGS: There is cardiomegaly with new pulmonary vascular congestion superimposed on chronic lung disease. There are small bilateral pleural effusions.  Chronic accentuation of the thoracic kyphosis.  IMPRESSION: Findings consistent with congestive heart failure.   Electronically Signed   By: Rozetta Nunnery M.D.   On: 07/12/2014 07:12     EKG Interpretation   Date/Time:  Tuesday July 12 2014 06:10:15 EDT Ventricular Rate:  74 PR Interval:    QRS Duration: 142 QT Interval:  422 QTC Calculation: 468 R Axis:   -26 Text Interpretation:  Atrial fibrillation Ventricular premature complex  Left bundle branch block Compared to previous tracing T wave inversion NOW  PRESENT Anteroseptal leads Confirmed by [facility]  MD, Jenny Reichmann ([id number]) on  07/12/2014 7:30:19 AM      MDM   Final diagnoses:  Epigastric pain  Gastroesophageal reflux disease without esophagitis   19 yr F with PMH DM, GERD, HTN, HLD, CHF (last ECHO 2012 with EF 55%), chronic AFib (currently not on anticoagulation 2/2 recent GI bleed), presents with epigastric abdominal pain / pressure, which appears to be improved, also with generalized lower abdominal sharp pain on palpation. Currently with intact baseline mental status, stable vitals, well-appearing and in no distress. Suspected etiology of abdominal pain is reflux with associated gas (bloating and burping), recently inc GERD regimen per PCP, no evidence of rectal bleeding, exam or hx not suggestive of cholecystitis or pancreatitis, however will work-up with labs and investigate for mesenteric ischemia. Unlikely cardiac, present / persisting > 12 hours, initial POCT-Trop negative, EKG with chronic AFib prior LBBB but now with some T-wave inversion anteroseptal leads. Possible mild inc fluid balance in setting of CHF, CXR shows inc vascularization consistent with CHF.  Proceed with  work-up in addition to Chem-I stat ordered CMET, CBC, Lipase, UA, lactate, ProBNP. Ordered GI cocktail, Protonix 40mg  PO, and Lasix 20mg  IV (did not receive home Lasix 20mg  today).  UPDATE @ [phone]- Patient reports improved s/p GI cocktail and protonix. Currently with reduced epigastric pain and on exam now only mild LLQ tenderness on palpation. Lab results with lactate 1.0 (normal and reassuring, unlikely mesenteric ischemia or sepsis), UA unremarkable (no signs of infection, and no Hgb or RBC, unlikely rhabdo). Added total CK (58, normal, unlikely rhabdo in setting  of bilateral inc lower ext muscle pain from inc physical therapy). CMET unremarkable LFTs, Lipase 19 nml, K 3.6 (ordered K PO 6mEq x 1), and nml Cr. CBC with nml WBC 5.4 and stable low Hgb 11.1. Discussed reassuring results, and consider patient medically stable and ready for discharge back to SNF, however patient and daughter clearly state their wishes to be discharged home and not return to SNF. Consulted CSW in ED for coordination of disposition.  UPDATE @ 1202 - Patient denies any changes. States that her pain is mostly resolved. Discussed with CSW Evelena Peat who contacted SNF and discussed with patient's family. Arrangements made per CSW for patient to be discharged to home, already set up home health (nursing and PT) upon discharge. Patient will be taken home by family in personal vehicle, and will not return to SNF. Return precautions discussed.  Nobie Putnam, DO 07/12/14 1228

## 2014-07-12 NOTE — ED Notes (Signed)
SOCIAL WORKER HAS BEEN IN AND ADVISES THAT PT IS READY FOR DISCHARGE. SHE HAS MADE THE NECESSARY CALLS AND ARRANGEMENTS FOR HER TO GO HOME WITH HER FAMILY

## 2014-07-12 NOTE — ED Notes (Signed)
Waiting to hear back from social work. Secretary and Resident have both left multiple messages. Currently called office to try and get in touch with social work. In meantime, will move pt to POD C to wait for social work.

## 2014-07-12 NOTE — Clinical Social Work Note (Signed)
Clinical Social Worker received notification from ED Network engineer through social work office at [employer] am regarding social work needs on patient behalf.  Patient resides at [facility], however does not want to return and patient daughter would like to take patient home.  CSW to follow up with patient and daughter at bedside to confirm patient discharge plans.    Barbette Or, [place]
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
## 2014-07-12 NOTE — Progress Notes (Addendum)
  CARE MANAGEMENT ED NOTE 07/12/2014  Patient:  Kathleen Mccarty, Kathleen Mccarty   Account Number:  [id number]  Date Initiated:  07/12/2014  Documentation initiated by:  Kathleen Mccarty  Subjective/Objective Assessment:   79 yr old Kathleen Mccarty pt awakened with chest pain , back pain and lower extremity pain, States she was here about a week ago with     Subjective/Objective Assessment Detail:   same  pcp Kathleen Mccarty  Daughter reports pt does not need DME Has Bedside commode, w/c , RW, shower stool. Reports pt had P/TOT, aide services through [facility] that had been stopped about "two weeks ago"  Home instead had been stopped  Daughter at bedside states her sister from [place] and she will be staying with pt until they make arrangements for an "assisted living facility"     Action/Plan:   ED CM received a consult from ED SW, Kathleen Mccarty requesting home health services for pt via [facility] Cm spoke with pt's daughter via telephone to offer choice but Kathleen Mccarty already chosen.  Cm confirmed correct address   Action/Plan Detail:   Anticipated DC Date:  07/12/2014     Status Recommendation to Physician:   Result of Recommendation:    Other ED [place]  Other  Outpatient Services - Pt will follow up   Kathleen Mccarty   Choice offered to / List presented to:  Kathleen Mccarty Adult Children     [place] arranged  HH-1 RN  [place]      [place] agency  [place]    Status of service:  Completed, signed off  ED Comments:   ED Comments Detail:  07/12/14 1439 Received fax confirmation for clinicals sent to [facility] fax [phone] called Kathleen Mccarty spoke with to call in referral for home health services

## 2014-07-12 NOTE — Discharge Instructions (Signed)
You were evaluated for abdominal pain. We believe that this is related to your GERD (reflux disease), as it responded well to the anti-acid medication (GI Cocktail, similar to Maalox, and your morning Protonix dose) Our labs showed that your heart test was normal, and the EKG did not show any new problems with your heart either. We think that some of your muscle pain is due to recent increased physical therapy. Our lab tests did not show any signs of infection or muscle damage. Recommend that you continue your current regimen detailed by your primary doctor, Dr. Jodi Mourning (continue with daily Protonix and night time Zantac), also the as needed medicines as prescribed.  Please follow-up with your primary doctor within 2-3 days if your symptoms do not improve, and continue routine follow-up as needed. If you develop significant worsening with chest pain, worsening abdominal pain, nausea / vomiting, shortness of breath, please call your doctor and seek immediate medical therapy, return to the Emergency Department for further evaluation if needed.  As discussed with the Clinical Social Worker - you will return to home today, and start Home Health arrangements.

## 2014-07-12 NOTE — ED Notes (Signed)
PATIENT MOVED TO POD C TO AWAIT SOCIAL WORK CONSULT

## 2014-07-13 NOTE — Progress Notes (Signed)
07/13/14  1338 ED CM called to f/u with pt Left a voice message requesting aa return call

## 2014-07-19 NOTE — ED Provider Notes (Signed)
I saw and evaluated the patient, reviewed the resident's note and I agree with the findings and plan.   EKG Interpretation   Date/Time:  Tuesday July 12 2014 06:10:15 EDT Ventricular Rate:  74 PR Interval:    QRS Duration: 142 QT Interval:  422 QTC Calculation: 468 R Axis:   -26 Text Interpretation:  Atrial fibrillation Ventricular premature complex  Left bundle branch block Compared to previous tracing T wave inversion NOW  PRESENT Anteroseptal leads Confirmed by [facility]  MD, Jenny Reichmann ([id number]) on  07/12/2014 7:30:19 AM     Baseline mental status and chronic pain, now with mild HF without MI.  Babette Relic, MD 07/19/14 [phone]

## 2015-10-19 IMAGING — CT CT HEAD W/O CM
1 series · 16 of 30 positions shown, 20 images · non-contrast
Comparison: 05/12/2014.

CLINICAL DATA: Fell.  Brief loss of consciousness.

EXAM:
CT HEAD WITHOUT CONTRAST
TECHNIQUE: Contiguous axial images were obtained from the base of the skull
through the vertex without intravenous contrast.

[Series 2: head 5.0 h30s · axial · 0.48mm/px · z∈[-94,+56]mm · 16 of 34 slices shown, 20 images]
[im 2/34  brain]
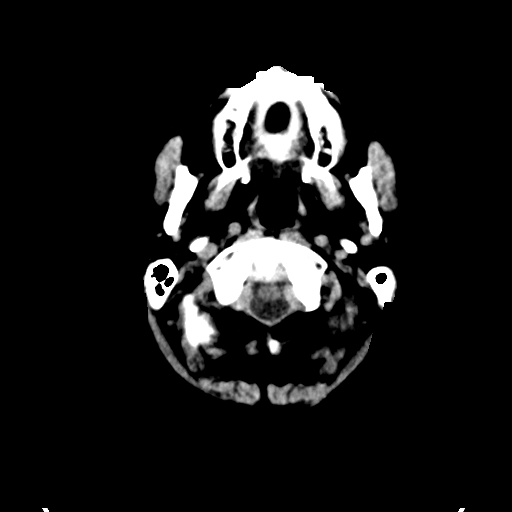
[im 2/34  bone]
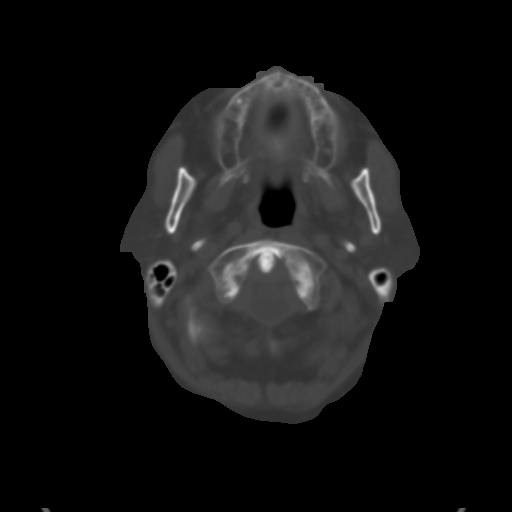
[im 4/34  brain]
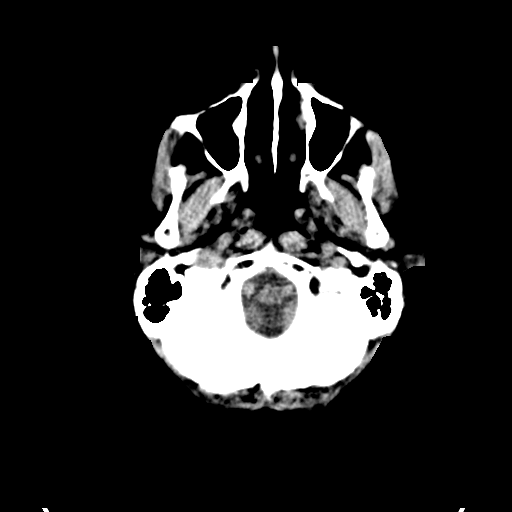
[im 6/34  brain]
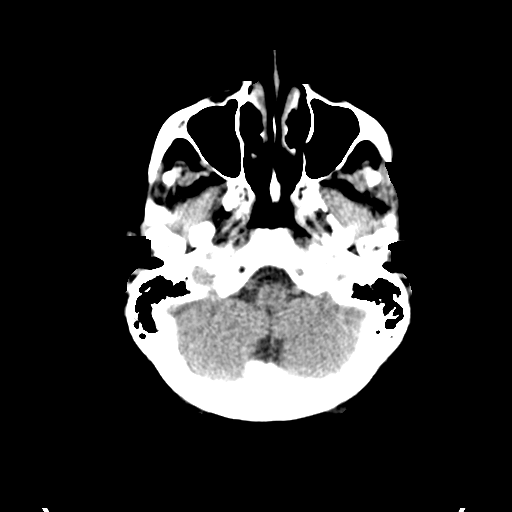
[im 8/34  brain]
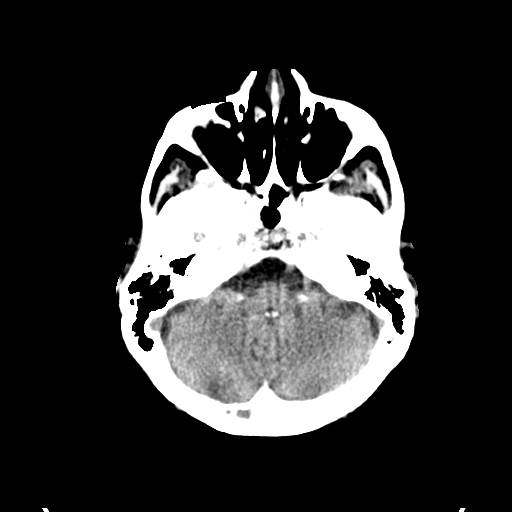
[im 10/34  brain]
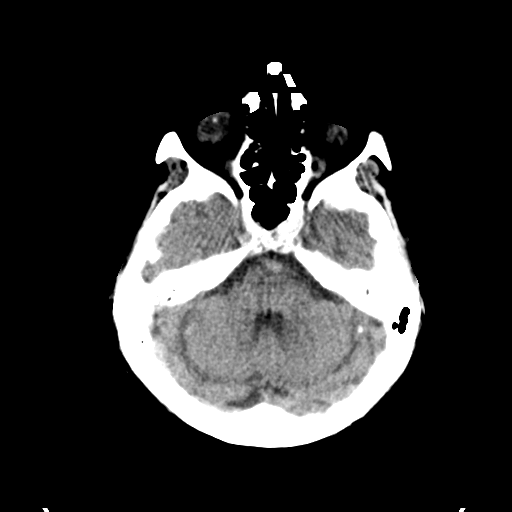
[im 10/34  bone]
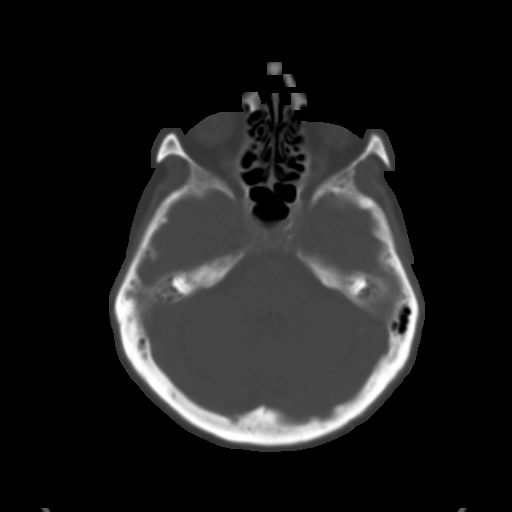
[im 12/34  brain]
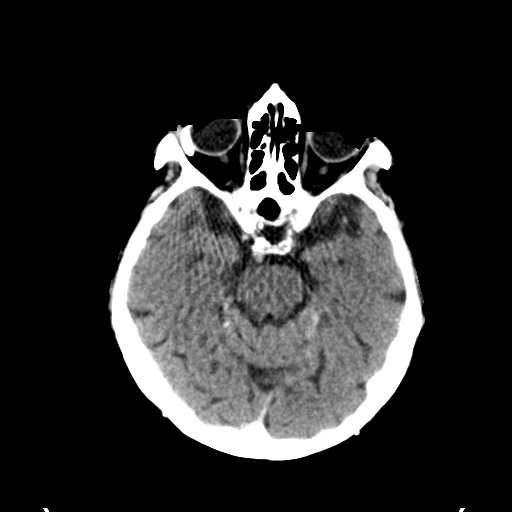
[im 14/34  brain]
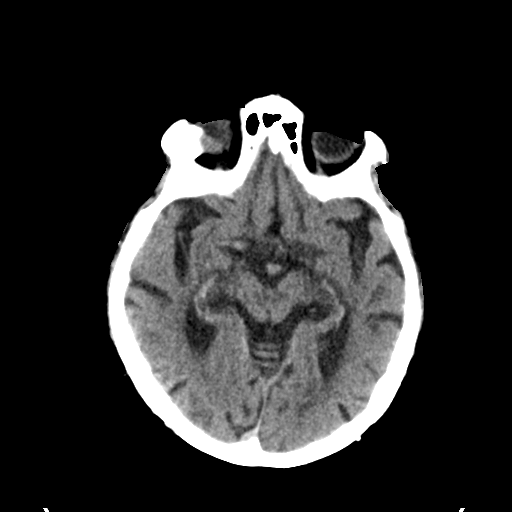
[im 16/34  brain]
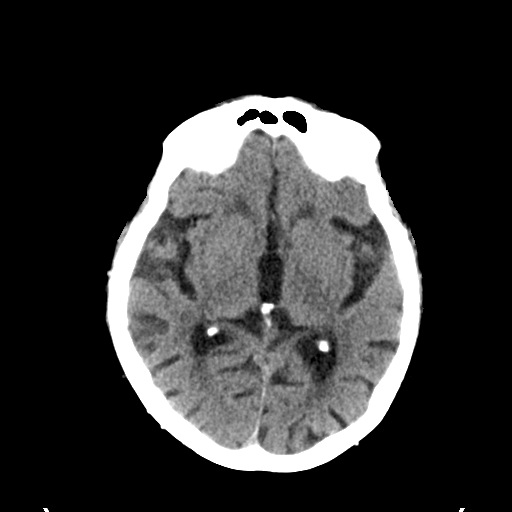
[im 18/34  brain]
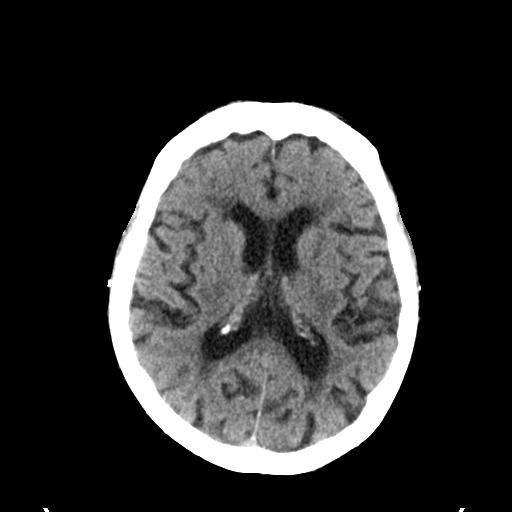
[im 18/34  bone]
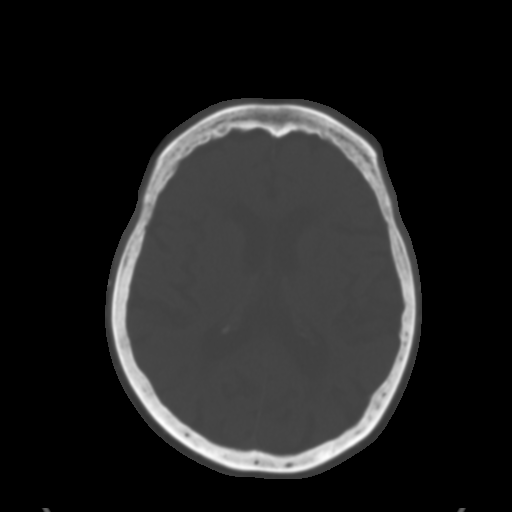
[im 20/34  brain]
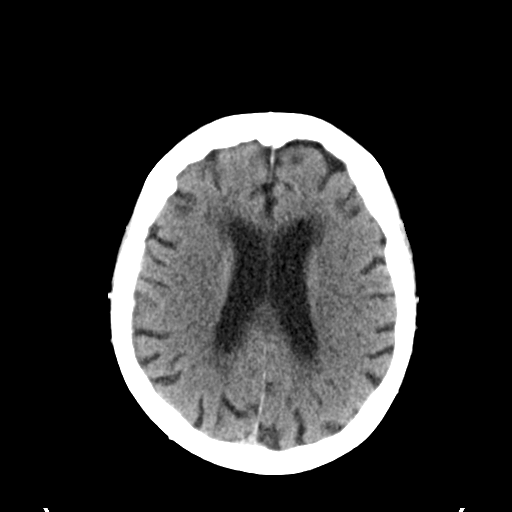
[im 22/34  brain]
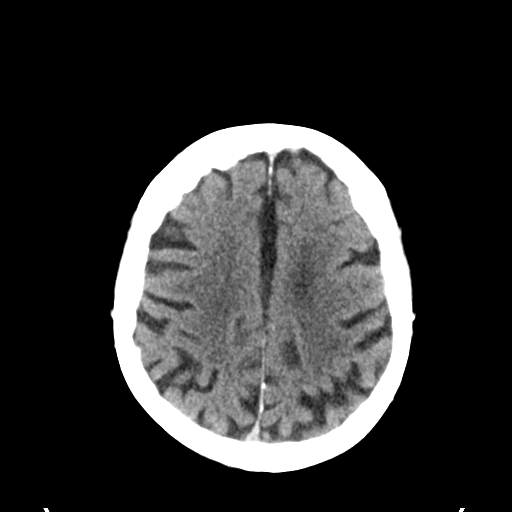
[im 24/34  brain]
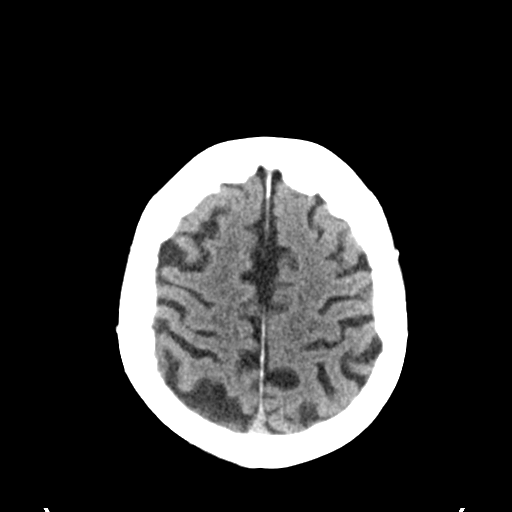
[im 26/34  brain]
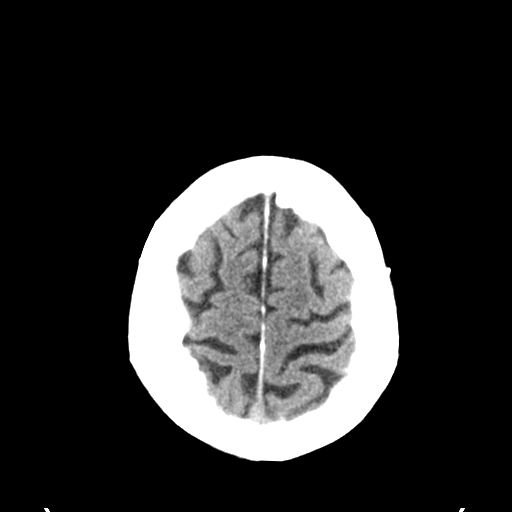
[im 26/34  bone]
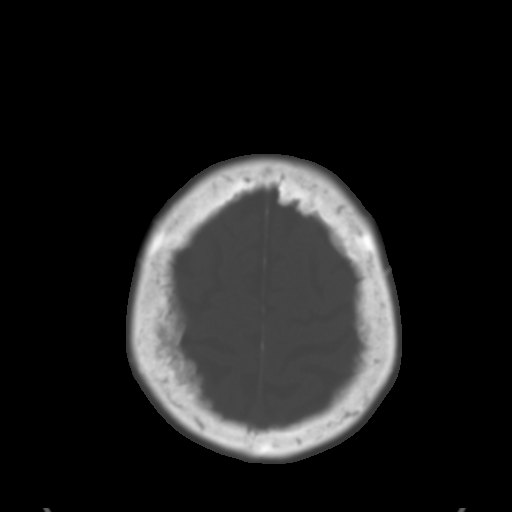
[im 28/34  brain]
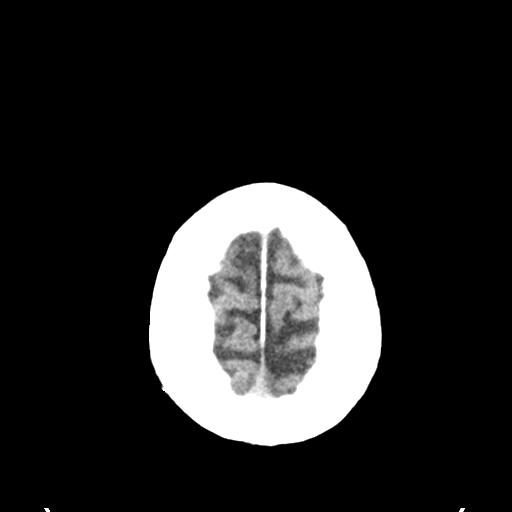
[im 30/34  brain]
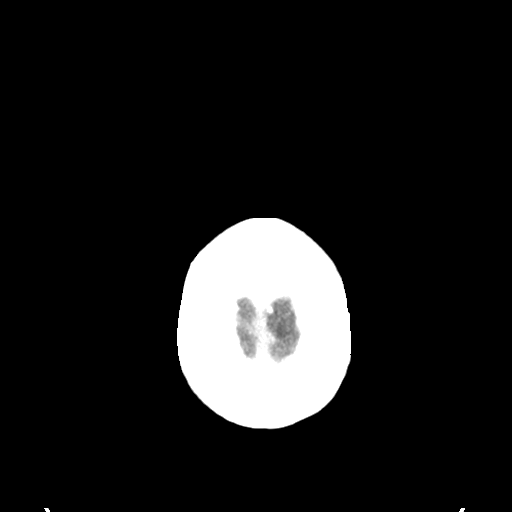
[im 32/34  brain]
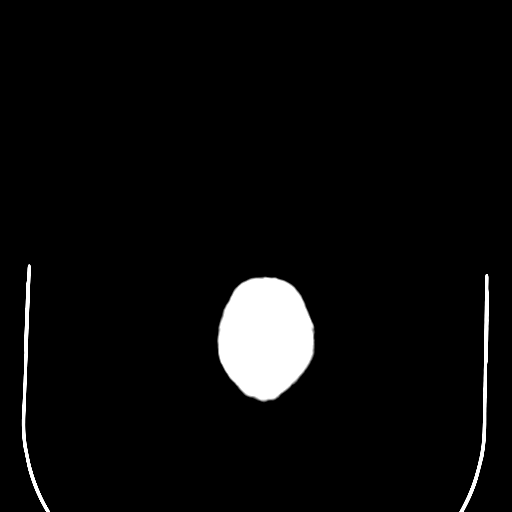

[16 of 30 positions shown; findings below may reference images not displayed]

FINDINGS: Diffusely enlarged ventricles and subarachnoid spaces. Patchy white
matter low density in both cerebral hemispheres. No skull fracture,
intracranial hemorrhage or paranasal sinus air-fluid levels.
IMPRESSION: 1. No acute abnormality.
2. Stable atrophy and chronic small vessel white matter ischemic
changes.

## 2017-12-02 DEATH — deceased
